# Patient Record
Sex: Female | Born: 1935 | Race: White | Hispanic: No | Marital: Married | Smoking: Former smoker
Health system: Southern US, Community
[De-identification: ages and names within clinical notes are randomized; demographics above are authoritative.]

## PROBLEM LIST (undated history)

## (undated) DIAGNOSIS — I341 Nonrheumatic mitral (valve) prolapse: Secondary | ICD-10-CM

## (undated) DIAGNOSIS — R002 Palpitations: Secondary | ICD-10-CM

## (undated) DIAGNOSIS — N200 Calculus of kidney: Secondary | ICD-10-CM

## (undated) DIAGNOSIS — I491 Atrial premature depolarization: Secondary | ICD-10-CM

## (undated) DIAGNOSIS — M25511 Pain in right shoulder: Secondary | ICD-10-CM

## (undated) DIAGNOSIS — E785 Hyperlipidemia, unspecified: Secondary | ICD-10-CM

## (undated) DIAGNOSIS — K5790 Diverticulosis of intestine, part unspecified, without perforation or abscess without bleeding: Secondary | ICD-10-CM

## (undated) DIAGNOSIS — K648 Other hemorrhoids: Secondary | ICD-10-CM

## (undated) DIAGNOSIS — R3915 Urgency of urination: Secondary | ICD-10-CM

## (undated) DIAGNOSIS — R350 Frequency of micturition: Secondary | ICD-10-CM

## (undated) DIAGNOSIS — Z87442 Personal history of urinary calculi: Secondary | ICD-10-CM

## (undated) DIAGNOSIS — D126 Benign neoplasm of colon, unspecified: Secondary | ICD-10-CM

## (undated) DIAGNOSIS — R42 Dizziness and giddiness: Secondary | ICD-10-CM

## (undated) DIAGNOSIS — I10 Essential (primary) hypertension: Secondary | ICD-10-CM

## (undated) HISTORY — DX: Palpitations: R00.2

## (undated) HISTORY — DX: Nonrheumatic mitral (valve) prolapse: I34.1

## (undated) HISTORY — DX: Diverticulosis of intestine, part unspecified, without perforation or abscess without bleeding: K57.90

## (undated) HISTORY — PX: NEPHROLITHOTOMY: SHX5134

## (undated) HISTORY — PX: REFRACTIVE SURGERY: SHX103

## (undated) HISTORY — DX: Benign neoplasm of colon, unspecified: D12.6

## (undated) HISTORY — PX: ROTATOR CUFF REPAIR: SHX139

## (undated) HISTORY — DX: Essential (primary) hypertension: I10

## (undated) HISTORY — DX: Other hemorrhoids: K64.8

## (undated) HISTORY — DX: Hyperlipidemia, unspecified: E78.5

---
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
## 1983-12-04 HISTORY — PX: VAGINAL HYSTERECTOMY: SUR661

## 1993-12-03 HISTORY — PX: BUNIONECTOMY: SHX129

## 2003-11-23 ENCOUNTER — Ambulatory Visit (HOSPITAL_COMMUNITY): Admission: RE | Admit: 2003-11-23 | Discharge: 2003-11-23 | Payer: Self-pay | Admitting: Gastroenterology

## 2004-12-21 ENCOUNTER — Ambulatory Visit: Payer: Self-pay | Admitting: Internal Medicine

## 2005-01-11 ENCOUNTER — Ambulatory Visit: Payer: Self-pay | Admitting: Internal Medicine

## 2005-07-30 ENCOUNTER — Ambulatory Visit: Payer: Self-pay | Admitting: Internal Medicine

## 2005-12-05 ENCOUNTER — Ambulatory Visit: Payer: Self-pay | Admitting: Cardiology

## 2005-12-13 ENCOUNTER — Ambulatory Visit: Payer: Self-pay

## 2006-04-10 ENCOUNTER — Ambulatory Visit: Payer: Self-pay | Admitting: Internal Medicine

## 2007-04-29 ENCOUNTER — Ambulatory Visit: Payer: Self-pay | Admitting: Internal Medicine

## 2008-04-29 ENCOUNTER — Ambulatory Visit: Payer: Self-pay | Admitting: Internal Medicine

## 2008-08-05 ENCOUNTER — Ambulatory Visit: Payer: Self-pay | Admitting: Family Medicine

## 2008-08-06 ENCOUNTER — Ambulatory Visit: Payer: Self-pay | Admitting: Internal Medicine

## 2009-05-04 ENCOUNTER — Ambulatory Visit: Payer: Self-pay | Admitting: Internal Medicine

## 2010-06-01 ENCOUNTER — Ambulatory Visit: Payer: Self-pay | Admitting: Internal Medicine

## 2010-06-26 ENCOUNTER — Telehealth: Payer: Self-pay | Admitting: Cardiology

## 2010-07-31 DIAGNOSIS — E785 Hyperlipidemia, unspecified: Secondary | ICD-10-CM | POA: Insufficient documentation

## 2010-07-31 DIAGNOSIS — R079 Chest pain, unspecified: Secondary | ICD-10-CM | POA: Insufficient documentation

## 2010-07-31 DIAGNOSIS — I341 Nonrheumatic mitral (valve) prolapse: Secondary | ICD-10-CM

## 2010-07-31 DIAGNOSIS — R002 Palpitations: Secondary | ICD-10-CM | POA: Insufficient documentation

## 2010-07-31 DIAGNOSIS — I10 Essential (primary) hypertension: Secondary | ICD-10-CM | POA: Insufficient documentation

## 2010-08-02 ENCOUNTER — Ambulatory Visit: Payer: Self-pay | Admitting: Cardiology

## 2010-08-24 ENCOUNTER — Encounter: Payer: Self-pay | Admitting: Cardiology

## 2010-08-24 ENCOUNTER — Ambulatory Visit: Payer: Self-pay

## 2011-01-02 NOTE — Progress Notes (Signed)
Summary: irregular heartbeat off and on  Phone Note Call from Patient   Caller: Patient Reason for Call: Talk to Nurse Summary of Call: pt having irregular heartbeats off and on this weekend-none today but concerned-pls call [phone] or [phone] Initial call taken by: Glynda Jaeger,  June 26, 2010 9:48 AM  Follow-up for Phone Call        PER PT SAW DR Jaceion Aday IN 2007 FOR MVP . PER PT HAS HAD PALPITATIONS WHICH IS NEW . NOTICES WHEN SITTING DOWN  NEW PROBLEM INSTRUCTED TO CALL PMD  FOR F/U AND POSSIBLE LAB WORK.APPT MADE WITH DR English Tomer FOR 08/02/10 12:15  Follow-up by: Scherrie Bateman, LPN,  June 26, 2010 5:32 PM  Additional Follow-up for Phone Call Additional follow up Details #1::        ok Additional Follow-up by: Gaylord Shih, MD, [facility],  June 28, 2010 1:46 PM

## 2011-01-02 NOTE — Assessment & Plan Note (Signed)
Summary: rov c/o palpitations  hx of mvp seen 2007/cy   Visit Type:  new pt visit Primary Provider:  Kathe Becton  CC:  palpitations that last 2-3 days as per pt.Marland KitchenMarland KitchenMarland KitchenMarland Kitchenpt states she gets anxious when these episodes happen....denies any cp or edema.  History of Present Illness: Amy Gilmore comes in today for consultation concerning palpitations and the need for a stress test.  I saw her initially in January of 2007. She has a long history of mitral valve prolapse it was diagnosed at [facility] over 30 years ago. He's had some palpitations and chest pain in the past which have responded well to Inderal. She has been on this medication at low dose 60 mg for a long time.  About 3 times a year, she'll have a bad day where she has prolonged palpitations. She notices it off and on but mostly with rest on those days.  She is very active and walks on a regular basis. She denies any increased palpitations with exertion, presyncope, syncope, or chest pain.  She has had blood work recently including her thyroid which were normal.  We performed a stress test for some chest discomfort in 2007 which showed her to have good exercise tolerance. She had an EF of 81% with no ischemia and normal contractility of all areas of her myocardium. EKG was unremarkable.    Current Medications (verified): 1)  Inderal La 60 Mg Xr24h-Cap (Propranolol Hcl) .Marland Kitchen.. 1 Cap Once Daily 2)  Diovan Hct 160-12.5 Mg Tabs (Valsartan-Hydrochlorothiazide) .Marland Kitchen.. 1 Tab Once Daily 3)  Lipitor 10 Mg Tabs (Atorvastatin Calcium) .Marland Kitchen.. 1 Tab At Bedtime 4)  Aspirin 81 Mg Tbec (Aspirin) .... Take One Tablet By Mouth Daily 5)  Caltrate 600+d 600-400 Mg-Unit Tabs (Calcium Carbonate-Vitamin D) .Marland Kitchen.. 1 Tab Two Times A Day 6)  Fish Oil 1000 Mg Caps (Omega-3 Fatty Acids) .Marland Kitchen.. 1 Caps Once Daily 7)  Glucosamine 500 Mg Caps (Glucosamine Sulfate) .Marland Kitchen.. 1 Tab Once Daily 8)  Estradiol 1 Mg Tabs (Estradiol) .Marland Kitchen.. 1 Tab Once Daily 9)  Centrum Silver  Tabs  (Multiple Vitamins-Minerals) .Marland Kitchen.. 1 Tab Once Daily  Allergies: 1)  ! Codeine 2)  ! Morphine  Past History:  Past Medical History: Last updated: 07/31/2010 HYPERTENSION (ICD-401.9) HYPERLIPIDEMIA-MIXED (ICD-272.4) CHEST PAIN (ICD-786.50) PALPITATIONS (ICD-785.1) MITRAL VALVE PROLAPSE, HX OF (ICD-V12.50)    Past Surgical History: Last updated: 07/31/2010 Appendectomy Rotator Cuff Repair Hysterectomy Bunion removal  Family History: Last updated: 07/31/2010 Siblings: Sister found deceased ? heart attack. Sister also had PVD. 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  quit 1980 Alcohol Use - yes..socially Regular Exercise - yes Drug Use - no  Risk Factors: Exercise: yes (07/31/2010)  Risk Factors: Smoking Status: quit (07/31/2010)  Review of Systems       negative other than history of present illness  Vital Signs:  Patient profile:   75 year old female Height:      64 inches Weight:      140 pounds BMI:     24.12 Pulse rate:   59 / minute Pulse rhythm:   irregular BP sitting:   142 / 80  (left arm) Cuff size:   small  Vitals Entered By: Danielle Rankin, CMA (August 02, 2010 12:11 PM)  Physical Exam  General:  Well developed, well nourished, in no  acute distress. Head:  normocephalic and atraumatic Eyes:  PERRLA/EOM intact; conjunctiva and lids normal. Neck:  Neck supple, no JVD. No masses, thyromegaly or abnormal cervical nodes. Chest Wall:  no deformities or breast masses noted Lungs:  Clear bilaterally to auscultation and percussion. Heart:  PMI nondisplaced, mid systolic murmur consistent with mitral valve prolapse, intermittent click and, no gallop, regular rate and rhythm, split S2, pressures equal bilaterally without bruits Abdomen:  Bowel  sounds positive; abdomen soft and non-tender without masses, organomegaly, or hernias noted. No hepatosplenomegaly. Msk:  Back normal, normal gait. Muscle strength and tone normal. Pulses:  pulses normal in all 4 extremities Extremities:  No clubbing or cyanosis. Neurologic:  Alert and oriented x 3. Skin:  Intact without lesions or rashes. Psych:  Normal affect.   EKG  Procedure date:  08/02/2010  Findings:      sinus bradycardia, nonspecific changes, essentially normal EKG.  Impression & Recommendations:  Problem # 1:  PALPITATIONS (ICD-785.1) I suspect these are benign and most likely ectopic beats. Since they only occur 2-3 times a year, the likelihood of capturing down on a monitor is unlikely. I think a repeat echocardiogram would be helpful to assure [place] that her her labs and LV function are stable. In addition I have added short-acting diltiazem 30 mg one to 2 every 8 hours p.r.n. I do not think a stress test is indicated. Her updated medication list for this problem includes:    Inderal La 60 Mg Xr24h-cap (Propranolol hcl) .Marland Kitchen... 1 cap once daily    Aspirin 81 Mg Tbec (Aspirin) .Marland Kitchen... Take one tablet by mouth daily    Diltiazem Hcl 30 Mg Tabs (Diltiazem hcl) .Marland Kitchen... Take 1-2 tablets by mouth every 8 hours as needed for  increasing palptiations  Problem # 2:  HYPERTENSION (ICD-401.9)  Her updated medication list for this problem includes:    Inderal La 60 Mg Xr24h-cap (Propranolol hcl) .Marland Kitchen... 1 cap once daily    Diovan Hct 160-12.5 Mg Tabs (Valsartan-hydrochlorothiazide) .Marland Kitchen... 1 tab once daily    Aspirin 81 Mg Tbec (Aspirin) .Marland Kitchen... Take one tablet by mouth daily    Diltiazem Hcl 30 Mg Tabs (Diltiazem hcl) .Marland Kitchen... Take 1-2 tablets by mouth every 8 hours as needed for  increasing palptiations  Her updated medication list for this problem includes:    Inderal La 60 Mg Xr24h-cap (Propranolol hcl) .Marland Kitchen... 1 cap once daily    Diovan Hct 160-12.5 Mg Tabs (Valsartan-hydrochlorothiazide) .Marland Kitchen...  1 tab once daily    Aspirin 81 Mg Tbec (Aspirin) .Marland Kitchen... Take one tablet by mouth daily  Problem # 3:  HYPERLIPIDEMIA-MIXED (ICD-272.4)  Her updated medication list for this problem includes:    Lipitor 10 Mg Tabs (Atorvastatin calcium) .Marland Kitchen... 1 tab at bedtime  Her updated medication list for this problem includes:    Lipitor 10 Mg Tabs (Atorvastatin calcium) .Marland Kitchen... 1 tab at bedtime  Problem # 4:  MITRAL VALVE PROLAPSE, HX OF (ICD-V12.50)  Orders: EKG w/ Interpretation (93000) Echocardiogram (Echo)  Problem # 5:  CHEST PAIN (ICD-786.50) Assessment: Improved  Her updated medication list for this problem includes:    Inderal La 60 Mg Xr24h-cap (Propranolol hcl) .Marland Kitchen... 1 cap once daily    Aspirin 81 Mg Tbec (Aspirin) .Marland Kitchen... Take one tablet by mouth daily    Diltiazem Hcl 30 Mg Tabs (Diltiazem hcl) .Marland Kitchen... Take 1-2 tablets by mouth every 8 hours as needed for  increasing palptiations  Her updated medication list for this problem includes:  Inderal La 60 Mg Xr24h-cap (Propranolol hcl) .Marland Kitchen... 1 cap once daily    Aspirin 81 Mg Tbec (Aspirin) .Marland Kitchen... Take one tablet by mouth daily  Patient Instructions: 1)  Your physician recommends that you schedule a follow-up appointment in: 1 year with Dr. Daleen Squibb 2)  Your physician has requested that you have an echocardiogram.  Echocardiography is a painless test that uses sound waves to create images of your heart. It provides your doctor with information about the size and shape of your heart and how well your heart's chambers and valves are working.  This procedure takes approximately one hour. There are no restrictions for this procedure. 3)  Your physician has recommended you make the following change in your medication: Diltiazem  for palpitations as directed Prescriptions: DILTIAZEM HCL 30 MG TABS (DILTIAZEM HCL) Take 1-2 tablets by mouth every 8 hours as needed for  increasing palptiations  #12 x 1   Entered by:   Lisabeth Devoid RN   Authorized by:   Gaylord Shih, MD, [facility]   Signed by:   Lisabeth Devoid RN on 08/02/2010   Method used:   Electronically to        [id number] (retail)       [address]       [place], [place]  [address]       Ph: [phone]       Fax: [phone]   RxID:   [id number]

## 2011-01-15 ENCOUNTER — Other Ambulatory Visit: Payer: Self-pay | Admitting: Orthopaedic Surgery

## 2011-01-15 DIAGNOSIS — M545 Low back pain: Secondary | ICD-10-CM

## 2011-01-20 ENCOUNTER — Ambulatory Visit
Admission: RE | Admit: 2011-01-20 | Discharge: 2011-01-20 | Disposition: A | Discharge status: Home / Self Care | Payer: PRIVATE HEALTH INSURANCE | Source: Ambulatory Visit | Attending: Orthopaedic Surgery | Admitting: Orthopaedic Surgery

## 2011-01-20 DIAGNOSIS — M545 Low back pain, unspecified: Secondary | ICD-10-CM

## 2011-02-22 ENCOUNTER — Telehealth: Payer: Self-pay | Admitting: Gastroenterology

## 2011-02-23 NOTE — Telephone Encounter (Signed)
2014

## 2011-02-23 NOTE — Telephone Encounter (Signed)
Patient wants to know if she is due for her colon. Chart ordered and placed on Dr. Marzetta Board desk for review. Dr. Arlyce Dice please advise.

## 2011-02-23 NOTE — Telephone Encounter (Signed)
Dr. Arlyce Dice please advise when next colon due.

## 2011-06-11 ENCOUNTER — Ambulatory Visit: Payer: Self-pay | Admitting: Internal Medicine

## 2011-07-03 ENCOUNTER — Encounter: Payer: Self-pay | Admitting: Cardiology

## 2011-08-14 ENCOUNTER — Ambulatory Visit (INDEPENDENT_AMBULATORY_CARE_PROVIDER_SITE_OTHER): Payer: Medicare Other | Admitting: Cardiology

## 2011-08-14 ENCOUNTER — Encounter: Payer: Self-pay | Admitting: Cardiology

## 2011-08-14 VITALS — BP 121/78 | HR 74 | Resp 12 | Ht 64.0 in | Wt 139.0 lb

## 2011-08-14 DIAGNOSIS — Z8679 Personal history of other diseases of the circulatory system: Secondary | ICD-10-CM

## 2011-08-14 DIAGNOSIS — R002 Palpitations: Secondary | ICD-10-CM

## 2011-08-14 DIAGNOSIS — I10 Essential (primary) hypertension: Secondary | ICD-10-CM

## 2011-08-14 DIAGNOSIS — R079 Chest pain, unspecified: Secondary | ICD-10-CM

## 2011-08-14 DIAGNOSIS — I059 Rheumatic mitral valve disease, unspecified: Secondary | ICD-10-CM

## 2011-08-14 NOTE — Assessment & Plan Note (Signed)
Infrequent despite having lots of PACs. Reassurance given. No change in treatment.

## 2011-08-14 NOTE — Progress Notes (Signed)
HPI Amy Gilmore comes in today for evaluation and management of her history of palpitations mitral valve prolapse. We've also evaluated her in the past for her hypertension.  Other than occasional palpitations, he is having no symptoms. He is very compliant with her diet and her meds. Her blood pressure is usually under good control. She's had some occasional headaches which do not sound related to her hypertension.  Her EKG today shows normal sinus rhythm with frequent PACs in a bigeminal pattern. She  Is not complaining of palpitations. Past Medical History  Diagnosis Date  . Hypertension   . Hyperlipidemia   . Chest pain   . Palpitations   . Mitral valve prolapse     Past Surgical History  Procedure Date  . Appendectomy   . Rotator cuff repair   . Abdominal hysterectomy   . Bunionectomy     Family History  Problem Relation Age of Onset  . Liver cancer Mother   . Heart attack Sister     ?  . Peripheral vascular disease Sister     History   Social History  . Marital Status: Single    Spouse Name: N/A    Number of Children: 3  . Years of Education: N/A   Occupational History  . Housewife    Social History Main Topics  . Smoking status: Former Smoker    Quit date: 12/03/1978  . Smokeless tobacco: Not on file  . Alcohol Use: Yes     Socially  . Drug Use: No  . Sexually Active:    Other Topics Concern  . Not on file   Social History Narrative   Married.Gets regular exercise.    Allergies  Allergen Reactions  . Codeine   . Morphine     Current Outpatient Prescriptions  Medication Sig Dispense Refill  . aspirin 81 MG EC tablet Take 81 mg by mouth daily.        Marland Kitchen atorvastatin (LIPITOR) 10 MG tablet Take 10 mg by mouth at bedtime.        . Calcium Carbonate-Vitamin D (CALTRATE 600+D) 600-400 MG-UNIT per tablet Take 1 tablet by mouth 2 (two) times daily.        Marland Kitchen diltiazem (CARDIZEM) 30 MG tablet Take by mouth every 8 (eight) hours as needed. Take  1-2 tablets (30-60mg ) for increasing palpitations.       . Glucosamine 500 MG CAPS Take 500 mg by mouth daily.        . meclizine (ANTIVERT) 25 MG tablet as needed.      . Multiple Vitamins-Minerals (CENTRUM SILVER) tablet Take 1 tablet by mouth daily.        . Omega-3 Fatty Acids (FISH OIL) 1000 MG CAPS Take 1,000 mg by mouth daily.        . propranolol (INDERAL LA) 60 MG 24 hr capsule Take 60 mg by mouth daily.        . valsartan-hydrochlorothiazide (DIOVAN-HCT) 160-12.5 MG per tablet Take 1 tablet by mouth daily.          ROS Negative other than HPI.   PE General Appearance: well developed, well nourished in no acute distress HEENT: symmetrical face, PERRLA, good dentition  Neck: no JVD, thyromegaly, or adenopathy, trachea midline Chest: symmetric without deformity Cardiac: PMI non-displaced, RRR, normal S1, S2, no gallop, midsystolic click Lung: clear to ausculation and percussion Vascular: all pulses full without bruits  Abdominal: nondistended, nontender, good bowel sounds, no HSM, no bruits Extremities: no cyanosis, clubbing or  edema, no sign of DVT, no varicosities  Skin: normal color, no rashes Neuro: alert and oriented x 3, non-focal Pysch: normal affect Filed Vitals:   08/14/11 1021  BP: 121/78  Pulse: 74  Resp: 12  Height: 5\' 4"  (1.626 m)  Weight: 139 lb (63.05 kg)    EKG  Labs and Studies Reviewed.   No results found for this basename: WBC, HGB, HCT, MCV, PLT      Chemistry   No results found for this basename: NA, K, CL, CO2, BUN, CREATININE, GLU   No results found for this basename: CALCIUM, ALKPHOS, AST, ALT, BILITOT       No results found for this basename: CHOL   No results found for this basename: HDL   No results found for this basename: LDLCALC   No results found for this basename: TRIG   No results found for this basename: CHOLHDL   No results found for this basename: HGBA1C   No results found for this basename: ALT, AST, GGT, ALKPHOS,  BILITOT   No results found for this basename: TSH

## 2011-08-14 NOTE — Assessment & Plan Note (Signed)
Stable

## 2011-08-14 NOTE — Assessment & Plan Note (Signed)
Improved

## 2011-08-14 NOTE — Patient Instructions (Signed)
Your physician recommends that you schedule a follow-up appointment in: 1 year with Dr. Wall  

## 2011-12-17 DIAGNOSIS — S01501A Unspecified open wound of lip, initial encounter: Secondary | ICD-10-CM | POA: Diagnosis not present

## 2011-12-25 DIAGNOSIS — N390 Urinary tract infection, site not specified: Secondary | ICD-10-CM | POA: Diagnosis not present

## 2011-12-25 DIAGNOSIS — N200 Calculus of kidney: Secondary | ICD-10-CM | POA: Diagnosis not present

## 2011-12-26 ENCOUNTER — Other Ambulatory Visit: Payer: Self-pay | Admitting: Urology

## 2011-12-26 DIAGNOSIS — M25579 Pain in unspecified ankle and joints of unspecified foot: Secondary | ICD-10-CM | POA: Diagnosis not present

## 2011-12-26 DIAGNOSIS — N200 Calculus of kidney: Secondary | ICD-10-CM

## 2011-12-26 DIAGNOSIS — M159 Polyosteoarthritis, unspecified: Secondary | ICD-10-CM | POA: Diagnosis not present

## 2012-01-11 ENCOUNTER — Other Ambulatory Visit: Payer: Self-pay | Admitting: Radiology

## 2012-01-11 DIAGNOSIS — E782 Mixed hyperlipidemia: Secondary | ICD-10-CM | POA: Diagnosis not present

## 2012-01-11 DIAGNOSIS — M159 Polyosteoarthritis, unspecified: Secondary | ICD-10-CM | POA: Diagnosis not present

## 2012-01-11 DIAGNOSIS — I10 Essential (primary) hypertension: Secondary | ICD-10-CM | POA: Diagnosis not present

## 2012-01-18 DIAGNOSIS — S025XXA Fracture of tooth (traumatic), initial encounter for closed fracture: Secondary | ICD-10-CM | POA: Diagnosis not present

## 2012-01-18 DIAGNOSIS — S01501A Unspecified open wound of lip, initial encounter: Secondary | ICD-10-CM | POA: Diagnosis not present

## 2012-01-21 ENCOUNTER — Encounter (HOSPITAL_COMMUNITY): Payer: Self-pay | Admitting: Pharmacy Technician

## 2012-01-22 ENCOUNTER — Other Ambulatory Visit: Payer: Self-pay | Admitting: Physician Assistant

## 2012-01-23 ENCOUNTER — Encounter (HOSPITAL_COMMUNITY)
Admission: RE | Admit: 2012-01-23 | Discharge: 2012-01-23 | Disposition: A | Discharge status: Home / Self Care | Payer: Medicare Other | Source: Ambulatory Visit | Attending: Urology | Admitting: Urology

## 2012-01-23 ENCOUNTER — Other Ambulatory Visit: Payer: Self-pay

## 2012-01-23 ENCOUNTER — Encounter (HOSPITAL_COMMUNITY): Payer: Self-pay

## 2012-01-23 ENCOUNTER — Ambulatory Visit (HOSPITAL_COMMUNITY)
Admission: RE | Admit: 2012-01-23 | Discharge: 2012-01-23 | Disposition: A | Discharge status: Home / Self Care | Payer: Medicare Other | Source: Ambulatory Visit | Attending: Urology | Admitting: Urology

## 2012-01-23 DIAGNOSIS — Z01811 Encounter for preprocedural respiratory examination: Secondary | ICD-10-CM | POA: Insufficient documentation

## 2012-01-23 DIAGNOSIS — I10 Essential (primary) hypertension: Secondary | ICD-10-CM | POA: Diagnosis not present

## 2012-01-23 DIAGNOSIS — Z01812 Encounter for preprocedural laboratory examination: Secondary | ICD-10-CM | POA: Insufficient documentation

## 2012-01-23 DIAGNOSIS — Z01810 Encounter for preprocedural cardiovascular examination: Secondary | ICD-10-CM | POA: Diagnosis not present

## 2012-01-23 DIAGNOSIS — R002 Palpitations: Secondary | ICD-10-CM | POA: Insufficient documentation

## 2012-01-23 DIAGNOSIS — N200 Calculus of kidney: Secondary | ICD-10-CM | POA: Insufficient documentation

## 2012-01-23 DIAGNOSIS — Z01818 Encounter for other preprocedural examination: Secondary | ICD-10-CM | POA: Diagnosis not present

## 2012-01-23 DIAGNOSIS — IMO0002 Reserved for concepts with insufficient information to code with codable children: Secondary | ICD-10-CM | POA: Insufficient documentation

## 2012-01-23 HISTORY — DX: Pain in right shoulder: M25.511

## 2012-01-23 LAB — CBC
MCH: 32.7 pg (ref 26.0–34.0)
MCV: 96.5 fL (ref 78.0–100.0)
Platelets: 262 10*3/uL (ref 150–400)
RDW: 13 % (ref 11.5–15.5)
WBC: 6.9 10*3/uL (ref 4.0–10.5)

## 2012-01-23 LAB — APTT: aPTT: 33 seconds (ref 24–37)

## 2012-01-23 LAB — BASIC METABOLIC PANEL
CO2: 30 mEq/L (ref 19–32)
Calcium: 9.8 mg/dL (ref 8.4–10.5)
Creatinine, Ser: 0.66 mg/dL (ref 0.50–1.10)
GFR calc non Af Amer: 86 mL/min — ABNORMAL LOW (ref >90)
Glucose, Bld: 97 mg/dL (ref 70–99)
Sodium: 137 mEq/L (ref 135–145)

## 2012-01-23 LAB — SURGICAL PCR SCREEN
MRSA, PCR: NEGATIVE
Staphylococcus aureus: NEGATIVE

## 2012-01-23 LAB — PROTIME-INR: Prothrombin Time: 13.1 seconds (ref 11.6–15.2)

## 2012-01-23 NOTE — Patient Instructions (Signed)
20 RASHEEDAH REIS  01/23/2012   Your procedure is scheduled on:    Report to [facility] Radiology at  715 AM.  Call this number if you have problems the morning of surgery: [phone]   Remember:   Do not eat food or drink liquids:After Midnight.  .  Take these medicines the morning of surgery with A SIP OF WATER: cardizem if needed, genteal eye drop if needed, inderal   Do not wear jewelry or make up.  Do not wear lotions, powders, or perfumes.Do not wear deodorant.    Do not bring valuables to the hospital.  Contacts, dentures or bridgework may not be worn into surgery.  Leave suitcase in the car. After surgery it may be brought to your room.  For patients admitted to the hospital, checkout time is 11:00 AM the day of discharge.     Special Instructions: CHG Shower Use Special Wash: 1/2 bottle night before surgery and 1/2 bottle morning of surgery.neck down avoid private area, do not shave 2 days before showers   Please read over the following fact sheets that you were given: MRSA Information  Cain S[facility] pre op nurse phone number [phone], call if needed

## 2012-01-23 NOTE — Pre-Procedure Instructions (Addendum)
lov note dr wall 08-14-11 in epic 08-24-10 echo in epic

## 2012-01-25 ENCOUNTER — Telehealth (HOSPITAL_COMMUNITY): Payer: Self-pay | Admitting: Urology

## 2012-01-25 ENCOUNTER — Other Ambulatory Visit: Payer: Self-pay | Admitting: Radiology

## 2012-01-28 ENCOUNTER — Ambulatory Visit (HOSPITAL_COMMUNITY)
Admission: RE | Admit: 2012-01-28 | Discharge: 2012-01-29 | Disposition: A | Discharge status: Home / Self Care | Payer: Medicare Other | Source: Ambulatory Visit | Attending: Urology | Admitting: Urology

## 2012-01-28 ENCOUNTER — Ambulatory Visit (HOSPITAL_COMMUNITY): Payer: Medicare Other

## 2012-01-28 ENCOUNTER — Encounter (HOSPITAL_COMMUNITY): Payer: Self-pay | Admitting: Anesthesiology

## 2012-01-28 ENCOUNTER — Ambulatory Visit (HOSPITAL_COMMUNITY): Payer: Medicare Other | Admitting: Anesthesiology

## 2012-01-28 ENCOUNTER — Ambulatory Visit (HOSPITAL_COMMUNITY)
Admission: RE | Admit: 2012-01-28 | Discharge: 2012-01-28 | Disposition: A | Discharge status: Home / Self Care | Payer: Medicare Other | Source: Ambulatory Visit | Attending: Urology | Admitting: Urology

## 2012-01-28 ENCOUNTER — Encounter (HOSPITAL_COMMUNITY)
Admission: RE | Disposition: A | Discharge status: Home / Self Care | Payer: Self-pay | Source: Ambulatory Visit | Attending: Urology

## 2012-01-28 ENCOUNTER — Other Ambulatory Visit: Payer: Self-pay | Admitting: Urology

## 2012-01-28 VITALS — BP 137/87 | HR 65 | Temp 97.6°F | Resp 12

## 2012-01-28 DIAGNOSIS — E785 Hyperlipidemia, unspecified: Secondary | ICD-10-CM | POA: Diagnosis not present

## 2012-01-28 DIAGNOSIS — I10 Essential (primary) hypertension: Secondary | ICD-10-CM | POA: Diagnosis not present

## 2012-01-28 DIAGNOSIS — Z7982 Long term (current) use of aspirin: Secondary | ICD-10-CM | POA: Diagnosis not present

## 2012-01-28 DIAGNOSIS — N200 Calculus of kidney: Secondary | ICD-10-CM

## 2012-01-28 DIAGNOSIS — Z79899 Other long term (current) drug therapy: Secondary | ICD-10-CM | POA: Diagnosis not present

## 2012-01-28 DIAGNOSIS — B964 Proteus (mirabilis) (morganii) as the cause of diseases classified elsewhere: Secondary | ICD-10-CM | POA: Diagnosis not present

## 2012-01-28 DIAGNOSIS — N390 Urinary tract infection, site not specified: Secondary | ICD-10-CM | POA: Diagnosis not present

## 2012-01-28 HISTORY — PX: OTHER SURGICAL HISTORY: SHX169

## 2012-01-28 LAB — GLUCOSE, CAPILLARY: Glucose-Capillary: 121 mg/dL — ABNORMAL HIGH (ref 70–99)

## 2012-01-28 SURGERY — NEPHROLITHOTOMY PERCUTANEOUS
Anesthesia: General | Site: Flank | Laterality: Right | Wound class: Clean Contaminated

## 2012-01-28 MED ORDER — CIPROFLOXACIN IN D5W 400 MG/200ML IV SOLN
400.0000 mg | Freq: Once | INTRAVENOUS | Status: AC
  Administered 2012-01-28: 400 mg via INTRAVENOUS

## 2012-01-28 MED ORDER — HYDROMORPHONE HCL PF 1 MG/ML IJ SOLN
INTRAMUSCULAR | Status: AC
  Filled 2012-01-28: qty 1

## 2012-01-28 MED ORDER — SODIUM CHLORIDE 0.9 % IR SOLN
Status: DC | PRN
  Administered 2012-01-28: 6000 mL

## 2012-01-28 MED ORDER — OXYCODONE-ACETAMINOPHEN 5-325 MG PO TABS
1.0000 | ORAL_TABLET | ORAL | Status: DC | PRN
  Administered 2012-01-28 – 2012-01-29 (×4): 1 via ORAL
  Filled 2012-01-28: qty 1
  Filled 2012-01-28: qty 2
  Filled 2012-01-28: qty 1

## 2012-01-28 MED ORDER — GENTAMICIN IN SALINE 1.6-0.9 MG/ML-% IV SOLN: 80.0000 mg | INTRAVENOUS | Status: DC

## 2012-01-28 MED ORDER — EPHEDRINE SULFATE 50 MG/ML IJ SOLN
INTRAMUSCULAR | Status: DC | PRN
  Administered 2012-01-28: 5 mg via INTRAVENOUS

## 2012-01-28 MED ORDER — IOHEXOL 300 MG/ML  SOLN
INTRAMUSCULAR | Status: DC | PRN
  Administered 2012-01-28: 100 mL via INTRAVENOUS

## 2012-01-28 MED ORDER — HYDROMORPHONE HCL PF 1 MG/ML IJ SOLN: 0.5000 mg | INTRAMUSCULAR | Status: DC | PRN

## 2012-01-28 MED ORDER — SODIUM CHLORIDE 0.9 % IV SOLN: Freq: Once | INTRAVENOUS | Status: DC

## 2012-01-28 MED ORDER — PROMETHAZINE HCL 25 MG/ML IJ SOLN
6.2500 mg | INTRAMUSCULAR | Status: DC | PRN
  Administered 2012-01-28: 6.25 mg via INTRAVENOUS

## 2012-01-28 MED ORDER — ROCURONIUM BROMIDE 100 MG/10ML IV SOLN
INTRAVENOUS | Status: DC | PRN
  Administered 2012-01-28: 20 mg via INTRAVENOUS

## 2012-01-28 MED ORDER — SUCCINYLCHOLINE CHLORIDE 20 MG/ML IJ SOLN
INTRAMUSCULAR | Status: DC | PRN
  Administered 2012-01-28: 100 mg via INTRAVENOUS

## 2012-01-28 MED ORDER — ONDANSETRON HCL 4 MG/2ML IJ SOLN: 4.0000 mg | INTRAMUSCULAR | Status: DC | PRN

## 2012-01-28 MED ORDER — IOHEXOL 300 MG/ML  SOLN: 50.0000 mL | Freq: Once | INTRAMUSCULAR | Status: DC | PRN

## 2012-01-28 MED ORDER — HYDROMORPHONE HCL PF 1 MG/ML IJ SOLN
0.2500 mg | INTRAMUSCULAR | Status: DC | PRN
  Administered 2012-01-28 (×4): 0.5 mg via INTRAVENOUS

## 2012-01-28 MED ORDER — CIPROFLOXACIN IN D5W 400 MG/200ML IV SOLN
400.0000 mg | Freq: Two times a day (BID) | INTRAVENOUS | Status: DC
  Administered 2012-01-28 – 2012-01-29 (×2): 400 mg via INTRAVENOUS
  Filled 2012-01-28 (×4): qty 200

## 2012-01-28 MED ORDER — ZOLPIDEM TARTRATE 5 MG PO TABS: 5.0000 mg | ORAL_TABLET | Freq: Every evening | ORAL | Status: DC | PRN

## 2012-01-28 MED ORDER — PROPOFOL 10 MG/ML IV BOLUS
INTRAVENOUS | Status: DC | PRN
  Administered 2012-01-28: 140 mg via INTRAVENOUS

## 2012-01-28 MED ORDER — LACTATED RINGERS IV SOLN
INTRAVENOUS | Status: DC | PRN
  Administered 2012-01-28 (×2): via INTRAVENOUS

## 2012-01-28 MED ORDER — HEMOSTATIC AGENTS (NO CHARGE) OPTIME
TOPICAL | Status: DC | PRN
  Administered 2012-01-28: 1

## 2012-01-28 MED ORDER — FENTANYL CITRATE 0.05 MG/ML IJ SOLN
INTRAMUSCULAR | Status: AC | PRN
  Administered 2012-01-28: 100 ug via INTRAVENOUS

## 2012-01-28 MED ORDER — ACETAMINOPHEN 10 MG/ML IV SOLN
1000.0000 mg | Freq: Four times a day (QID) | INTRAVENOUS | Status: AC
  Administered 2012-01-28 – 2012-01-29 (×4): 1000 mg via INTRAVENOUS
  Filled 2012-01-28 (×3): qty 100

## 2012-01-28 MED ORDER — HYOSCYAMINE SULFATE 0.125 MG SL SUBL
0.1250 mg | SUBLINGUAL_TABLET | SUBLINGUAL | Status: DC | PRN
  Filled 2012-01-28: qty 1

## 2012-01-28 MED ORDER — ONDANSETRON HCL 4 MG/2ML IJ SOLN
INTRAMUSCULAR | Status: DC | PRN
  Administered 2012-01-28: 4 mg via INTRAVENOUS

## 2012-01-28 MED ORDER — DEXAMETHASONE SODIUM PHOSPHATE 10 MG/ML IJ SOLN
INTRAMUSCULAR | Status: DC | PRN
  Administered 2012-01-28: 10 mg via INTRAVENOUS

## 2012-01-28 MED ORDER — PROMETHAZINE HCL 25 MG/ML IJ SOLN
INTRAMUSCULAR | Status: AC
  Filled 2012-01-28: qty 1

## 2012-01-28 MED ORDER — MIDAZOLAM HCL 5 MG/5ML IJ SOLN
INTRAMUSCULAR | Status: AC | PRN
  Administered 2012-01-28 (×2): 2 mg via INTRAVENOUS

## 2012-01-28 MED ORDER — LIDOCAINE HCL (CARDIAC) 20 MG/ML IV SOLN
INTRAVENOUS | Status: DC | PRN
  Administered 2012-01-28: 50 mg via INTRAVENOUS

## 2012-01-28 MED ORDER — GLYCOPYRROLATE 0.2 MG/ML IJ SOLN
INTRAMUSCULAR | Status: DC | PRN
  Administered 2012-01-28: 0.2 mg via INTRAVENOUS
  Administered 2012-01-28: .3 mg via INTRAVENOUS

## 2012-01-28 MED ORDER — FENTANYL CITRATE 0.05 MG/ML IJ SOLN
INTRAMUSCULAR | Status: DC | PRN
  Administered 2012-01-28: 100 ug via INTRAVENOUS

## 2012-01-28 MED ORDER — CEFAZOLIN SODIUM 1-5 GM-% IV SOLN: 1.0000 g | INTRAVENOUS | Status: DC

## 2012-01-28 MED ORDER — IOHEXOL 300 MG/ML  SOLN
INTRAMUSCULAR | Status: AC
  Filled 2012-01-28: qty 2

## 2012-01-28 MED ORDER — SODIUM CHLORIDE 0.9 % IV SOLN
INTRAVENOUS | Status: DC
  Administered 2012-01-28: 13:00:00 via INTRAVENOUS

## 2012-01-28 MED ORDER — NEOSTIGMINE METHYLSULFATE 1 MG/ML IJ SOLN
INTRAMUSCULAR | Status: DC | PRN
  Administered 2012-01-28: 2 mg via INTRAVENOUS

## 2012-01-28 MED ORDER — ACETAMINOPHEN 10 MG/ML IV SOLN
INTRAVENOUS | Status: AC
  Filled 2012-01-28: qty 100

## 2012-01-28 SURGICAL SUPPLY — 55 items
APL ESCP 34 STRL LF DISP (HEMOSTASIS) ×2
APL SKNCLS STERI-STRIP NONHPOA (GAUZE/BANDAGES/DRESSINGS) ×4
APPLICATOR SURGIFLO ENDO (HEMOSTASIS) ×4 IMPLANT
BAG URINE DRAINAGE (UROLOGICAL SUPPLIES) ×4 IMPLANT
BASKET ZERO TIP NITINOL 2.4FR (BASKET) IMPLANT
BENZOIN TINCTURE PRP APPL 2/3 (GAUZE/BANDAGES/DRESSINGS) ×8 IMPLANT
BLADE SURG 15 STRL LF DISP TIS (BLADE) ×1 IMPLANT
BLADE SURG 15 STRL SS (BLADE) ×2
BSKT STON RTRVL ZERO TP 2.4FR (BASKET)
CATCHER STONE W/TUBE ADAPTER (UROLOGICAL SUPPLIES) ×1 IMPLANT
CATH FOLEY 2W COUNCIL 20FR 5CC (CATHETERS) IMPLANT
CATH FOLEY 2WAY SLVR  5CC 18FR (CATHETERS) ×1
CATH FOLEY 2WAY SLVR 5CC 18FR (CATHETERS) ×1 IMPLANT
CATH ROBINSON RED A/P 20FR (CATHETERS) IMPLANT
CATH URET 5FR 28IN OPEN ENDED (CATHETERS) ×2 IMPLANT
CATH X-FORCE N30 NEPHROSTOMY (TUBING) ×2 IMPLANT
CLOTH BEACON ORANGE TIMEOUT ST (SAFETY) ×2 IMPLANT
COVER SURGICAL LIGHT HANDLE (MISCELLANEOUS) ×2 IMPLANT
DRAPE C-ARM 42X72 X-RAY (DRAPES) ×2 IMPLANT
DRAPE CAMERA CLOSED 9X96 (DRAPES) ×2 IMPLANT
DRAPE LINGEMAN PERC (DRAPES) ×2 IMPLANT
DRAPE SURG IRRIG POUCH 19X23 (DRAPES) ×2 IMPLANT
DRSG PAD ABDOMINAL 8X10 ST (GAUZE/BANDAGES/DRESSINGS) ×4 IMPLANT
DRSG TEGADERM 8X12 (GAUZE/BANDAGES/DRESSINGS) ×4 IMPLANT
FLOSEAL 10ML (HEMOSTASIS) ×2 IMPLANT
GAUZE SPONGE 4X4 16PLY XRAY LF (GAUZE/BANDAGES/DRESSINGS) ×2 IMPLANT
GLOVE BIOGEL M 8.0 STRL (GLOVE) ×2 IMPLANT
GOWN STRL REIN XL XLG (GOWN DISPOSABLE) ×2 IMPLANT
GUIDEWIRE STR DUAL SENSOR (WIRE) ×4 IMPLANT
HOLDER FOLEY CATH W/STRAP (MISCELLANEOUS) ×2 IMPLANT
JUMPSUIT BLUE BOOT COVER DISP (PROTECTIVE WEAR) ×2 IMPLANT
KIT BASIN OR (CUSTOM PROCEDURE TRAY) ×2 IMPLANT
LASER FIBER DISP (UROLOGICAL SUPPLIES) IMPLANT
LASER FIBER DISP 1000U (UROLOGICAL SUPPLIES) IMPLANT
MANIFOLD NEPTUNE II (INSTRUMENTS) ×2 IMPLANT
NS IRRIG 1000ML POUR BTL (IV SOLUTION) ×2 IMPLANT
PACK BASIC VI WITH GOWN DISP (CUSTOM PROCEDURE TRAY) ×2 IMPLANT
PROBE LITHOCLAST ULTRA 3.8X403 (UROLOGICAL SUPPLIES) ×2 IMPLANT
PROBE PNEUMATIC 1.0MMX570MM (UROLOGICAL SUPPLIES) ×2 IMPLANT
SET IRRIG Y TYPE TUR BLADDER L (SET/KITS/TRAYS/PACK) ×2 IMPLANT
SET WARMING FLUID IRRIGATION (MISCELLANEOUS) ×2 IMPLANT
SPONGE GAUZE 4X4 12PLY (GAUZE/BANDAGES/DRESSINGS) ×2 IMPLANT
SPONGE LAP 4X18 X RAY DECT (DISPOSABLE) ×2 IMPLANT
STENT CONTOUR 6FRX24X.038 (STENTS) ×2 IMPLANT
STONE CATCHER W/TUBE ADAPTER (UROLOGICAL SUPPLIES) ×2 IMPLANT
SURGIFLO W/THROMBIN 8M KIT (HEMOSTASIS) ×2 IMPLANT
SUT MNCRL AB 4-0 PS2 18 (SUTURE) ×1 IMPLANT
SUT SILK 2 0 30  PSL (SUTURE) ×1
SUT SILK 2 0 30 PSL (SUTURE) ×1 IMPLANT
SYR 20CC LL (SYRINGE) ×4 IMPLANT
SYRINGE 10CC LL (SYRINGE) ×2 IMPLANT
TOWEL OR NON WOVEN STRL DISP B (DISPOSABLE) ×2 IMPLANT
TRAY FOLEY CATH 14FRSI W/METER (CATHETERS) ×2 IMPLANT
TRAY FOLEY CATH 16FRSI W/METER (SET/KITS/TRAYS/PACK) ×2 IMPLANT
TUBING CONNECTING 10 (TUBING) ×6 IMPLANT

## 2012-01-28 NOTE — Anesthesia Postprocedure Evaluation (Signed)
  Anesthesia Post-op Note  Patient: Amy Gilmore  Procedure(s) Performed: Procedure(s) (LRB): NEPHROLITHOTOMY PERCUTANEOUS (Right)  Patient Location: PACU  Anesthesia Type: General  Level of Consciousness: oriented and sedated  Airway and Oxygen Therapy: Patient Spontanous Breathing and Patient connected to nasal cannula oxygen  Post-op Pain: mild  Post-op Assessment: Post-op Vital signs reviewed, Patient's Cardiovascular Status Stable, Respiratory Function Stable and Patent Airway  Post-op Vital Signs: stable  Complications: No apparent anesthesia complications

## 2012-01-28 NOTE — Anesthesia Procedure Notes (Signed)
Procedure Name: Intubation Date/Time: 01/28/2012 10:15 AM Performed by: Joycie Peek Pre-anesthesia Checklist: Patient identified, Timeout performed, Emergency Drugs available, Suction available and Patient being monitored Patient Re-evaluated:Patient Re-evaluated prior to inductionOxygen Delivery Method: Circle system utilized Preoxygenation: Pre-oxygenation with 100% oxygen Intubation Type: IV induction Ventilation: Mask ventilation without difficulty Laryngoscope Size: Mac and 3 Grade View: Grade I Tube type: Oral Tube size: 7.0 mm Number of attempts: 1 Airway Equipment and Method: Stylet Placement Confirmation: ETT inserted through vocal cords under direct vision,  breath sounds checked- equal and bilateral and positive ETCO2 Secured at: 21 cm Tube secured with: Tape Dental Injury: Teeth and Oropharynx as per pre-operative assessment

## 2012-01-28 NOTE — Anesthesia Preprocedure Evaluation (Addendum)
Anesthesia Evaluation  Patient identified by MRN, date of birth, ID band Patient awake    Reviewed: Allergy & Precautions, H&P , NPO status , Patient's Chart, lab work & pertinent test results, reviewed documented beta blocker date and time   Airway Mallampati: II TM Distance: >3 FB Neck ROM: Full    Dental  (+) Teeth Intact and Dental Advisory Given   Pulmonary neg pulmonary ROS,  clear to auscultation        Cardiovascular hypertension, Pt. on medications Regular Normal Denies cardiac symptoms   Neuro/Psych Negative Neurological ROS  Negative Psych ROS   GI/Hepatic negative GI ROS, Neg liver ROS,   Endo/Other  Negative Endocrine ROSDiet control DM  Renal/GU Staghorn stone  Genitourinary negative   Musculoskeletal negative musculoskeletal ROS (+)   Abdominal   Peds negative pediatric ROS (+)  Hematology negative hematology ROS (+)   Anesthesia Other Findings Upper left incisor "hairline " crack  Reproductive/Obstetrics negative OB ROS                          Anesthesia Physical Anesthesia Plan  ASA: II  Anesthesia Plan: General   Post-op Pain Management:    Induction: Intravenous  Airway Management Planned: Oral ETT  Additional Equipment:   Intra-op Plan:   Post-operative Plan: Extubation in OR  Informed Consent: I have reviewed the patients History and Physical, chart, labs and discussed the procedure including the risks, benefits and alternatives for the proposed anesthesia with the patient or authorized representative who has indicated his/her understanding and acceptance.   Dental advisory given  Plan Discussed with: CRNA and Surgeon  Anesthesia Plan Comments:         Anesthesia Quick Evaluation

## 2012-01-28 NOTE — Discharge Instructions (Signed)

## 2012-01-28 NOTE — Procedures (Signed)
Successful placement of right sided 5 Fr catheter for PNL access.  Tip of catheter is within the urinary bladder.  No immediate complications.    

## 2012-01-28 NOTE — Progress Notes (Signed)
Night of surgery note  Subjective: The patient is doing well.  No complaints.  Objective: Vital signs in last 24 hours: Temp:  [96.9 F (36.1 C)-98 F (36.7 C)] 97.5 F (36.4 C) (02/25 1632) Pulse Rate:  [55-73] 62  (02/25 1632) Resp:  [5-21] 16  (02/25 1632) BP: (106-155)/(65-98) 144/80 mmHg (02/25 1632) SpO2:  [95 %-100 %] 97 % (02/25 1632) FiO2 (%):  [4 %] 4 % (02/25 1230) Weight:  [59.421 kg (131 lb)] 59.421 kg (131 lb) (02/25 1630)  Intake/Output this shift: Total I/O In: 2450 [I.V.:2450] Out: 495 [Urine:475; Blood:20]  Physical Exam:  General: Alert and oriented. Abdomen: Soft, Nondistended. Incisions: Clean with dry, intact dressing. Foley catheter is draining slightly blood-tinged urine without clots.  Lab Results: No results found for this basename: HGB:3,HCT:3 in the last 72 hours  Assessment/Plan: 1) Continue to monitor 2) Per orders   Marlea Gambill C. Vernie Ammons, MD  [place] 01/28/2012, 5:26 PM

## 2012-01-28 NOTE — H&P (Signed)
Amy Gilmore is an 76 y.o. female.   Chief Complaint: Right sided renal stones HPI: 76 y/o F with history of lower pole staghorn calculus, presents today for PCN placement prior to OR Nephrolithotomy.  The patient is currently without complaint.  No urinary symptoms, specifically no dysuria or hematuria.  No flank pain.  No fever or chills.  The patient is accompanied by her husband.  Past Medical History  Diagnosis Date  . Hypertension   . Hyperlipidemia   . Chest pain   . Palpitations   . Mitral valve prolapse   . Dizziness     occasional vertigo  . Right shoulder pain Dec 07, 2011 fall    pain at times with positioning  . Pain 01-22-12    pt c/o pain in back and under breast, ekg done 01-23-12    Past Surgical History  Procedure Date  . Rotator cuff repair 1997 and 2006    right  . Abdominal hysterectomy   . Bunionectomy 1995    and osteotomy  . Appendectomy 1955  . Refractive surgery yrs ago    Family History  Problem Relation Age of Onset  . Liver cancer Mother   . Heart attack Sister     ?  . Peripheral vascular disease Sister    Social History:  reports that she quit smoking about 36 years ago. She does not have any smokeless tobacco history on file. She reports that she drinks alcohol. She reports that she does not use illicit drugs.  Allergies:  Allergies  Allergen Reactions  . Codeine Nausea Only  . Sulfa Antibiotics Rash    Medications Prior to Admission  Medication Sig Dispense Refill  . acetaminophen (TYLENOL) 500 MG tablet Take 1,000 mg by mouth every 6 (six) hours as needed. Pain       . ampicillin (PRINCIPEN) 500 MG capsule Take 500 mg by mouth daily.      Marland Kitchen aspirin 81 MG EC tablet Take 81 mg by mouth daily before breakfast.       . atorvastatin (LIPITOR) 10 MG tablet Take 10 mg by mouth at bedtime.       Marland Kitchen diltiazem (CARDIZEM) 30 MG tablet Take 30-60 mg by mouth every 8 (eight) hours as needed. Take 1-2 tablets (30-60mg ) for increasing  palpitations.      . Glucosamine 500 MG CAPS Take 500 mg by mouth daily.       . Hypromellose (GENTEAL) 0.3 % SOLN Place 1-2 drops into both eyes 2 (two) times daily as needed. Dry eyes       . meclizine (ANTIVERT) 25 MG tablet Take 25 mg by mouth as needed. Inner ear       . Multiple Vitamins-Minerals (CENTRUM SILVER) tablet Take 1 tablet by mouth daily.       . Omega-3 Fatty Acids (FISH OIL) 1000 MG CAPS Take 1,000 mg by mouth daily.       . propranolol (INDERAL LA) 60 MG 24 hr capsule Take 60 mg by mouth daily before breakfast.       . valsartan-hydrochlorothiazide (DIOVAN-HCT) 160-25 MG per tablet Take 1 tablet by mouth daily before breakfast.       Medications Prior to Admission  Medication Dose Route Frequency Provider Last Rate Last Dose  . 0.9 %  sodium chloride infusion   Intravenous Once Robet Leu, PA      . ciprofloxacin (CIPRO) IVPB 400 mg  400 mg Intravenous Once Robet Leu, PA  Results for orders placed during the hospital encounter of 01/28/12 (from the past 48 hour(s))  GLUCOSE, CAPILLARY     Status: Abnormal   Collection Time   01/28/12  7:42 AM      Component Value Range Comment   Glucose-Capillary 121 (*) 70 - 99 (mg/dL)    No results found.  Review of Systems  Constitutional: Negative.   Respiratory: Negative.   Cardiovascular: Negative.   Genitourinary: Negative.     Blood pressure 123/85, pulse 64, temperature 97.6 F (36.4 C), temperature source Oral, resp. rate 18, SpO2 95.00%. Physical Exam  Constitutional: She appears well-developed and well-nourished.  Cardiovascular: Normal rate and regular rhythm.   Respiratory: Effort normal and breath sounds normal.  Psychiatric: She has a normal mood and affect.     Assessment/Plan 76 y/o F with right staghorn calculus who presents for PCN placement prior to OR nephrolithotomy. The patient is currently without complaint.  Informed written consent was obtained after a discussion of the benefits  and risks (including but not limited to bleeding, infection, injury to adjacent organs).   Simonne Come 01/28/2012, 8:01 AM

## 2012-01-28 NOTE — H&P (Signed)
History of Present Illness      Nephrolithiasis: She has been followed for right renal stone disease by Dr. Vonita Moss.  She was found by CT scan in 12/12 to have a partial staghorn calculus with Hounsfield units of 550 involving the lower pole of her right kidney. A KUB revealed interval increase in size of the stone and a urine culture was positive for Proteus suggest a struvite stone.   Interval history: She denies any flank pain currently. She's also not having any hematuria.   Past Medical History Problems  1. History of  Arthritis V13.4 2. History of  Hyperlipidemia 272.4 3. History of  Hypertension 401.9 4. History of  Murmurs 785.2 5. History of  Nephrolithiasis V13.01  Surgical History Problems  1. History of  Appendectomy 2. History of  Hysterectomy V45.77 3. History of  Rotator Cuff Repair 4. History of  Shoulder Surgery  Current Meds 1. Aspirin 81 MG Oral Tablet; Therapy: (Recorded:08Apr2010) to 2. Caltrate 600 TABS; Therapy: (Recorded:08Apr2010) to 3. Ciprofloxacin HCl 250 MG Oral Tablet; TAKE 1 TABLET BID; Therapy: 17Dec2012 to  (Evaluate:28Dec2012)  Requested for: 24Dec2012; Last Rx:21Dec2012 4. Diovan HCT 160-25 MG Oral Tablet; Therapy: 31Dec2010 to 5. Fish Oil CAPS; Therapy: (Recorded:08Apr2010) to 6. Glucosamine TABS; Therapy: (Recorded:08Apr2010) to 7. Inderal LA 60 MG Oral Capsule Extended Release 24 Hour; Therapy: (Recorded:08Apr2010) to 8. Lipitor 10 MG Oral Tablet; Therapy: (Recorded:08Apr2010) to  Allergies Medication  1. No Known Drug Allergies  Family History Problems  1. Family history of  Family Health Status Number Of Children 1 son; 2 daughters  Social History Problems  1. Being A Social Drinker 2. Caffeine Use 3-5 per day 3. Marital History - Currently Married 4. Never A Smoker 5. Occupation: Retired Denied  6. History of  Tobacco Use  Review of Systems Constitutional, skin, eye, otolaryngeal, hematologic/lymphatic, cardiovascular,  pulmonary, endocrine, musculoskeletal, gastrointestinal, neurological and psychiatric system(s) were reviewed and pertinent findings if present are noted.  Gastrointestinal: abdominal pain.  ENT: sinus problems.  Hematologic/Lymphatic: a tendency to easily bruise.  Musculoskeletal: joint pain.    Vitals Vital Signs Blood Pressure  Blood Pressure: 155 / 98 Temperature  Temperature: 97.8 F Pulse  Heart Rate: 62  Physical Exam Constitutional: Well nourished and well developed. No acute distress.  ENT:. The ears and nose are normal in appearance.  Neck: The appearance of the neck is normal and no neck mass is present.  Pulmonary: No respiratory distress and normal respiratory rhythm and effort.  Cardiovascular: Heart rate and rhythm are normal. No peripheral edema.  Abdomen: The abdomen is soft and nontender. No masses are palpated. No CVA tenderness. No hernias are palpable. No hepatosplenomegaly noted.  Genitourinary: Examination of the external genitalia shows normal female external genitalia and no lesions. The urethra is normal in appearance and not tender. There is no urethral mass. Vaginal exam demonstrates no abnormalities. The cervix is is absent. The uterus is absent. The bladder is non tender and not distended. The anus is normal on inspection. The perineum is normal on inspection.  Lymphatics: The femoral and inguinal nodes are not enlarged or tender.  Skin: Normal skin turgor, no visible rash and no visible skin lesions.  Neuro/Psych:. Mood and affect are appropriate.     Assessment Assessed  1. Staghorn Calculus Right 592.0 2. Urinary Tract Infection 599.0   She has a stone that involves the lower pole of her left kidney which is almost certainly a struvite stone based on its Hounsfield units and the  fact that she had a culture proven Proteus UTI. I therefore have discussed the treatment options with her however due to the stones size and location the most appropriate form  of treatment would be a percutaneous nephrolithotomy. I went over the procedure with her in detail and drew her pictures. We have discussed the risks and complications as well as the fact that her branched stone very likely may not be completely eradicated with a single procedure since there appear to be parallel calyces in the lower pole filled with stone. Because of that I have recommended proceeding with a percutaneous nephrolithotomy and possible need for a stent and probable need for a nephrostomy tube postoperatively with either a second look versus lithotripsy. She understands and has elected to proceed.   Plan     1. I will maintain her on antibiotics based on culture sensitivities prior to the procedure. (culture positive for Proteus sensitive to all antibiotics except nitrofurantoin.) 2. She will be scheduled for a  right percutaneous nephrostolithotomy.

## 2012-01-28 NOTE — Transfer of Care (Signed)
Immediate Anesthesia Transfer of Care Note  Patient: Amy Gilmore  Procedure(s) Performed: Procedure(s) (LRB): NEPHROLITHOTOMY PERCUTANEOUS (Right)  Patient Location: PACU  Anesthesia Type: General  Level of Consciousness: sedated, patient cooperative and responds to stimulation  Airway & Oxygen Therapy: Patient Spontanous Breathing and Patient connected to face mask oxygen  Post-op Assessment: Report given to PACU RN, Post -op Vital signs reviewed and stable and Patient moving all extremities  Post vital signs: Reviewed and stable  Complications: No apparent anesthesia complications

## 2012-01-28 NOTE — Op Note (Signed)
PATIENT:  Amy Gilmore  PRE-OPERATIVE DIAGNOSIS: Right partial staghorn calculus  POST-OPERATIVE DIAGNOSIS:  Same  PROCEDURE:  Procedure(s): 1. Right percutaneous nephrolithotomy 2. Right antegrade stent placement. 3. Right antegrade nephrostogram  SURGEON:  Garnett Farm  INDICATION: Amy Gilmore is a 76 year old female patient with a known history of calculus disease. She been followed for a right renal calculus by Dr. Vonita Moss and a CT scan done in 12/12 revealed a partial staghorn calculus with Hounsfield units of 550 involving the lower pole of the kidney. Urine culture was positive for Proteus and she was placed on antibiotics which he has remained on until the time of her surgery. She is brought to the operating room for a percutaneous nephrolithotomy.  ANESTHESIA:  General  EBL:  Minimal  DRAINS: 1. 6 French, 24 cm double-J stent in the right ureter. 2. 16 French Foley catheter in the bladder.  LOCAL MEDICATIONS USED:  None  SPECIMEN:  Stone sent for culture  Description of procedure: After informed consent the patient was taken to the major OR and on her stretcher was administered general anesthesia. She was then moved to the prone position and her right flank was sterilely prepped and draped. An official timeout was then performed.  The radiologist had previously placed a nephrostomy catheter down the right ureter and this was used to introduce a 0.038 inch floppy-tipped guidewire which was passed down the ureter under direct fluoroscopy and into the bladder. The catheter was removed, a transverse skin incision made over the guidewire and a second guidewire was then passed by first placing a coaxial catheter over the initial guidewire, passing this down the ureter under fluoroscopic control and removing the inner portion of the coaxial catheter. The second guidewire was then passed down the ureter under direct fluoroscopy and into the bladder. One guidewire was  used as a working guidewire and the second was secured to the drape as a safety guidewire.  Over the working guidewire the nephrostomy dilating balloon was then passed and positioned in the area the renal pelvis by fluoroscopy. It was then inflated to 12 atmospheres and the 30 French nephrostomy sheath was then passed over the balloon and into the area the renal pelvis. The balloon was then deflated and removed. The 8 French rigid nephroscope was then passed down the nephrostomy sheath and into the area the renal pelvis. I identified a stone grasp this with 2 prong graspers and extracted it. I then noted a second stone in an area that appeared to be a parallel calyx on the CT scan. I was able to grasp it but was unable to remove it so I used the Swiss lithoclast to fragment the stone using ultrasound and this allowed fragments of the stone to be extracted simultaneously. A remaining fragment in the calyx was grasped with 2 prong graspers and removed. I then inspected the renal pelvis and could not identify any further stone fragments. I therefore removed the rigid nephroscope and replaced that with the 17 French flexible cystoscope. I then inspected each calyx starting at the upper pole calyceal system which was confirmed by fluoroscopy and noted no further stone fragments present. I therefore injected contrast to perform a nephrostogram to determine if any further filling defects could be identified.  Antegrade nephrostogram was performed by the injecting full-strength contrast through the flexible cystoscope into the or the renal pelvis. There was some extravasation as to be expected from the site of the nephrostomy sheath but I could not identify  any stones within the renal pelvis or calyceal system. Prior to performing the nephrostogram fluoroscopy was performed and no stones could be identified on fluoroscopic images. I needed to be sure no stones had passed down the ureter and therefore passed the  nephrostomy catheter over the working guidewire down to the level of the ureteropelvic junction, remove the guidewire and then injected full-strength contrast as I removed the catheter. This revealed no filling defects within the ureter or other abnormalities. I advanced this all the way up to the UPJ and stop just short so I could pass the guidewire back down the ureter into the bladder. A second look with the rigid nephroscope revealed no stone fragments could be identified in any portion of the collecting system that was visible with the scope.  The rigid nephroscope was then passed over the guidewire and the stent passed over the guidewire and down the ureter into the bladder. As I removed the guidewire good curl was noted in the bladder and good curl was noted in the area the renal pelvis. I then elected to perform a "tubeless" procedure since there was minimal blood loss during the case and little bleeding at the end. I therefore measured the distance to the edge of the renal parenchyma and marked this on the laparoscopic FloSeal introducer. I then passed this down the nephrostomy sheath to the measured level placing it at the level of the renal parenchyma and began injecting the FloSeal as I removed the nephrostomy sheath and then slowly backed the introducer out as I injected the material along the nephrostomy tract. The safety guidewire was then removed and fluoroscopy confirmed the stent remained in good position. I therefore closed the skin with a running 4-0 subcuticular Monocryl suture and Dermabond was applied to the incision. The patient was awakened and taken to recovery room in stable and satisfactory condition. There were no intraoperative complications.  Her stone was sent for culture although she had been on antibiotics for several weeks. She will be maintained on antibiotics postoperatively.  PLAN OF CARE: She will be observed overnight with anticipation of discharge in the  morning.  PATIENT DISPOSITION:  PACU - hemodynamically stable.

## 2012-01-28 NOTE — ED Notes (Signed)
Patient denies pain and is resting comfortably.  

## 2012-01-29 DIAGNOSIS — B964 Proteus (mirabilis) (morganii) as the cause of diseases classified elsewhere: Secondary | ICD-10-CM | POA: Diagnosis not present

## 2012-01-29 DIAGNOSIS — E785 Hyperlipidemia, unspecified: Secondary | ICD-10-CM | POA: Diagnosis not present

## 2012-01-29 DIAGNOSIS — I10 Essential (primary) hypertension: Secondary | ICD-10-CM | POA: Diagnosis not present

## 2012-01-29 DIAGNOSIS — Z79899 Other long term (current) drug therapy: Secondary | ICD-10-CM | POA: Diagnosis not present

## 2012-01-29 DIAGNOSIS — N390 Urinary tract infection, site not specified: Secondary | ICD-10-CM | POA: Diagnosis not present

## 2012-01-29 DIAGNOSIS — N200 Calculus of kidney: Secondary | ICD-10-CM | POA: Diagnosis not present

## 2012-01-29 MED ORDER — HYDROCODONE-ACETAMINOPHEN 10-300 MG PO TABS: 1.0000 | ORAL_TABLET | Freq: Four times a day (QID) | ORAL | Status: DC | PRN

## 2012-01-29 NOTE — Discharge Summary (Signed)
Physician Discharge Summary  Patient ID: Amy Gilmore MRN: [id number] DOB/AGE: 1936-10-21 76 y.o.  Admit date: 01/28/2012 Discharge date: 01/29/2012  Admission Diagnoses:  Discharge Diagnoses:  Right partial staghorn calculus  Discharged Condition: good  Hospital Course: She underwent a successful right percutaneous nephrostolithotomy. It was performed without the need for a nephrostomy tube however a stent was left indwelling. She was maintained on Foley catheter drainage overnight. The following morning she was doing well with no significant pain and a dry, intact dressing. Her Foley catheter was removed and she was felt to be ready for discharge.  Discharge Exam: Blood pressure 105/65, pulse 70, temperature 97.4 F (36.3 C), temperature source Oral, resp. rate 18, height 5\' 4"  (1.626 m), weight 59.421 kg (131 lb), SpO2 97.00%.  Disposition: She is discharged home in stable and satisfactory condition. She will follow-up with me and remain on ampicillin. A CT scan will be obtained when she returns to be sure she is completely stone free and if any stone fragments remain we have discussed treatment with ureteroscopy in order to completely rid her of all stone since it is undoubtedly a struvite stone.  Discharge Orders    Future Orders Please Complete By Expires   Discontinue IV      Discharge patient        Medication List  As of 01/29/2012  6:57 AM   STOP taking these medications         aspirin 81 MG EC tablet         TAKE these medications         acetaminophen 500 MG tablet   Commonly known as: TYLENOL   Take 1,000 mg by mouth every 6 (six) hours as needed. Pain        ampicillin 500 MG capsule   Commonly known as: PRINCIPEN   Take 500 mg by mouth daily.      atorvastatin 10 MG tablet   Commonly known as: LIPITOR   Take 10 mg by mouth at bedtime.      CENTRUM SILVER tablet   Take 1 tablet by mouth daily.      diltiazem 30 MG tablet   Commonly known  as: CARDIZEM   Take 30-60 mg by mouth every 8 (eight) hours as needed. Take 1-2 tablets (30-60mg ) for increasing palpitations.      Fish Oil 1000 MG Caps   Take 1,000 mg by mouth daily.      GENTEAL 0.3 % Soln   Generic drug: Hypromellose   Place 1-2 drops into both eyes 2 (two) times daily as needed. Dry eyes        Glucosamine 500 MG Caps   Take 500 mg by mouth daily.      Hydrocodone-Acetaminophen 10-300 MG Tabs   Take 1-2 tablets by mouth every 6 (six) hours as needed.      meclizine 25 MG tablet   Commonly known as: ANTIVERT   Take 25 mg by mouth as needed. Inner ear        propranolol 60 MG 24 hr capsule   Commonly known as: INDERAL LA   Take 60 mg by mouth daily before breakfast.      valsartan-hydrochlorothiazide 160-25 MG per tablet   Commonly known as: DIOVAN-HCT   Take 1 tablet by mouth daily before breakfast.           Follow-up Information    Follow up with Garnett Farm, MD on 02/04/2012. (at 1:15)    Contact information:  [address] [place] [address] [phone]          Signed: Garnett Farm 01/29/2012, 6:57 AM

## 2012-01-29 NOTE — Progress Notes (Signed)
Patient d/c home, alert and oriented, husband at bedside. Incision site dry and intact pinkish on surrounding site.PIV removed no s/s of infection, no swelling noted. Discharge instructions and follow up appointment given to patient, verbalized understanding.

## 2012-01-30 LAB — WOUND CULTURE
Culture: NO GROWTH
Gram Stain: NONE SEEN

## 2012-01-31 ENCOUNTER — Encounter (HOSPITAL_COMMUNITY): Payer: Self-pay | Admitting: Urology

## 2012-02-04 ENCOUNTER — Other Ambulatory Visit: Payer: Self-pay | Admitting: Urology

## 2012-02-04 DIAGNOSIS — N390 Urinary tract infection, site not specified: Secondary | ICD-10-CM | POA: Diagnosis not present

## 2012-02-04 DIAGNOSIS — N200 Calculus of kidney: Secondary | ICD-10-CM | POA: Diagnosis not present

## 2012-02-18 ENCOUNTER — Encounter (HOSPITAL_BASED_OUTPATIENT_CLINIC_OR_DEPARTMENT_OTHER): Payer: Self-pay | Admitting: *Deleted

## 2012-02-18 NOTE — Progress Notes (Signed)
NPO AFTER MN. ARRIVES AT 0615. NEEDS ISTAT. CURRENT EKG IN CHART AN EPIC. WILL TAKE INDERAL AM OF SURG. W/ SIP OF WATER.

## 2012-02-21 NOTE — H&P (Signed)
History of Present Illness             Nephrolithiasis: She has been followed for right renal stone disease by Dr. Vonita Moss.  She was found by CT scan in 12/12 to have a partial staghorn calculus with Hounsfield units of 550 involving the lower pole of her right kidney. A KUB revealed interval increase in size of the stone and a urine culture was positive for Proteus suggest a struvite stone. She therefore underwent a PCNL on 01/28/12. I was able to clear all visible stone from the kidney.   Interval history: She returns today for followup imaging to be sure I have cleared all stone from her kidney. She has a stent indwelling and it is causing some irritative symptoms and some slight discomfort in her flank region. She's also noting some bladder spasms from the stent is well. She remains on amoxicillin and a low dose of one pill   Past Medical History Problems  1. History of  Arthritis V13.4 2. History of  Hyperlipidemia 272.4 3. History of  Hypertension 401.9 4. History of  Murmurs 785.2 5. History of  Nephrolithiasis V13.01  Surgical History Problems  1. History of  Appendectomy 2. History of  Hysterectomy V45.77 3. History of  Rotator Cuff Repair 4. History of  Shoulder Surgery  Current Meds 1. Ampicillin 500 MG Oral Capsule; TAKE 1 CAPSULE Bedtime; Therapy: 18Feb2013 to (Last  Rx:18Feb2013)  Requested for: 18Feb2013 2. Aspirin 81 MG Oral Tablet; Therapy: (Recorded:08Apr2010) to 3. Caltrate 600 TABS; Therapy: (Recorded:08Apr2010) to 4. Diovan HCT 160-25 MG Oral Tablet; Therapy: 31Dec2010 to 5. Fish Oil CAPS; Therapy: (Recorded:08Apr2010) to 6. Glucosamine TABS; Therapy: (Recorded:08Apr2010) to 7. Inderal LA 60 MG Oral Capsule Extended Release 24 Hour; Therapy: (Recorded:08Apr2010) to 8. Lipitor 10 MG Oral Tablet; Therapy: (Recorded:08Apr2010) to  Allergies Medication  1. Sulfamethoxazole-TMP DS TABS  Family History Problems  1. Family history of  Family Health Status Number  Of Children 1 son; 2 daughters  Social History Problems  1. Being A Social Drinker 2. Caffeine Use 3-5 per day 3. Marital History - Currently Married 4. Never A Smoker 5. Occupation: Retired Denied  6. History of  Tobacco Use  Review of Systems Constitutional, skin, eye, otolaryngeal, hematologic/lymphatic, cardiovascular, pulmonary, endocrine, musculoskeletal, gastrointestinal, neurological and psychiatric system(s) were reviewed and pertinent findings if present are noted.  Gastrointestinal: abdominal pain.  ENT: sinus problems.  Hematologic/Lymphatic: a tendency to easily bruise.  Musculoskeletal: joint pain.    Vitals Vital Signs  Blood Pressure  Blood Pressure: 155 / 98 Temperature  Temperature: 97.8 F Pulse  Heart Rate: 62  Physical Exam Constitutional: Well nourished and well developed. No acute distress.  ENT:. The ears and nose are normal in appearance.  Neck: The appearance of the neck is normal and no neck mass is present.  Pulmonary: No respiratory distress and normal respiratory rhythm and effort.  Cardiovascular: Heart rate and rhythm are normal. No peripheral edema.  Abdomen: The abdomen is soft and nontender. No masses are palpated. No CVA tenderness. No hernias are palpable. No hepatosplenomegaly noted.  Genitourinary: Examination of the external genitalia shows normal female external genitalia and no lesions. The urethra is normal in appearance and not tender. There is no urethral mass. Vaginal exam demonstrates no abnormalities. The cervix is is absent. The uterus is absent. The bladder is non tender and not distended. The anus is normal on inspection. The perineum is normal on inspection.  Lymphatics: The femoral and inguinal nodes are  not enlarged or tender.  Skin: Normal skin turgor, no visible rash and no visible skin lesions.  Neuro/Psych:. Mood and affect are appropriate.  The following images/tracing/specimen were independently visualized:  CT  scan:she has a 5 mm stone in the lower pole with 2 stones seen in the periphery of the lower pole that are 1-2 mm in size.    Assessment Assessed  1. Staghorn Calculus Right 592.0 2. Urinary Tract Infection 599.0      She does have some stone fragments that have remained and therefore, as we had discussed, my recommendation is to proceed with ureteroscopic extraction of these remaining stone since they are undoubtedly struvite and would likely result in re-formation of a staghorn calculus and recurrent UTIs. I therefore have discussed with her the need for ureteroscopy and removal of all remaining stone from her kidney. I gone over this procedure with her in detail including its risks and competitions. She understands and has elected to proceed. She will remain on amoxicillin.   Plan      1. Continue amoxicillin one pill each day. 2. I refilled her hydrocodone. 3. I gave her samples of VESIcare. 4. She will be scheduled for right ureteroscopy and removal of her remaining right renal calculi.

## 2012-02-22 ENCOUNTER — Encounter (HOSPITAL_BASED_OUTPATIENT_CLINIC_OR_DEPARTMENT_OTHER): Payer: Self-pay | Admitting: Anesthesiology

## 2012-02-22 ENCOUNTER — Ambulatory Visit (HOSPITAL_BASED_OUTPATIENT_CLINIC_OR_DEPARTMENT_OTHER): Payer: Medicare Other | Admitting: Anesthesiology

## 2012-02-22 ENCOUNTER — Ambulatory Visit (HOSPITAL_BASED_OUTPATIENT_CLINIC_OR_DEPARTMENT_OTHER)
Admission: RE | Admit: 2012-02-22 | Discharge: 2012-02-22 | Disposition: A | Discharge status: Home / Self Care | Payer: Medicare Other | Source: Ambulatory Visit | Attending: Urology | Admitting: Urology

## 2012-02-22 ENCOUNTER — Encounter (HOSPITAL_BASED_OUTPATIENT_CLINIC_OR_DEPARTMENT_OTHER): Payer: Self-pay | Admitting: *Deleted

## 2012-02-22 ENCOUNTER — Encounter (HOSPITAL_BASED_OUTPATIENT_CLINIC_OR_DEPARTMENT_OTHER)
Admission: RE | Disposition: A | Discharge status: Home / Self Care | Payer: Self-pay | Source: Ambulatory Visit | Attending: Urology

## 2012-02-22 DIAGNOSIS — I10 Essential (primary) hypertension: Secondary | ICD-10-CM | POA: Insufficient documentation

## 2012-02-22 DIAGNOSIS — E785 Hyperlipidemia, unspecified: Secondary | ICD-10-CM | POA: Insufficient documentation

## 2012-02-22 DIAGNOSIS — Z79899 Other long term (current) drug therapy: Secondary | ICD-10-CM | POA: Insufficient documentation

## 2012-02-22 DIAGNOSIS — Z7982 Long term (current) use of aspirin: Secondary | ICD-10-CM | POA: Diagnosis not present

## 2012-02-22 DIAGNOSIS — N200 Calculus of kidney: Secondary | ICD-10-CM | POA: Diagnosis not present

## 2012-02-22 HISTORY — DX: Personal history of urinary calculi: Z87.442

## 2012-02-22 HISTORY — DX: Calculus of kidney: N20.0

## 2012-02-22 HISTORY — PX: CYSTOSCOPY: SHX5120

## 2012-02-22 HISTORY — PX: URETEROSCOPY: SHX842

## 2012-02-22 HISTORY — DX: Urgency of urination: R39.15

## 2012-02-22 HISTORY — DX: Dizziness and giddiness: R42

## 2012-02-22 HISTORY — DX: Frequency of micturition: R35.0

## 2012-02-22 HISTORY — DX: Atrial premature depolarization: I49.1

## 2012-02-22 LAB — POCT I-STAT, CHEM 8
BUN: 15 mg/dL (ref 6–23)
Calcium, Ion: 1.27 mmol/L (ref 1.12–1.32)
Chloride: 106 mEq/L (ref 96–112)
Creatinine, Ser: 0.7 mg/dL (ref 0.50–1.10)
Glucose, Bld: 113 mg/dL — ABNORMAL HIGH (ref 70–99)
HCT: 33 % — ABNORMAL LOW (ref 36.0–46.0)
Hemoglobin: 11.2 g/dL — ABNORMAL LOW (ref 12.0–15.0)
Potassium: 3.2 mEq/L — ABNORMAL LOW (ref 3.5–5.1)
Sodium: 143 mEq/L (ref 135–145)
TCO2: 28 mmol/L (ref 0–100)

## 2012-02-22 SURGERY — URETEROSCOPY
Anesthesia: General | Site: Ureter | Laterality: Right | Wound class: Clean Contaminated

## 2012-02-22 MED ORDER — OXYBUTYNIN CHLORIDE 5 MG PO TABS: 5.0000 mg | ORAL_TABLET | Freq: Once | ORAL | Status: DC

## 2012-02-22 MED ORDER — PHENAZOPYRIDINE HCL 200 MG PO TABS: 200.0000 mg | ORAL_TABLET | Freq: Three times a day (TID) | ORAL | Status: AC | PRN

## 2012-02-22 MED ORDER — ONDANSETRON HCL 4 MG/2ML IJ SOLN
INTRAMUSCULAR | Status: DC | PRN
  Administered 2012-02-22: 4 mg via INTRAVENOUS

## 2012-02-22 MED ORDER — FENTANYL CITRATE 0.05 MG/ML IJ SOLN: 25.0000 ug | INTRAMUSCULAR | Status: DC | PRN

## 2012-02-22 MED ORDER — LACTATED RINGERS IV SOLN
INTRAVENOUS | Status: DC
  Administered 2012-02-22 (×2): via INTRAVENOUS
  Administered 2012-02-22: 100 mL/h via INTRAVENOUS

## 2012-02-22 MED ORDER — DEXAMETHASONE SODIUM PHOSPHATE 4 MG/ML IJ SOLN
INTRAMUSCULAR | Status: DC | PRN
  Administered 2012-02-22: 8 mg via INTRAVENOUS

## 2012-02-22 MED ORDER — LIDOCAINE HCL (CARDIAC) 20 MG/ML IV SOLN
INTRAVENOUS | Status: DC | PRN
  Administered 2012-02-22: 40 mg via INTRAVENOUS

## 2012-02-22 MED ORDER — GENTAMICIN IN SALINE 1.6-0.9 MG/ML-% IV SOLN
80.0000 mg | INTRAVENOUS | Status: AC
  Administered 2012-02-22: 80 mg via INTRAVENOUS

## 2012-02-22 MED ORDER — PROPOFOL 10 MG/ML IV EMUL
INTRAVENOUS | Status: DC | PRN
  Administered 2012-02-22: 150 mg via INTRAVENOUS

## 2012-02-22 MED ORDER — TRAMADOL HCL 50 MG PO TABS: 50.0000 mg | ORAL_TABLET | Freq: Four times a day (QID) | ORAL | Status: AC | PRN

## 2012-02-22 MED ORDER — FENTANYL CITRATE 0.05 MG/ML IJ SOLN
INTRAMUSCULAR | Status: DC | PRN
  Administered 2012-02-22: 25 ug via INTRAVENOUS
  Administered 2012-02-22: 50 ug via INTRAVENOUS
  Administered 2012-02-22 (×2): 25 ug via INTRAVENOUS

## 2012-02-22 MED ORDER — PHENAZOPYRIDINE HCL 200 MG PO TABS: 200.0000 mg | ORAL_TABLET | Freq: Once | ORAL | Status: DC

## 2012-02-22 MED ORDER — SODIUM CHLORIDE 0.9 % IR SOLN
Status: DC | PRN
  Administered 2012-02-22: 3000 mL

## 2012-02-22 MED ORDER — BELLADONNA ALKALOIDS-OPIUM 16.2-60 MG RE SUPP
RECTAL | Status: DC | PRN
  Administered 2012-02-22: 1 via RECTAL

## 2012-02-22 MED ORDER — CEFAZOLIN SODIUM 1-5 GM-% IV SOLN
1.0000 g | INTRAVENOUS | Status: AC
  Administered 2012-02-22: 1 g via INTRAVENOUS

## 2012-02-22 SURGICAL SUPPLY — 43 items
ADAPTER CATH URET PLST 4-6FR (CATHETERS) IMPLANT
ADPR CATH URET STRL DISP 4-6FR (CATHETERS)
BAG DRAIN URO-CYSTO SKYTR STRL (DRAIN) ×2 IMPLANT
BAG DRN UROCATH (DRAIN) ×1
BASKET LASER NITINOL 1.9FR (BASKET) ×1 IMPLANT
BASKET STNLS GEMINI 4WIRE 3FR (BASKET) IMPLANT
BASKET ZERO TIP NITINOL 2.4FR (BASKET) IMPLANT
BRUSH URET BIOPSY 3F (UROLOGICAL SUPPLIES) IMPLANT
BSKT STON RTRVL 120 1.9FR (BASKET) ×1
BSKT STON RTRVL GEM 120X11 3FR (BASKET)
BSKT STON RTRVL ZERO TP 2.4FR (BASKET)
CANISTER SUCT LVC 12 LTR MEDI- (MISCELLANEOUS) ×1 IMPLANT
CATH INTERMIT  6FR 70CM (CATHETERS) IMPLANT
CATH URET 5FR 28IN CONE TIP (BALLOONS)
CATH URET 5FR 70CM CONE TIP (BALLOONS) IMPLANT
CLOTH BEACON ORANGE TIMEOUT ST (SAFETY) ×2 IMPLANT
DRAPE CAMERA CLOSED 9X96 (DRAPES) ×2 IMPLANT
ELECT REM PT RETURN 9FT ADLT (ELECTROSURGICAL)
ELECTRODE REM PT RTRN 9FT ADLT (ELECTROSURGICAL) IMPLANT
GLOVE BIO SURGEON STRL SZ 6.5 (GLOVE) ×1 IMPLANT
GLOVE BIO SURGEON STRL SZ8 (GLOVE) ×2 IMPLANT
GLOVE ECLIPSE 6.0 STRL STRAW (GLOVE) ×1 IMPLANT
GLOVE INDICATOR 7.5 STRL GRN (GLOVE) ×2 IMPLANT
GLOVE SKINSENSE NS SZ7.0 (GLOVE) ×1
GLOVE SKINSENSE STRL SZ7.0 (GLOVE) IMPLANT
GOWN PREVENTION PLUS LG XLONG (DISPOSABLE) ×2 IMPLANT
GOWN STRL REIN XL XLG (GOWN DISPOSABLE) ×4 IMPLANT
GUIDEWIRE 0.038 PTFE COATED (WIRE) IMPLANT
GUIDEWIRE ANG ZIPWIRE 038X150 (WIRE) IMPLANT
GUIDEWIRE STR DUAL SENSOR (WIRE) ×2 IMPLANT
IV NS IRRIG 3000ML ARTHROMATIC (IV SOLUTION) ×3 IMPLANT
KIT BALLIN UROMAX 15FX10 (LABEL) IMPLANT
KIT BALLN UROMAX 15FX4 (MISCELLANEOUS) IMPLANT
KIT BALLN UROMAX 26 75X4 (MISCELLANEOUS)
LASER FIBER DISP (UROLOGICAL SUPPLIES) ×2 IMPLANT
NS IRRIG 500ML POUR BTL (IV SOLUTION) ×1 IMPLANT
PACK CYSTOSCOPY (CUSTOM PROCEDURE TRAY) ×2 IMPLANT
SET HIGH PRES BAL DIL (LABEL)
SHEATH ACCESS URETERAL 38CM (SHEATH) ×1 IMPLANT
SHEATH URET ACCESS 12FR/35CM (UROLOGICAL SUPPLIES) IMPLANT
SHEATH URET ACCESS 12FR/55CM (UROLOGICAL SUPPLIES) IMPLANT
STENT URET 6FRX24 CONTOUR (STENTS) ×1 IMPLANT
WATER STERILE IRR 3000ML UROMA (IV SOLUTION) IMPLANT

## 2012-02-22 NOTE — Anesthesia Preprocedure Evaluation (Signed)
Anesthesia Evaluation  Patient identified by MRN, date of birth, ID band Patient awake    Reviewed: Allergy & Precautions, H&P , NPO status , Patient's Chart, lab work & pertinent test results, reviewed documented beta blocker date and time   Airway Mallampati: II TM Distance: >3 FB Neck ROM: Full    Dental  (+) Teeth Intact   Pulmonary neg pulmonary ROS,  breath sounds clear to auscultation        Cardiovascular hypertension, Pt. on medications Rhythm:Regular  Hx palpitations, cardiologist hasn't felt further w/u indicated   Neuro/Psych negative neurological ROS  negative psych ROS   GI/Hepatic negative GI ROS, Neg liver ROS,   Endo/Other  negative endocrine ROS  Renal/GU Kidney stone  negative genitourinary   Musculoskeletal negative musculoskeletal ROS (+)   Abdominal   Peds negative pediatric ROS (+)  Hematology negative hematology ROS (+)   Anesthesia Other Findings   Reproductive/Obstetrics negative OB ROS                           Anesthesia Physical Anesthesia Plan  ASA: II  Anesthesia Plan: General   Post-op Pain Management:    Induction: Intravenous  Airway Management Planned: LMA  Additional Equipment:   Intra-op Plan:   Post-operative Plan: Extubation in OR  Informed Consent: I have reviewed the patients History and Physical, chart, labs and discussed the procedure including the risks, benefits and alternatives for the proposed anesthesia with the patient or authorized representative who has indicated his/her understanding and acceptance.   Dental advisory given  Plan Discussed with: CRNA and Surgeon  Anesthesia Plan Comments:         Anesthesia Quick Evaluation

## 2012-02-22 NOTE — Op Note (Signed)
PATIENT:  Amy Gilmore  PRE-OPERATIVE DIAGNOSIS:  1. Right renal calculi 2. Right ureteral stent  POST-OPERATIVE DIAGNOSIS: Same  PROCEDURE:  1. Removal of right ureteral stent. 2. Right ureteroscopy with stone extraction. 3. Right double-J stent placement  SURGEON: Garnett Farm, MD  INDICATION: Mrs. Murdock is a 76 year old female who had a large right staghorn calculus. She underwent a right percutaneous nephrostolithotomy and on followup was found to have remaining fragments in the kidney. We discussed the treatment options and she has elected to proceed with ureteroscopic extraction of the remaining fragments.  ANESTHESIA:  General  EBL:  Minimal  DRAINS: 6 French, 24 cm double-J stent in the right ureter (with string)  SPECIMEN:  Stone given the patient  DESCRIPTION OF PROCEDURE: The patient was taken to the major OR and placed on the table. General anesthesia was administered and then the patient was moved to the dorsal lithotomy position. The genitalia was sterilely prepped and draped. An official timeout was performed.  Initially the 22 French cystoscope with 12 lens was passed under direct vision. The bladder was then entered and fully inspected. It was noted be free of any tumors stones or inflammatory lesions. Ureteral orifices were of normal configuration and position with the right ureteral orifice containing a double-J stent. The alligator graspers were then used to grasp the stent and it was withdrawn through the urethral meatus. I then passed a 0.038 inch floppy-tipped guidewire through the ureteral stent and into the area the renal pelvis under fluoroscopic control. The stent was removed and the guidewire was left in place.  I then passed the 6 [place] digital ureteroscope over the guidewire into the or the renal pelvis and remove the guidewire. I began inspecting each calyx starting at the upper pole and noted no lesions or stones in the upper pole or  middle pole calyces. I then identified 3 stones in a lower pole calyx. I passed the Nitinol basket through the ureteroscope and was able to grasp the larger stone but felt that its shape did not lend itself well to an attempt at extraction through the ureter and therefore I placed it in an upper pole calyx and disengaged the basket. I passed the guidewire back through the ureteroscope and then removed the ureteroscope. The ureteral access sheath was then passed over the guidewire easily up the ureter into the area of the UPJ and the inner cannula as well as guidewire were removed. I passed the flexible ureteroscope through the access sheath and back into the lower pole calyx where I was able to grasp the 2 smaller stones with the Nitinol basket and extracted these without difficulty. I felt the larger stone would likely require laser lithotripsy however with the access sheath in place I passed the basket through the ureteroscope and reengaged the stone this time orienting the long axis of the stone along the axis of the ureter and withdrew the stone and ureteroscope into the access sheath without difficulty. I then removed the access sheath, ureteroscope and stone all in one piece.  I reinserted the ureteroscope under direct vision into the bladder, into the right ureteral orifice and passed it under direct vision up the right ureter and noted no injury throughout the length of the ureter. A guidewire was then passed through the ureteroscope into the renal pelvis under fluoroscopy and left in that location and the ureteroscope was removed. I backloaded the cystoscope over the guidewire, past the new stent over the guidewire into the  area the renal pelvis and as I removed the guidewire there was good curl noted in the renal pelvis and in the bladder. The bladder was then drained, the cystoscope removed and the string, that was affixed to the distal aspect of the stent, was secured to the patient with tape.   The  patient tolerated the procedure well with  no intraoperative complications.  PLAN OF CARE: Discharge to home after PACU  PATIENT DISPOSITION:  PACU - hemodynamically stable.

## 2012-02-22 NOTE — Interval H&P Note (Signed)
History and Physical Interval Note:  02/22/2012 7:36 AM  Amy Gilmore  has presented today for surgery, with the diagnosis of right renal calculus  The various methods of treatment have been discussed with the patient and family. After consideration of risks, benefits and other options for treatment, the patient has consented to  Procedure(s) (LRB): URETEROSCOPY (Right) as a surgical intervention .  The patients' history has been reviewed, patient examined, no change in status, stable for surgery.  I have reviewed the patients' chart and labs.  Questions were answered to the patient's satisfaction.     Garnett Farm

## 2012-02-22 NOTE — Anesthesia Postprocedure Evaluation (Signed)
  Anesthesia Post-op Note  Patient: Venida Tsukamoto [facility]  Procedure(s) Performed: Procedure(s) (LRB): URETEROSCOPY (Right) CYSTOSCOPY (Right) URETERAL CATHETER OR STENT PLACEMENT (Right)  Patient Location: PACU  Anesthesia Type: General  Level of Consciousness: oriented and sedated  Airway and Oxygen Therapy: Patient Spontanous Breathing  Post-op Pain: mild  Post-op Assessment: Post-op Vital signs reviewed, Patient's Cardiovascular Status Stable, Respiratory Function Stable and Patent Airway  Post-op Vital Signs: stable  Complications: No apparent anesthesia complications

## 2012-02-22 NOTE — Anesthesia Procedure Notes (Signed)
Procedure Name: LMA Insertion Date/Time: 02/22/2012 7:47 AM Performed by: Norva Pavlov Pre-anesthesia Checklist: Patient identified, Emergency Drugs available, Suction available and Patient being monitored Patient Re-evaluated:Patient Re-evaluated prior to inductionOxygen Delivery Method: Circle System Utilized Preoxygenation: Pre-oxygenation with 100% oxygen Intubation Type: IV induction Ventilation: Mask ventilation without difficulty LMA: LMA inserted LMA Size: 4.0 Number of attempts: 1 Airway Equipment and Method: bite block Placement Confirmation: positive ETCO2 Tube secured with: Tape Dental Injury: Teeth and Oropharynx as per pre-operative assessment

## 2012-02-22 NOTE — Transfer of Care (Signed)
Immediate Anesthesia Transfer of Care Note  Patient: Amy Gilmore [facility]  Procedure(s) Performed: Procedure(s) (LRB): URETEROSCOPY (Right) CYSTOSCOPY (Right) URETERAL CATHETER OR STENT PLACEMENT (Right)  Patient Location: PACU  Anesthesia Type: General  Level of Consciousness: awake, alert  and oriented  Airway & Oxygen Therapy: Patient Spontanous Breathing and Patient connected to face mask oxygen  Post-op Assessment: Report given to PACU RN and Post -op Vital signs reviewed and stable  Post vital signs: Reviewed and stable  Complications: No apparent anesthesia complications

## 2012-02-22 NOTE — Discharge Instructions (Addendum)
Post stone removal/stent placement surgery instructions   Definitions:  Ureter: The duct that transports urine from the kidney to the bladder. Stent: A plastic hollow tube that is placed into the ureter, from the kidney to the bladder to prevent the ureter from swelling shut.  General instructions:  Despite the fact that no skin incisions were used, the area around the ureter and bladder is raw and irritated. The stent is a foreign body which will further irritate the bladder wall. This irritation is manifested by increased frequency of urination, both day and night, and by an increase in the urge to urinate. In some, the urge to urinate is present almost always. Sometimes the urge is strong enough that you may not be able to stop your self from urinating. The only real cure is to remove the stent and then give time for the bladder wall to heal which can't be done until the danger of the ureter swelling shut has passed. (This varies from 2-21 days).  You may see some blood in your urine while the stent is in place and a few days afterward. Do not be alarmed, even if the urine is clear for a while. Get off your feet and drink lots of fluids until clearing occurs. If you start to pass clots or don't improve, call us.  If you have a string coming from your urethra:  The stent string is attached to your ureteral stent.  Do not pull on thisIf you have a string coming from your urethra:  The stent string is attached to your ureteral stent.  Do not pull on this.  Diet:  You may return to your normal diet immediately. Because of the raw surface of your bladder, alcohol, spicy foods, foods high in acid and drinks with caffeine may cause irritation or frequency and should be used in moderation. To keep your urine flowing freely and avoid constipation, drink plenty of fluids during the day (8-10 glasses). Tip: Avoid cranberry juice because it is very acidic.  Activity:  Your physical activity doesn't need  to be restricted. However, if you are very active, you may see some blood in the urine. We suggest that you reduce your activity under the circumstances until the bleeding has stopped.  Bowels:  It is important to keep your bowels regular during the postoperative period. Straining with bowel movements can cause bleeding. A bowel movement every other day is reasonable. Use a mild laxative if needed, such as milk of magnesia 2-3 tablespoons, or 2 Dulcolax tablets. Call if you continue to have problems. If you had been taking narcotics for pain, before, during or after your surgery, you may be constipated. Take a laxative if necessary.     Medication:  You should resume your pre-surgery medications unless told not to. DO NOT RESUME YOUR ASPIRIN, or any other medicines like ibuprofen, motrin, excedrin, advil, aleve, vitamin E, fish oil as these can all cause bleeding x 7 days. In addition you may be given an antibiotic to prevent or treat infection. Antibiotics are not always necessary. All medication should be taken as prescribed until the bottles are finished unless you are having an unusual reaction to one of the drugs.  Problems you should report to [place]:  a. Fever greater than 101F. b. Heavy bleeding, or clots (see notes above about blood in urine). c. Inability to urinate. d. Drug reactions (hives, rash, nausea, vomiting, diarrhea). e. Severe burning or pain with urination that is not improving.  Followup:  You will need a followup appointment to monitor your progress in most cases. Please call the office for this appointment when you get home if your appointment has not already been scheduled. Usually the first appointment will be about 5-14 days after your surgery and if you have a stent in place it will likely be removed at that time. Kidney Stones Kidney stones (ureteral lithiasis) are solid masses that form inside your kidneys. The intense pain is caused by the stone moving through the  kidney, ureter, bladder, and urethra (urinary tract). When the stone moves, the ureter starts to spasm around the stone. The stone is usually passed in the urine.  HOME CARE  Drink enough fluids to keep your pee (urine) clear or pale yellow. This helps to get the stone out.   Strain all pee through the provided strainer. Do not pee without peeing through the strainer, not even once. If you pee the stone out, catch it. The stone may be as small as a grain of salt. Take this to your doctor.   Only take medicine as told by your doctor.   Follow up with your doctor as told.   Get follow-up X-rays as told by your doctor.  GET HELP RIGHT AWAY IF:   Your pain does not get better with medicine.   You have a fever.   Your pain increases and gets worse over 18 hours.   You have new belly (abdominal) pain.   You feel faint or pass out.  MAKE SURE YOU:   Understand these instructions.   Will watch your condition.   Will get help right away if you are not doing well or get worse.  Document Released: 05/07/2008 Document Revised: 11/08/2011 Document Reviewed: 09/16/2009 [facility] Patient Information 2012 [place], [place].

## 2012-02-25 ENCOUNTER — Encounter (HOSPITAL_BASED_OUTPATIENT_CLINIC_OR_DEPARTMENT_OTHER): Payer: Self-pay | Admitting: Urology

## 2012-02-28 DIAGNOSIS — N200 Calculus of kidney: Secondary | ICD-10-CM | POA: Diagnosis not present

## 2012-03-13 DIAGNOSIS — R5381 Other malaise: Secondary | ICD-10-CM | POA: Diagnosis not present

## 2012-03-13 DIAGNOSIS — M542 Cervicalgia: Secondary | ICD-10-CM | POA: Diagnosis not present

## 2012-03-13 DIAGNOSIS — R5383 Other fatigue: Secondary | ICD-10-CM | POA: Diagnosis not present

## 2012-03-13 DIAGNOSIS — R42 Dizziness and giddiness: Secondary | ICD-10-CM | POA: Diagnosis not present

## 2012-04-08 DIAGNOSIS — Z0000 Encounter for general adult medical examination without abnormal findings: Secondary | ICD-10-CM | POA: Diagnosis not present

## 2012-04-08 DIAGNOSIS — Z124 Encounter for screening for malignant neoplasm of cervix: Secondary | ICD-10-CM | POA: Diagnosis not present

## 2012-04-08 DIAGNOSIS — N760 Acute vaginitis: Secondary | ICD-10-CM | POA: Diagnosis not present

## 2012-04-08 DIAGNOSIS — R300 Dysuria: Secondary | ICD-10-CM | POA: Diagnosis not present

## 2012-05-20 DIAGNOSIS — H25019 Cortical age-related cataract, unspecified eye: Secondary | ICD-10-CM | POA: Diagnosis not present

## 2012-05-29 DIAGNOSIS — H04209 Unspecified epiphora, unspecified lacrimal gland: Secondary | ICD-10-CM | POA: Diagnosis not present

## 2012-06-11 ENCOUNTER — Ambulatory Visit: Payer: Self-pay | Admitting: Internal Medicine

## 2012-06-11 DIAGNOSIS — Z1231 Encounter for screening mammogram for malignant neoplasm of breast: Secondary | ICD-10-CM | POA: Diagnosis not present

## 2012-06-18 DIAGNOSIS — H169 Unspecified keratitis: Secondary | ICD-10-CM | POA: Diagnosis not present

## 2012-07-02 ENCOUNTER — Ambulatory Visit (INDEPENDENT_AMBULATORY_CARE_PROVIDER_SITE_OTHER): Payer: Medicare Other | Admitting: Gastroenterology

## 2012-07-02 ENCOUNTER — Encounter: Payer: Self-pay | Admitting: Gastroenterology

## 2012-07-02 VITALS — BP 134/80 | HR 60 | Ht 64.0 in | Wt 130.0 lb

## 2012-07-02 DIAGNOSIS — K648 Other hemorrhoids: Secondary | ICD-10-CM | POA: Diagnosis not present

## 2012-07-02 DIAGNOSIS — K625 Hemorrhage of anus and rectum: Secondary | ICD-10-CM | POA: Diagnosis not present

## 2012-07-02 MED ORDER — HYDROCORTISONE ACETATE 25 MG RE SUPP: 25.0000 mg | Freq: Two times a day (BID) | RECTAL | Status: AC

## 2012-07-02 NOTE — Assessment & Plan Note (Signed)
Limited rectal bleeding is most likely hemorrhoidal.  A more proximal colonic bleeding source should be ruled out.  Recommendations #1 colonoscopy 

## 2012-07-02 NOTE — Patient Instructions (Addendum)
Your Colonoscopy is scheduled Separate instructions have been given We have given you a MoviPrep today

## 2012-07-02 NOTE — Progress Notes (Signed)
History of Present Illness: Pleasant 76 year old white female referred at the request of Dr. Welton Flakes for evaluation of rectal bleeding. She's had hemorrhoids for years characterized by discomfort. Over the past several months she's been experiencing intermittent rectal bleeding consisting of blood on the toilet tissue and occasionally staining her undergarments. She denies pain. This is incidental to several procedures for kidney stones. She denies change of bowel habits. Last colonoscopy in 2004 showed diverticulosis.     Past Medical History  Diagnosis Date  . Hypertension   . Hyperlipidemia   . Palpitations hx pac's-   occasional  . Mitral valve prolapse per dr wall note 09/12--  stable  . Right shoulder pain Dec 07, 2011 fall    pain at times with positioning  . History of kidney stones   . PAC (premature atrial contraction) followed by cardiologist --  dr wall--  last visit  sept 12  note in epic--  states stable  . Vertigo, intermittent   . Renal calculus, right followed by dr Vernie Ammons    February 2013  . Frequency of urination   . Urgency of micturation   . Diverticulosis    Past Surgical History  Procedure Date  . Rotator cuff repair 1997 and 2006    right  . Bunionectomy 1995    and osteotomy  . Appendectomy 1955  . Refractive surgery yrs ago  . Nephrolithotomy 01/28/2012-  right ureteral stent placement    Procedure: NEPHROLITHOTOMY PERCUTANEOUS;  Surgeon: Garnett Farm, MD;  Location: [facility];  Service: Urology;  Laterality: Right;  . Vaginal hysterectomy 1985  . Ureteroscopy 02/22/2012    Procedure: URETEROSCOPY;  Surgeon: Garnett Farm, MD;  Location: [facility];  Service: Urology;  Laterality: Right;  URETEROSCOPY WITH STONE EXTRACTION   . Cystoscopy 02/22/2012    Procedure: CYSTOSCOPY;  Surgeon: Garnett Farm, MD;  Location: [facility];  Service: Urology;  Laterality: Right;  removal of ureteral stent  . Other surgical history 01/28/12   right percutaneous nephroliathothoma   family history includes Heart attack in her sister; Liver cancer in her mother; and Peripheral vascular disease in her sister.  There is no history of Colon cancer. Current Outpatient Prescriptions  Medication Sig Dispense Refill  . acetaminophen (TYLENOL) 500 MG tablet Take 1,000 mg by mouth every 6 (six) hours as needed. Pain       . aspirin 81 MG tablet Take 81 mg by mouth daily.      Marland Kitchen atorvastatin (LIPITOR) 10 MG tablet Take 10 mg by mouth at bedtime.       Marland Kitchen diltiazem (CARDIZEM) 30 MG tablet Take 30-60 mg by mouth as needed. Take 1-2 tablets (30-60mg ) for increasing palpitations.      . Glucosamine 500 MG CAPS Take 500 mg by mouth daily.       . Hypromellose (GENTEAL) 0.3 % SOLN Place 1-2 drops into both eyes 2 (two) times daily as needed. Dry eyes       . meclizine (ANTIVERT) 25 MG tablet Take 25 mg by mouth as needed. Inner ear      . Multiple Vitamins-Minerals (CENTRUM SILVER) tablet Take 1 tablet by mouth daily.       . propranolol (INDERAL LA) 60 MG 24 hr capsule Take 60 mg by mouth daily before breakfast.       . valsartan-hydrochlorothiazide (DIOVAN-HCT) 160-25 MG per tablet Take 1 tablet by mouth daily before breakfast.       Allergies as of  07/02/2012 - Review Complete 07/02/2012  Allergen Reaction Noted  . Codeine Nausea Only   . Sulfa antibiotics Rash 01/21/2012    reports that she quit smoking about 36 years ago. She has never used smokeless tobacco. She reports that she drinks alcohol. She reports that she does not use illicit drugs.     Review of Systems: Pertinent positive and negative review of systems were noted in the above HPI section. All other review of systems were otherwise negative.  Vital signs were reviewed in today's medical record Physical Exam: General: Well developed , well nourished, no acute distress Head: Normocephalic and atraumatic Eyes:  sclerae anicteric, EOMI Ears: Normal auditory acuity Mouth: No  deformity or lesions Neck: Supple, no masses or thyromegaly Lungs: Clear throughout to auscultation Heart: Regular rate and rhythm; no murmurs, rubs or bruits Abdomen: Soft, non tender and non distended. No masses, hepatosplenomegaly or hernias noted. Normal Bowel sounds Rectal: grade 3 internal hemorrhoids are present. There are no rectal masses. Stools Hemoccult negative  Musculoskeletal: Symmetrical with no gross deformities  Skin: No lesions on visible extremities Pulses:  Normal pulses noted Extremities: No clubbing, cyanosis, edema or deformities noted Neurological: Alert oriented x 4, grossly nonfocal Cervical Nodes:  No significant cervical adenopathy Inguinal Nodes: No significant inguinal adenopathy Psychological:  Alert and cooperative. Normal mood and affect

## 2012-07-02 NOTE — Assessment & Plan Note (Signed)
Plan trial of Anusol HC suppositories. Should this be unsuccessful I would consider band ligation.

## 2012-07-03 ENCOUNTER — Ambulatory Visit (INDEPENDENT_AMBULATORY_CARE_PROVIDER_SITE_OTHER): Payer: Self-pay | Admitting: General Surgery

## 2012-07-03 ENCOUNTER — Encounter: Payer: Self-pay | Admitting: Gastroenterology

## 2012-07-03 ENCOUNTER — Ambulatory Visit (AMBULATORY_SURGERY_CENTER): Payer: Medicare Other | Admitting: Gastroenterology

## 2012-07-03 VITALS — BP 147/72 | HR 52 | Temp 97.9°F | Resp 20 | Ht 64.0 in | Wt 130.0 lb

## 2012-07-03 DIAGNOSIS — K625 Hemorrhage of anus and rectum: Secondary | ICD-10-CM

## 2012-07-03 DIAGNOSIS — D126 Benign neoplasm of colon, unspecified: Secondary | ICD-10-CM

## 2012-07-03 DIAGNOSIS — K648 Other hemorrhoids: Secondary | ICD-10-CM

## 2012-07-03 DIAGNOSIS — K5730 Diverticulosis of large intestine without perforation or abscess without bleeding: Secondary | ICD-10-CM | POA: Diagnosis not present

## 2012-07-03 DIAGNOSIS — I059 Rheumatic mitral valve disease, unspecified: Secondary | ICD-10-CM | POA: Diagnosis not present

## 2012-07-03 DIAGNOSIS — R002 Palpitations: Secondary | ICD-10-CM | POA: Diagnosis not present

## 2012-07-03 DIAGNOSIS — K649 Unspecified hemorrhoids: Secondary | ICD-10-CM | POA: Diagnosis not present

## 2012-07-03 DIAGNOSIS — I10 Essential (primary) hypertension: Secondary | ICD-10-CM | POA: Diagnosis not present

## 2012-07-03 HISTORY — DX: Benign neoplasm of colon, unspecified: D12.6

## 2012-07-03 MED ORDER — SODIUM CHLORIDE 0.9 % IV SOLN: 500.0000 mL | INTRAVENOUS | Status: DC

## 2012-07-03 NOTE — Progress Notes (Signed)
Propofol per scamp crna, all meds titrated per crna throughout procedure. See scanned intra procedure report. ewm

## 2012-07-03 NOTE — Op Note (Signed)
[facility] [address] [place], [place]  [address]  COLONOSCOPY PROCEDURE REPORT  PATIENT:  Amy Gilmore, Amy Gilmore  MR#:  [id number] BIRTHDATE:  29-Apr-1936, 76 yrs. old  GENDER:  female ENDOSCOPIST:  Barbette Hair. Arlyce Dice, MD REF. BY:  Vickii Penna, M.D. PROCEDURE DATE:  07/03/2012 PROCEDURE:  Colonoscopy with snare polypectomy ASA CLASS:  Class II INDICATIONS:  rectal bleeding MEDICATIONS:   MAC sedation, administered by CRNA propofol 250mg IV  DESCRIPTION OF PROCEDURE:   After the risks benefits and alternatives of the procedure were thoroughly explained, informed consent was obtained.  Digital rectal exam was performed and revealed no abnormalities.   The LB [id number]-[id number] endoscope was introduced through the anus and advanced to the cecum, which was identified by both the appendix and ileocecal valve, without limitations.  The quality of the prep was excellent, using MoviPrep.  The instrument was then slowly withdrawn as the colon was fully examined. <<PROCEDUREIMAGES>>  FINDINGS:  A sessile polyp was found in the ascending colon. It was 4 mm in size. Polyp was snared without cautery. Retrieval was successful (see image4). snare polyp  Moderate diverticulosis was found in the sigmoid colon (see image6).  Internal Hemorrhoids were found (see image7).  This was otherwise a normal examination of the colon (see image2).   Retroflexed views in the rectum revealed no abnormalities.    The time to cecum =  1) 4.0 minutes. The scope was then withdrawn in  1) 6.75  minutes from the cecum and the procedure completed. COMPLICATIONS:  None ENDOSCOPIC IMPRESSION: 1) 4 mm sessile polyp in the ascending colon 2) Moderate diverticulosis in the sigmoid colon 3) Internal hemorrhoids 4) Otherwise normal examination  Rectal bleeding secondary to hemorrhoids  RECOMMENDATIONS: 1) If the polyp(s) removed today are proven to be adenomatous (pre-cancerous) polyps, you will need a  repeat colonoscopy in 5 years. Otherwise you should continue to follow colorectal cancer screening guidelines for "routine risk" patients with colonoscopy in 10 years. You will receive a letter within 1-2 weeks with the results of your biopsy as well as final recommendations. Please call my office if you have not received a letter after 3 weeks. 2) Anusol HC supp. 3) Call office for follow-up appointment in 6 weeks REPEAT EXAM:  pending pathology  ______________________________ Barbette Hair. Arlyce Dice, MD  CC:  n. eSIGNED:   Barbette Hair. Kaplan at 07/03/2012 02:37 PM  Susa Simmonds, [phone]

## 2012-07-03 NOTE — Patient Instructions (Addendum)
Discharge instructions given with verbal understanding. Handouts on polyps,diverticulosis and hemorrhoids given. Resume previous medications. YOU HAD AN ENDOSCOPIC PROCEDURE TODAY AT THE [facility]: Refer to the procedure report that was given to you for any specific questions about what was found during the examination.  If the procedure report does not answer your questions, please call your gastroenterologist to clarify.  If you requested that your care partner not be given the details of your procedure findings, then the procedure report has been included in a sealed envelope for you to review at your convenience later.  YOU SHOULD EXPECT: Some feelings of bloating in the abdomen. Passage of more gas than usual.  Walking can help get rid of the air that was put into your GI tract during the procedure and reduce the bloating. If you had a lower endoscopy (such as a colonoscopy or flexible sigmoidoscopy) you may notice spotting of blood in your stool or on the toilet paper. If you underwent a bowel prep for your procedure, then you may not have a normal bowel movement for a few days.  DIET: Your first meal following the procedure should be a light meal and then it is ok to progress to your normal diet.  A half-sandwich or bowl of soup is an example of a good first meal.  Heavy or fried foods are harder to digest and may make you feel nauseous or bloated.  Likewise meals heavy in dairy and vegetables can cause extra gas to form and this can also increase the bloating.  Drink plenty of fluids but you should avoid alcoholic beverages for 24 hours.  ACTIVITY: Your care partner should take you home directly after the procedure.  You should plan to take it easy, moving slowly for the rest of the day.  You can resume normal activity the day after the procedure however you should NOT DRIVE or use heavy machinery for 24 hours (because of the sedation medicines used during the test).    SYMPTOMS TO  REPORT IMMEDIATELY: A gastroenterologist can be reached at any hour.  During normal business hours, 8:30 AM to 5:00 PM Monday through Friday, call [phone].  After hours and on weekends, please call the GI answering service at [phone] who will take a message and have the physician on call contact you.   Following lower endoscopy (colonoscopy or flexible sigmoidoscopy):  Excessive amounts of blood in the stool  Significant tenderness or worsening of abdominal pains  Swelling of the abdomen that is new, acute  Fever of 100F or higher  FOLLOW UP: If any biopsies were taken you will be contacted by phone or by letter within the next 1-3 weeks.  Call your gastroenterologist if you have not heard about the biopsies in 3 weeks.  Our staff will call the home number listed on your records the next business day following your procedure to check on you and address any questions or concerns that you may have at that time regarding the information given to you following your procedure. This is a courtesy call and so if there is no answer at the home number and we have not heard from you through the emergency physician on call, we will assume that you have returned to your regular daily activities without incident.  SIGNATURES/CONFIDENTIALITY: You and/or your care partner have signed paperwork which will be entered into your electronic medical record.  These signatures attest to the fact that that the information above on your After Visit   Summary has been reviewed and is understood.  Full responsibility of the confidentiality of this discharge information lies with you and/or your care-partner.  

## 2012-07-04 ENCOUNTER — Telehealth: Payer: Self-pay | Admitting: *Deleted

## 2012-07-04 NOTE — Telephone Encounter (Signed)
  Follow up Call-  Call back number 07/03/2012  Post procedure Call Back phone  # [phone]  Permission to leave phone message Yes     Patient questions:  Do you have a fever, pain , or abdominal swelling? no Pain Score  0 *  Have you tolerated food without any problems? yes  Have you been able to return to your normal activities? yes  Do you have any questions about your discharge instructions: Diet   no Medications  no Follow up visit  no  Do you have questions or concerns about your Care? no  Actions: * If pain score is 4 or above: No action needed, pain <4.

## 2012-07-09 ENCOUNTER — Encounter: Payer: Self-pay | Admitting: Gastroenterology

## 2012-07-21 DIAGNOSIS — E782 Mixed hyperlipidemia: Secondary | ICD-10-CM | POA: Diagnosis not present

## 2012-07-21 DIAGNOSIS — Z79899 Other long term (current) drug therapy: Secondary | ICD-10-CM | POA: Diagnosis not present

## 2012-07-21 DIAGNOSIS — I10 Essential (primary) hypertension: Secondary | ICD-10-CM | POA: Diagnosis not present

## 2012-08-06 ENCOUNTER — Encounter: Payer: Self-pay | Admitting: Gastroenterology

## 2012-08-06 ENCOUNTER — Ambulatory Visit (INDEPENDENT_AMBULATORY_CARE_PROVIDER_SITE_OTHER): Payer: Medicare Other | Admitting: Gastroenterology

## 2012-08-06 VITALS — BP 120/78 | HR 64 | Ht 63.0 in | Wt 141.0 lb

## 2012-08-06 DIAGNOSIS — K648 Other hemorrhoids: Secondary | ICD-10-CM | POA: Diagnosis not present

## 2012-08-06 DIAGNOSIS — Z860100 Personal history of colon polyps, unspecified: Secondary | ICD-10-CM | POA: Insufficient documentation

## 2012-08-06 NOTE — Assessment & Plan Note (Signed)
She has persistent hemorrhoidal bleeding despite medical therapy.  Recommendations #1 band ligation of hemorrhoids

## 2012-08-06 NOTE — Assessment & Plan Note (Signed)
Plan followup colonoscopy 2018 

## 2012-08-06 NOTE — Patient Instructions (Addendum)
Your Procedure is scheduled on 08/12/2012 at [facility] at 12:15pm Separate instructions have been given Flexible Sigmoidoscopy Your caregiver has ordered a flexible sigmoidoscopy. This is an exam to evaluate your lower colon. In this exam your colon is cleansed and a short fiber optic tube is inserted through your rectum and into your colon. The fiber optic scope (endoscope) is a short bundle of enclosed flexible small glass fibers. It transmits light to the area examined and images from that area to your caregiver. You do not have to worry about glass breakage in the endoscope. Discomfort is usually minimal. Sedatives and pain medications are generally not required. This exam helps to detect tumors (lumps), polyps, inflammation (swelling and soreness), and areas of bleeding. It may also be used to take biopsies. These are small pieces of tissue taken to examine under a microscope. LET YOUR CAREGIVER KNOW ABOUT:  Allergies.   Medications taken including herbs, eye drops, over the counter medications, and creams.   Use of steroids (by mouth or creams).   Previous problems with anesthetics or novocaine   Possibility of pregnancy, if this applies.   History of blood clots (thrombophlebitis).   History of bleeding or blood problems.   Previous surgery.   Other health problems.  BEFORE THE PROCEDURE Eat normally the night before the exam. Your caregiver may order a mild enema or laxative the night before. No eating or drinking should occur after midnight until the procedure is completed. A rectal suppository or enemas may be given in the morning prior to your procedure. You will be brought to the examination area in a hospital gown. You should be present 60 minutes prior to your procedure or as directed.  AFTER THE PROCEDURE   There is sometimes a little blood passed with the first bowel movement. Do not be concerned. Because air is often used during the exam, it is not unusual to pass gas  and experience abdominal (belly) cramping. Walking or a warm pack on your abdomen may help with this. Do not sleep with a heating pad as burns can occur.   You may resume all normal eating and activities.   Only take over-the-counter or prescription medicines for pain, discomfort, or fever as directed by your caregiver. Do not use aspirin or blood thinners if a biopsy (tissue sample) was taken. Consult your caregiver for medication usage if biopsies were taken.   Call for your results as instructed by your caregiver. Remember, it is your responsibility to obtain the results of your biopsy. Do not assume everything is fine because you do not hear from your caregiver.  SEEK IMMEDIATE MEDICAL CARE IF:  An oral temperature above 102 F (38.9 C) develops.   You pass large blood clots or fill a toilet with blood following the procedure. This may also occur 10 to 14 days following the procedure. It is more likely if a biopsy was taken.   You develop abdominal pain not relieved with medication or that is getting worse rather than better.  Document Released: 11/16/2000 Document Revised: 11/08/2011 Document Reviewed: 08/29/2005 [employer] Patient Information 2012 [place], [place].

## 2012-08-06 NOTE — Progress Notes (Signed)
History of Present Illness:  The patient returns following colonoscopy for evaluation of rectal bleeding.  An ascending colon adenomatous polyp was removed. Internal hemorrhoids were seen. Despite a course of Anusol HC suppositories she continues to have intermittent rectal bleeding consisting of bright red blood on the pad and mixed with stools. She has grade 3 hemorrhoids. She is without rectal pain.    Review of Systems: Pertinent positive and negative review of systems were noted in the above HPI section. All other review of systems were otherwise negative.    Current Medications, Allergies, Past Medical History, Past Surgical History, Family History and Social History were reviewed in [employer] electronic medical record  Vital signs were reviewed in today's medical record. Physical Exam: General: Well developed , well nourished, no acute distress

## 2012-08-12 ENCOUNTER — Encounter (HOSPITAL_COMMUNITY): Payer: Self-pay | Admitting: *Deleted

## 2012-08-12 ENCOUNTER — Ambulatory Visit (HOSPITAL_COMMUNITY)
Admission: RE | Admit: 2012-08-12 | Discharge: 2012-08-12 | Disposition: A | Discharge status: Home / Self Care | Payer: Medicare Other | Source: Ambulatory Visit | Attending: Gastroenterology | Admitting: Gastroenterology

## 2012-08-12 ENCOUNTER — Encounter (HOSPITAL_COMMUNITY)
Admission: RE | Disposition: A | Discharge status: Home / Self Care | Payer: Self-pay | Source: Ambulatory Visit | Attending: Gastroenterology

## 2012-08-12 DIAGNOSIS — K648 Other hemorrhoids: Secondary | ICD-10-CM | POA: Diagnosis not present

## 2012-08-12 DIAGNOSIS — Z86010 Personal history of colonic polyps: Secondary | ICD-10-CM

## 2012-08-12 DIAGNOSIS — K625 Hemorrhage of anus and rectum: Secondary | ICD-10-CM | POA: Insufficient documentation

## 2012-08-12 HISTORY — PX: FLEXIBLE SIGMOIDOSCOPY: SHX5431

## 2012-08-12 SURGERY — SIGMOIDOSCOPY, FLEXIBLE
Anesthesia: Moderate Sedation

## 2012-08-12 MED ORDER — MIDAZOLAM HCL 10 MG/2ML IJ SOLN
INTRAMUSCULAR | Status: AC
  Filled 2012-08-12: qty 4

## 2012-08-12 MED ORDER — DIPHENHYDRAMINE HCL 50 MG/ML IJ SOLN
INTRAMUSCULAR | Status: AC
  Filled 2012-08-12: qty 1

## 2012-08-12 MED ORDER — GLYCOPYRROLATE 0.2 MG/ML IJ SOLN
INTRAMUSCULAR | Status: AC
  Filled 2012-08-12: qty 1

## 2012-08-12 MED ORDER — FENTANYL CITRATE 0.05 MG/ML IJ SOLN
INTRAMUSCULAR | Status: AC
  Filled 2012-08-12: qty 4

## 2012-08-12 MED ORDER — MIDAZOLAM HCL 10 MG/2ML IJ SOLN
INTRAMUSCULAR | Status: DC | PRN
  Administered 2012-08-12 (×2): 2 mg via INTRAVENOUS

## 2012-08-12 MED ORDER — SODIUM CHLORIDE 0.9 % IV SOLN
INTRAVENOUS | Status: DC
  Administered 2012-08-12: 500 mL via INTRAVENOUS

## 2012-08-12 MED ORDER — FENTANYL CITRATE 0.05 MG/ML IJ SOLN
INTRAMUSCULAR | Status: DC | PRN
  Administered 2012-08-12 (×2): 25 ug via INTRAVENOUS

## 2012-08-12 NOTE — H&P (View-Only) (Signed)
History of Present Illness:  The patient returns following colonoscopy for evaluation of rectal bleeding.  An ascending colon adenomatous polyp was removed. Internal hemorrhoids were seen. Despite a course of Anusol HC suppositories she continues to have intermittent rectal bleeding consisting of bright red blood on the pad and mixed with stools. She has grade 3 hemorrhoids. She is without rectal pain.    Review of Systems: Pertinent positive and negative review of systems were noted in the above HPI section. All other review of systems were otherwise negative.    Current Medications, Allergies, Past Medical History, Past Surgical History, Family History and Social History were reviewed in  [facility] electronic medical record  Vital signs were reviewed in today's medical record. Physical Exam: General: Well developed , well nourished, no acute distress     

## 2012-08-12 NOTE — Op Note (Signed)
[facility] [address] [place], [address]   FLEXIBLE SIGMOIDOSCOPY PROCEDURE REPORT  PATIENT: Amy, Gilmore  MR#: [id number] BIRTHDATE: 1936/11/18 , 76  yrs. old GENDgrade 3 Female ENDOSCOPIST: Louis Meckel, MD REFERRED BY:    Andee Lineman, M.D. PROCEDURE DATE:  08/12/2012 PROCEDURE:   Hemorrhoidectomy via banding, clips or ligation ASA CLASS:   Class II INDICATIONS:therapy of for previously diagnosed hemorrhoids. MEDICATIONS: These medications were titrated to patient response per physician's verbal order, Fentanyl 50 mcg IV, and Versed 4 mg IV  DESCRIPTION OF PROCEDURE:   After the risks benefits and alternatives of the procedure were thoroughly explained, informed consent was obtained. Digital exam revealed grade 3 internal hemorrhoids. The Pentax Egd Scope  endoscope was introduced through the anus  and advanced to the sigmoid colon , limited by No adverse events experienced.   The quality of the prep was    .  The instrument was then slowly withdrawn as the mucosa was fully examined. in the retroflexed position 4 bands were placed just above the dentate line encompassing the 3 large internal hemorrhoid bundles       COLON FINDINGS: Large internal hemorrhoids were found. Retroflexed views revealed no abnormalities.    The scope was then withdrawn from the patient and the procedure terminated.  COMPLICATIONS: There were no complications.  ENDOSCOPIC IMPRESSION:internal hemorrhoids-status post band ligation  RECOMMENDATIONS:followup office visit   REPEAT EXAM:   _______________________________ eSignedLouis Meckel, MD 08/12/2012 1:22 PM   CC:

## 2012-08-12 NOTE — Interval H&P Note (Signed)
History and Physical Interval Note:  08/12/2012 12:47 PM  Amy Gilmore  has presented today for surgery, with the diagnosis of hemorrhoids  The various methods of treatment have been discussed with the patient and family. After consideration of risks, benefits and other options for treatment, the patient has consented to  Procedure(s) (LRB) with comments: FLEXIBLE SIGMOIDOSCOPY (N/A) HEMORRHOID BANDING (N/A) as a surgical intervention .  The patient's history has been reviewed, patient examined, no change in status, stable for surgery.  I have reviewed the patient's chart and labs.  Questions were answered to the patient's satisfaction.    The recent H&P (dated *08/06/12**) was reviewed, the patient was examined and there is no change in the patients condition since that H&P was completed.   [name]  08/12/2012, 12:47 PM    [name]

## 2012-08-13 ENCOUNTER — Encounter (HOSPITAL_COMMUNITY): Payer: Self-pay

## 2012-08-13 ENCOUNTER — Encounter (HOSPITAL_COMMUNITY): Payer: Self-pay | Admitting: Gastroenterology

## 2012-08-25 ENCOUNTER — Encounter: Payer: Self-pay | Admitting: *Deleted

## 2012-09-03 ENCOUNTER — Ambulatory Visit (INDEPENDENT_AMBULATORY_CARE_PROVIDER_SITE_OTHER): Payer: Medicare Other | Admitting: Cardiology

## 2012-09-03 ENCOUNTER — Encounter: Payer: Self-pay | Admitting: Cardiology

## 2012-09-03 VITALS — BP 140/84 | HR 56 | Ht 63.0 in | Wt 131.2 lb

## 2012-09-03 DIAGNOSIS — Z8679 Personal history of other diseases of the circulatory system: Secondary | ICD-10-CM | POA: Diagnosis not present

## 2012-09-03 DIAGNOSIS — I10 Essential (primary) hypertension: Secondary | ICD-10-CM

## 2012-09-03 DIAGNOSIS — R002 Palpitations: Secondary | ICD-10-CM

## 2012-09-03 DIAGNOSIS — E785 Hyperlipidemia, unspecified: Secondary | ICD-10-CM

## 2012-09-03 NOTE — Progress Notes (Signed)
HPI Amy Gilmore returns today for evaluation and management history of mitral valve prolapse, history of palpitations and PACs, hypertension and hyperlipidemia.  She is doing well from a cardiac standpoint. She denies any palpitations, chest discomfort, dyspnea on exertion. Propranolol is controlling her palpitations. She has not had to use when necessary diltiazem. She  denies orthopnea, PND or edema. She's had no syncope  Past Medical History  Diagnosis Date  . Hypertension   . Hyperlipidemia   . Palpitations hx pac's-   occasional  . Mitral valve prolapse per dr Ketih Goodie note 09/12--  stable  . Right shoulder pain Dec 07, 2011 fall    pain at times with positioning  . History of kidney stones   . Vertigo, intermittent   . Frequency of urination   . Urgency of micturation   . Diverticulosis   . Internal hemorrhoids   . Adenomatous colon polyp 07/03/2012  . PAC (premature atrial contraction) followed by cardiologist --  dr Braden Cimo--  last visit  sept 12  note in epic--  states stable  . Renal calculus, right followed by dr Vernie Ammons    February 2013    Current Outpatient Prescriptions  Medication Sig Dispense Refill  . acetaminophen (TYLENOL) 500 MG tablet Take 1,000 mg by mouth every 6 (six) hours as needed. Pain       . aspirin 81 MG tablet Take 81 mg by mouth daily.      Marland Kitchen atorvastatin (LIPITOR) 10 MG tablet Take 10 mg by mouth at bedtime.       Marland Kitchen diltiazem (CARDIZEM) 30 MG tablet Take 30-60 mg by mouth as needed. Take 1-2 tablets (30-60mg ) for increasing palpitations.      . Glucosamine 500 MG CAPS Take 500 mg by mouth daily.       . Hypromellose (GENTEAL) 0.3 % SOLN Place 1-2 drops into both eyes 2 (two) times daily as needed. Dry eyes       . meclizine (ANTIVERT) 25 MG tablet Take 25 mg by mouth as needed. Inner ear      . meloxicam (MOBIC) 7.5 MG tablet Take 7.5 mg by mouth as needed.      . Multiple Vitamins-Minerals (CENTRUM SILVER) tablet Take 1 tablet by mouth daily.         . propranolol (INDERAL LA) 60 MG 24 hr capsule Take 60 mg by mouth daily before breakfast.       . valsartan-hydrochlorothiazide (DIOVAN-HCT) 160-25 MG per tablet Take 1 tablet by mouth daily before breakfast.        Allergies  Allergen Reactions  . Codeine Nausea Only  . Sulfa Antibiotics Rash    Family History  Problem Relation Age of Onset  . Liver cancer Mother   . Heart attack Sister   . Peripheral vascular disease Sister   . Colon cancer Neg Hx     History   Social History  . Marital Status: Married    Spouse Name: N/A    Number of Children: 3  . Years of Education: N/A   Occupational History  . Housewife    Social History Main Topics  . Smoking status: Former Smoker -- 1.0 packs/day for 15 years    Quit date: 12/04/1975  . Smokeless tobacco: Never Used  . Alcohol Use: 4.2 oz/week    7 Glasses of wine per week     Socially  . Drug Use: No  . Sexually Active: Not on file   Other Topics Concern  . Not on  file   Social History Narrative   Married.Gets regular exercise.Caffeine daily. 3 cups of coffee or soda per day    ROS ALL NEGATIVE EXCEPT THOSE NOTED IN HPI  PE  General Appearance: well developed, well nourished in no acute distress HEENT: symmetrical face, PERRLA, good dentition  Neck: no JVD, thyromegaly, or adenopathy, trachea midline Chest: symmetric without deformity Cardiac: PMI non-displaced, RRR, normal S1, S2, no gallop, soft systolic murmur at the apex, no obvious click Lung: clear to ausculation and percussion Vascular: all pulses full without bruits  Abdominal: nondistended, nontender, good bowel sounds, no HSM, no bruits Extremities: no cyanosis, clubbing or edema, no sign of DVT, no varicosities  Skin: normal color, no rashes Neuro: alert and oriented x 3, non-focal Pysch: normal affect  EKG Not repeated BMET    Component Value Date/Time   NA 143 02/22/2012 0652   K 3.2* 02/22/2012 0652   CL 106 02/22/2012 0652   CO2 30  01/23/2012 1250   GLUCOSE 113* 02/22/2012 0652   BUN 15 02/22/2012 0652   CREATININE 0.70 02/22/2012 0652   CALCIUM 9.8 01/23/2012 1250   GFRNONAA 86* 01/23/2012 1250   GFRAA >90 01/23/2012 1250    Lipid Panel  No results found for this basename: chol, trig, hdl, cholhdl, vldl, ldlcalc    CBC    Component Value Date/Time   WBC 6.9 01/23/2012 1250   RBC 4.01 01/23/2012 1250   HGB 11.2* 02/22/2012 0652   HCT 33.0* 02/22/2012 0652   PLT 262 01/23/2012 1250   MCV 96.5 01/23/2012 1250   MCH 32.7 01/23/2012 1250   MCHC 33.9 01/23/2012 1250   RDW 13.0 01/23/2012 1250

## 2012-09-03 NOTE — Assessment & Plan Note (Signed)
Resolved on low-dose propranolol. No change in recommendations. Return the office when necessary or in one year.

## 2012-09-03 NOTE — Assessment & Plan Note (Signed)
Slightly elevated today. She states it's usually in the 130s. No change in recommendations.

## 2012-09-03 NOTE — Patient Instructions (Addendum)
Your physician recommends that you continue on your current medications as directed. Please refer to the Current Medication list given to you today.   Your physician wants you to follow-up in: 1 year with Dr. Wall. You will receive a reminder letter in the mail two months in advance. If you don't receive a letter, please call our office to schedule the follow-up appointment.  

## 2012-09-04 DIAGNOSIS — Z87442 Personal history of urinary calculi: Secondary | ICD-10-CM | POA: Diagnosis not present

## 2012-09-04 DIAGNOSIS — R3129 Other microscopic hematuria: Secondary | ICD-10-CM | POA: Diagnosis not present

## 2012-09-04 DIAGNOSIS — N200 Calculus of kidney: Secondary | ICD-10-CM | POA: Diagnosis not present

## 2012-09-15 ENCOUNTER — Other Ambulatory Visit: Payer: Self-pay | Admitting: Dermatology

## 2012-09-15 DIAGNOSIS — D1801 Hemangioma of skin and subcutaneous tissue: Secondary | ICD-10-CM | POA: Diagnosis not present

## 2012-09-15 DIAGNOSIS — L820 Inflamed seborrheic keratosis: Secondary | ICD-10-CM | POA: Diagnosis not present

## 2012-09-15 DIAGNOSIS — L570 Actinic keratosis: Secondary | ICD-10-CM | POA: Diagnosis not present

## 2012-09-15 DIAGNOSIS — L821 Other seborrheic keratosis: Secondary | ICD-10-CM | POA: Diagnosis not present

## 2012-09-15 DIAGNOSIS — D485 Neoplasm of uncertain behavior of skin: Secondary | ICD-10-CM | POA: Diagnosis not present

## 2012-09-15 DIAGNOSIS — D239 Other benign neoplasm of skin, unspecified: Secondary | ICD-10-CM | POA: Diagnosis not present

## 2012-09-18 ENCOUNTER — Encounter: Payer: Self-pay | Admitting: Gastroenterology

## 2012-09-18 ENCOUNTER — Ambulatory Visit (INDEPENDENT_AMBULATORY_CARE_PROVIDER_SITE_OTHER): Payer: Medicare Other | Admitting: Gastroenterology

## 2012-09-18 VITALS — BP 102/68 | HR 78 | Ht 63.0 in | Wt 129.4 lb

## 2012-09-18 DIAGNOSIS — K648 Other hemorrhoids: Secondary | ICD-10-CM | POA: Diagnosis not present

## 2012-09-18 NOTE — Patient Instructions (Addendum)
Follow up as needed

## 2012-09-18 NOTE — Progress Notes (Signed)
History of Present Illness:  The patient has returned following band ligation of hemorrhoids. Bleeding has entirely subsided. She is without pain.    Review of Systems: Pertinent positive and negative review of systems were noted in the above HPI section. All other review of systems were otherwise negative.    Current Medications, Allergies, Past Medical History, Past Surgical History, Family History and Social History were reviewed in [employer] electronic medical record  Vital signs were reviewed in today's medical record. Physical Exam: General: Well developed , well nourished, no acute distress

## 2012-09-18 NOTE — Assessment & Plan Note (Signed)
Asymptomatic following band ligation of hemorrhoids

## 2012-09-25 DIAGNOSIS — Z23 Encounter for immunization: Secondary | ICD-10-CM | POA: Diagnosis not present

## 2012-10-06 DIAGNOSIS — K219 Gastro-esophageal reflux disease without esophagitis: Secondary | ICD-10-CM | POA: Diagnosis not present

## 2012-10-06 DIAGNOSIS — I10 Essential (primary) hypertension: Secondary | ICD-10-CM | POA: Diagnosis not present

## 2012-10-06 DIAGNOSIS — E785 Hyperlipidemia, unspecified: Secondary | ICD-10-CM | POA: Diagnosis not present

## 2012-12-15 ENCOUNTER — Other Ambulatory Visit: Payer: Self-pay | Admitting: Gastroenterology

## 2012-12-15 DIAGNOSIS — H2510 Age-related nuclear cataract, unspecified eye: Secondary | ICD-10-CM | POA: Diagnosis not present

## 2012-12-15 MED ORDER — HYDROCORTISONE ACETATE 25 MG RE SUPP: 25.0000 mg | Freq: Two times a day (BID) | RECTAL | Status: DC

## 2012-12-15 NOTE — Telephone Encounter (Signed)
Anusol sent to pts pharmacy. Called pt to inform her med sent there was no answer

## 2013-01-21 DIAGNOSIS — R238 Other skin changes: Secondary | ICD-10-CM | POA: Insufficient documentation

## 2013-03-24 DIAGNOSIS — M7710 Lateral epicondylitis, unspecified elbow: Secondary | ICD-10-CM | POA: Diagnosis not present

## 2013-04-01 DIAGNOSIS — M76829 Posterior tibial tendinitis, unspecified leg: Secondary | ICD-10-CM | POA: Diagnosis not present

## 2013-04-02 DIAGNOSIS — M76829 Posterior tibial tendinitis, unspecified leg: Secondary | ICD-10-CM | POA: Diagnosis not present

## 2013-05-22 NOTE — Telephone Encounter (Signed)
error 

## 2013-06-29 ENCOUNTER — Ambulatory Visit: Payer: Self-pay | Admitting: Internal Medicine

## 2013-06-29 DIAGNOSIS — R922 Inconclusive mammogram: Secondary | ICD-10-CM | POA: Diagnosis not present

## 2013-06-29 DIAGNOSIS — R920 Mammographic microcalcification found on diagnostic imaging of breast: Secondary | ICD-10-CM | POA: Diagnosis not present

## 2013-06-29 DIAGNOSIS — Z1231 Encounter for screening mammogram for malignant neoplasm of breast: Secondary | ICD-10-CM | POA: Diagnosis not present

## 2013-07-22 DIAGNOSIS — E782 Mixed hyperlipidemia: Secondary | ICD-10-CM | POA: Diagnosis not present

## 2013-07-22 DIAGNOSIS — Z79899 Other long term (current) drug therapy: Secondary | ICD-10-CM | POA: Diagnosis not present

## 2013-07-22 DIAGNOSIS — I10 Essential (primary) hypertension: Secondary | ICD-10-CM | POA: Diagnosis not present

## 2013-07-22 DIAGNOSIS — M129 Arthropathy, unspecified: Secondary | ICD-10-CM | POA: Diagnosis not present

## 2013-08-06 DIAGNOSIS — J309 Allergic rhinitis, unspecified: Secondary | ICD-10-CM | POA: Diagnosis not present

## 2013-08-06 DIAGNOSIS — I10 Essential (primary) hypertension: Secondary | ICD-10-CM | POA: Diagnosis not present

## 2013-08-06 DIAGNOSIS — Z0000 Encounter for general adult medical examination without abnormal findings: Secondary | ICD-10-CM | POA: Diagnosis not present

## 2013-08-06 DIAGNOSIS — E782 Mixed hyperlipidemia: Secondary | ICD-10-CM | POA: Diagnosis not present

## 2013-08-06 DIAGNOSIS — K219 Gastro-esophageal reflux disease without esophagitis: Secondary | ICD-10-CM | POA: Diagnosis not present

## 2013-08-06 DIAGNOSIS — E2839 Other primary ovarian failure: Secondary | ICD-10-CM | POA: Diagnosis not present

## 2013-09-14 ENCOUNTER — Other Ambulatory Visit: Payer: Self-pay | Admitting: Dermatology

## 2013-09-14 DIAGNOSIS — D485 Neoplasm of uncertain behavior of skin: Secondary | ICD-10-CM | POA: Diagnosis not present

## 2013-09-14 DIAGNOSIS — D239 Other benign neoplasm of skin, unspecified: Secondary | ICD-10-CM | POA: Diagnosis not present

## 2013-09-14 DIAGNOSIS — L570 Actinic keratosis: Secondary | ICD-10-CM | POA: Diagnosis not present

## 2013-09-14 DIAGNOSIS — L851 Acquired keratosis [keratoderma] palmaris et plantaris: Secondary | ICD-10-CM | POA: Diagnosis not present

## 2013-09-14 DIAGNOSIS — L821 Other seborrheic keratosis: Secondary | ICD-10-CM | POA: Diagnosis not present

## 2013-09-22 DIAGNOSIS — Z23 Encounter for immunization: Secondary | ICD-10-CM | POA: Diagnosis not present

## 2013-12-16 DIAGNOSIS — H2510 Age-related nuclear cataract, unspecified eye: Secondary | ICD-10-CM | POA: Diagnosis not present

## 2014-02-02 DIAGNOSIS — K219 Gastro-esophageal reflux disease without esophagitis: Secondary | ICD-10-CM | POA: Diagnosis not present

## 2014-02-02 DIAGNOSIS — R221 Localized swelling, mass and lump, neck: Secondary | ICD-10-CM | POA: Diagnosis not present

## 2014-02-02 DIAGNOSIS — I889 Nonspecific lymphadenitis, unspecified: Secondary | ICD-10-CM | POA: Diagnosis not present

## 2014-02-02 DIAGNOSIS — I059 Rheumatic mitral valve disease, unspecified: Secondary | ICD-10-CM | POA: Diagnosis not present

## 2014-02-02 DIAGNOSIS — M159 Polyosteoarthritis, unspecified: Secondary | ICD-10-CM | POA: Diagnosis not present

## 2014-02-02 DIAGNOSIS — I10 Essential (primary) hypertension: Secondary | ICD-10-CM | POA: Diagnosis not present

## 2014-02-02 DIAGNOSIS — E782 Mixed hyperlipidemia: Secondary | ICD-10-CM | POA: Diagnosis not present

## 2014-02-02 DIAGNOSIS — R220 Localized swelling, mass and lump, head: Secondary | ICD-10-CM | POA: Diagnosis not present

## 2014-02-02 DIAGNOSIS — J309 Allergic rhinitis, unspecified: Secondary | ICD-10-CM | POA: Diagnosis not present

## 2014-02-04 DIAGNOSIS — M799 Soft tissue disorder, unspecified: Secondary | ICD-10-CM | POA: Diagnosis not present

## 2014-02-10 ENCOUNTER — Ambulatory Visit: Payer: Medicare Other | Admitting: Cardiovascular Disease

## 2014-02-17 ENCOUNTER — Ambulatory Visit: Payer: Medicare Other | Admitting: Cardiovascular Disease

## 2014-02-26 ENCOUNTER — Ambulatory Visit (INDEPENDENT_AMBULATORY_CARE_PROVIDER_SITE_OTHER): Payer: Medicare Other | Admitting: Cardiovascular Disease

## 2014-02-26 ENCOUNTER — Encounter: Payer: Self-pay | Admitting: Cardiovascular Disease

## 2014-02-26 VITALS — BP 130/82 | HR 54 | Ht 64.0 in | Wt 129.0 lb

## 2014-02-26 DIAGNOSIS — I341 Nonrheumatic mitral (valve) prolapse: Secondary | ICD-10-CM

## 2014-02-26 DIAGNOSIS — I059 Rheumatic mitral valve disease, unspecified: Secondary | ICD-10-CM

## 2014-02-26 DIAGNOSIS — Z8679 Personal history of other diseases of the circulatory system: Secondary | ICD-10-CM

## 2014-02-26 DIAGNOSIS — R002 Palpitations: Secondary | ICD-10-CM | POA: Diagnosis not present

## 2014-02-26 DIAGNOSIS — I10 Essential (primary) hypertension: Secondary | ICD-10-CM | POA: Diagnosis not present

## 2014-02-26 DIAGNOSIS — E785 Hyperlipidemia, unspecified: Secondary | ICD-10-CM

## 2014-02-26 NOTE — Progress Notes (Signed)
Patient ID: Amy Gilmore, female    DOB: 1936-01-30, 78 y.o.   MRN: [id number]  HPI Comments: Amy Gilmore is a very pleasant 78 year old woman with notes indicating a history of mitral valve prolapse, history of palpitations and PACs, hypertension and hyperlipidemia. Previously seen by Dr. Verl Blalock. Last echocardiogram in 2011 did not mention mitral valve prolapse but did suggest mild to moderate MR, normal ejection fraction. Prior smoking history, stopped 30 years ago  She is doing well from a cardiac standpoint. She denies any palpitations, chest discomfort, dyspnea on exertion. Propranolol is controlling her palpitations. She has not had to use  diltiazem. She  denies orthopnea, PND or edema. She's had no syncope. She walks daily, plays golf on a regular basis, walks her dog but no symptoms.  Tolerating low-dose Lipitor. She prefers brand name versus generic.  EKG shows normal sinus rhythm with rate 54 beats per minute, no significant ST or T wave changes     Outpatient Encounter Prescriptions as of 02/26/2014  Medication Sig  . acetaminophen (TYLENOL) 500 MG tablet Take 1,000 mg by mouth every 6 (six) hours as needed. Pain   . aspirin 81 MG tablet Take 81 mg by mouth daily.  Marland Kitchen atorvastatin (LIPITOR) 10 MG tablet Take 10 mg by mouth at bedtime.   Marland Kitchen diltiazem (CARDIZEM) 30 MG tablet Take 30-60 mg by mouth as needed. Take 1-2 tablets (30-60mg ) for increasing palpitations.  . Glucosamine 500 MG CAPS Take 500 mg by mouth daily.   . hydrocortisone (ANUSOL-HC) 25 MG suppository Place 1 suppository (25 mg total) rectally every 12 (twelve) hours.  . Hypromellose (GENTEAL) 0.3 % SOLN Place 1-2 drops into both eyes 2 (two) times daily as needed. Dry eyes   . meclizine (ANTIVERT) 25 MG tablet Take 25 mg by mouth as needed. Inner ear  . meloxicam (MOBIC) 7.5 MG tablet Take 7.5 mg by mouth as needed.  . Multiple Vitamins-Minerals (CENTRUM SILVER) tablet Take 1 tablet by mouth daily.   .  propranolol (INDERAL LA) 60 MG 24 hr capsule Take 60 mg by mouth daily before breakfast.   . valsartan-hydrochlorothiazide (DIOVAN-HCT) 160-12.5 MG per tablet Take 1 tablet by mouth daily.    Review of Systems  Constitutional: Negative.   HENT: Negative.   Eyes: Negative.   Respiratory: Negative.   Cardiovascular: Negative.   Gastrointestinal: Negative.   Endocrine: Negative.   Musculoskeletal: Negative.   Skin: Negative.   Allergic/Immunologic: Negative.   Neurological: Negative.   Hematological: Negative.   Psychiatric/Behavioral: Negative.   All other systems reviewed and are negative.    BP 130/82  Pulse 54  Ht 5\' 4"  (1.626 m)  Wt 129 lb (58.514 kg)  BMI 22.13 kg/m2  Physical Exam  Nursing note and vitals reviewed. Constitutional: She is oriented to person, place, and time. She appears well-developed and well-nourished.  HENT:  Head: Normocephalic.  Nose: Nose normal.  Mouth/Throat: Oropharynx is clear and moist.  Eyes: Conjunctivae are normal. Pupils are equal, round, and reactive to light.  Neck: Normal range of motion. Neck supple. No JVD present.  Cardiovascular: Normal rate, regular rhythm, S1 normal, S2 normal, normal heart sounds and intact distal pulses.  Exam reveals no gallop and no friction rub.   No murmur heard. Pulmonary/Chest: Effort normal and breath sounds normal. No respiratory distress. She has no wheezes. She has no rales. She exhibits no tenderness.  Abdominal: Soft. Bowel sounds are normal. She exhibits no distension. There is no tenderness.  Musculoskeletal: Normal  range of motion. She exhibits no edema and no tenderness.  Lymphadenopathy:    She has no cervical adenopathy.  Neurological: She is alert and oriented to person, place, and time. Coordination normal.  Skin: Skin is warm and dry. No rash noted. No erythema.  Psychiatric: She has a normal mood and affect. Her behavior is normal. Judgment and thought content normal.    Assessment  and Plan

## 2014-02-26 NOTE — Patient Instructions (Signed)
You are doing well. No medication changes were made.  Please call us if you have new issues that need to be addressed before your next appt.  Your physician wants you to follow-up in: 12 months.  You will receive a reminder letter in the mail two months in advance. If you don't receive a letter, please call our office to schedule the follow-up appointment. 

## 2014-02-26 NOTE — Assessment & Plan Note (Signed)
Well controlled palpitations on propranolol daily

## 2014-02-26 NOTE — Assessment & Plan Note (Signed)
No mitral bowel prolapse on echocardiogram in 2011. There was mention of mild to moderate MR. No murmur appreciated on exam. No further workup at this time

## 2014-02-26 NOTE — Assessment & Plan Note (Signed)
Blood pressure is well controlled on today's visit. No changes made to the medications. 

## 2014-02-26 NOTE — Assessment & Plan Note (Signed)
We'll try to obtain the most recent lipid panels from Dr. Clayborn Bigness.

## 2014-05-24 DIAGNOSIS — J309 Allergic rhinitis, unspecified: Secondary | ICD-10-CM | POA: Diagnosis not present

## 2014-05-24 DIAGNOSIS — J069 Acute upper respiratory infection, unspecified: Secondary | ICD-10-CM | POA: Diagnosis not present

## 2014-05-24 DIAGNOSIS — I889 Nonspecific lymphadenitis, unspecified: Secondary | ICD-10-CM | POA: Diagnosis not present

## 2014-06-30 ENCOUNTER — Ambulatory Visit: Payer: Self-pay | Admitting: Internal Medicine

## 2014-06-30 DIAGNOSIS — Z1231 Encounter for screening mammogram for malignant neoplasm of breast: Secondary | ICD-10-CM | POA: Diagnosis not present

## 2014-07-16 DIAGNOSIS — E782 Mixed hyperlipidemia: Secondary | ICD-10-CM | POA: Diagnosis not present

## 2014-07-16 DIAGNOSIS — I10 Essential (primary) hypertension: Secondary | ICD-10-CM | POA: Diagnosis not present

## 2014-07-16 DIAGNOSIS — Z79899 Other long term (current) drug therapy: Secondary | ICD-10-CM | POA: Diagnosis not present

## 2014-07-29 DIAGNOSIS — E785 Hyperlipidemia, unspecified: Secondary | ICD-10-CM | POA: Diagnosis not present

## 2014-07-29 DIAGNOSIS — R4589 Other symptoms and signs involving emotional state: Secondary | ICD-10-CM | POA: Diagnosis not present

## 2014-07-29 DIAGNOSIS — Z0000 Encounter for general adult medical examination without abnormal findings: Secondary | ICD-10-CM | POA: Diagnosis not present

## 2014-07-29 DIAGNOSIS — M159 Polyosteoarthritis, unspecified: Secondary | ICD-10-CM | POA: Diagnosis not present

## 2014-07-29 DIAGNOSIS — I10 Essential (primary) hypertension: Secondary | ICD-10-CM | POA: Diagnosis not present

## 2014-08-20 DIAGNOSIS — M2020 Hallux rigidus, unspecified foot: Secondary | ICD-10-CM | POA: Diagnosis not present

## 2014-10-12 ENCOUNTER — Other Ambulatory Visit: Payer: Self-pay | Admitting: Dermatology

## 2014-10-12 DIAGNOSIS — D485 Neoplasm of uncertain behavior of skin: Secondary | ICD-10-CM | POA: Diagnosis not present

## 2014-10-12 DIAGNOSIS — D1801 Hemangioma of skin and subcutaneous tissue: Secondary | ICD-10-CM | POA: Diagnosis not present

## 2014-10-12 DIAGNOSIS — L820 Inflamed seborrheic keratosis: Secondary | ICD-10-CM | POA: Diagnosis not present

## 2014-10-12 DIAGNOSIS — L821 Other seborrheic keratosis: Secondary | ICD-10-CM | POA: Diagnosis not present

## 2014-10-12 DIAGNOSIS — L570 Actinic keratosis: Secondary | ICD-10-CM | POA: Diagnosis not present

## 2014-10-22 DIAGNOSIS — Z23 Encounter for immunization: Secondary | ICD-10-CM | POA: Diagnosis not present

## 2014-12-15 DIAGNOSIS — H2513 Age-related nuclear cataract, bilateral: Secondary | ICD-10-CM | POA: Diagnosis not present

## 2015-02-01 DIAGNOSIS — M25579 Pain in unspecified ankle and joints of unspecified foot: Secondary | ICD-10-CM | POA: Diagnosis not present

## 2015-02-01 DIAGNOSIS — R262 Difficulty in walking, not elsewhere classified: Secondary | ICD-10-CM | POA: Diagnosis not present

## 2015-02-10 DIAGNOSIS — M2141 Flat foot [pes planus] (acquired), right foot: Secondary | ICD-10-CM | POA: Diagnosis not present

## 2015-02-10 DIAGNOSIS — M2142 Flat foot [pes planus] (acquired), left foot: Secondary | ICD-10-CM | POA: Diagnosis not present

## 2015-02-10 DIAGNOSIS — I739 Peripheral vascular disease, unspecified: Secondary | ICD-10-CM | POA: Diagnosis not present

## 2015-02-10 DIAGNOSIS — M79672 Pain in left foot: Secondary | ICD-10-CM | POA: Diagnosis not present

## 2015-02-14 ENCOUNTER — Telehealth: Payer: Self-pay | Admitting: *Deleted

## 2015-02-14 ENCOUNTER — Ambulatory Visit: Payer: PRIVATE HEALTH INSURANCE | Admitting: Cardiovascular Disease

## 2015-02-14 NOTE — Telephone Encounter (Signed)
Called patient to reschedule and pt would call us back I offered her out next appointment which was march 28 th She stated that would not work, for she was going out of town Then I offered her the following week and she said this was not right. That we should have been able to fit her in.  Then she stated she has been having "pains in her chest" and it could be indigestion but when I offered her an appointment with PA she said no she would just call us back when she gets back into town.    **UPDATE** She just called and Rescheduled the appointment to the 28 th of march and will be placed on wait list

## 2015-02-15 ENCOUNTER — Ambulatory Visit (INDEPENDENT_AMBULATORY_CARE_PROVIDER_SITE_OTHER): Payer: Medicare Other | Admitting: Gastroenterology

## 2015-02-15 ENCOUNTER — Encounter: Payer: Self-pay | Admitting: Gastroenterology

## 2015-02-15 VITALS — HR 60 | Ht 64.0 in | Wt 127.8 lb

## 2015-02-15 DIAGNOSIS — R1033 Periumbilical pain: Secondary | ICD-10-CM | POA: Insufficient documentation

## 2015-02-15 DIAGNOSIS — K648 Other hemorrhoids: Secondary | ICD-10-CM | POA: Diagnosis not present

## 2015-02-15 MED ORDER — HYOSCYAMINE SULFATE 0.125 MG SL SUBL: 0.2500 mg | SUBLINGUAL_TABLET | SUBLINGUAL | Status: DC | PRN

## 2015-02-15 NOTE — Progress Notes (Signed)
_                                                                                                                History of Present Illness:  Amy Gilmore is here complaining of very vague abdominal pain and a bleeding hemorrhoid.  For a couple of months she's had poorly described periumbilical discomfort that is unrelated to eating or bowel movements.  It occurs spontaneously and occasionally wakens her.  It is very low-grade and described as more of an awareness of some discomfort, perhaps burning, rather than pain per se.  There is been no change in bowel habits.  She complains of excess gas from both ends.  In 2013 small adenomatous polyp was removed at colonoscopy.  Internal hemorrhoids were band ligated.  She complains of one protruding hemorrhoid that causes bleeding.  She has to manually reduce it.     Past Medical History  Diagnosis Date  . Hypertension   . Hyperlipidemia   . Palpitations hx pac's-   occasional  . Mitral valve prolapse per dr wall note 09/12--  stable  . Right shoulder pain Dec 07, 2011 fall    pain at times with positioning  . History of kidney stones   . Vertigo, intermittent   . Frequency of urination   . Urgency of micturation   . Diverticulosis   . Internal hemorrhoids   . Adenomatous colon polyp 07/03/2012  . PAC (premature atrial contraction) followed by cardiologist --  dr wall--  last visit  sept 12  note in epic--  states stable  . Renal calculus, right followed by dr Karsten Ro    February 2013   Past Surgical History  Procedure Laterality Date  . Rotator cuff repair  1997 and 2006    right  . Bunionectomy  1995    and osteotomy  . Appendectomy  1955  . Refractive surgery  yrs ago  . Nephrolithotomy  01/28/2012-  right ureteral stent placement    Procedure: NEPHROLITHOTOMY PERCUTANEOUS;  Surgeon: Claybon Jabs, MD;  Location: [facility];  Service: Urology;  Laterality: Right;  . Vaginal hysterectomy  1985  . Ureteroscopy   02/22/2012    Procedure: URETEROSCOPY;  Surgeon: Claybon Jabs, MD;  Location: [facility];  Service: Urology;  Laterality: Right;  URETEROSCOPY WITH STONE EXTRACTION   . Cystoscopy  02/22/2012    Procedure: CYSTOSCOPY;  Surgeon: Claybon Jabs, MD;  Location: [facility];  Service: Urology;  Laterality: Right;  removal of ureteral stent  . Other surgical history  01/28/12    right percutaneous nephroliathothoma  . Flexible sigmoidoscopy  08/12/2012    Procedure: FLEXIBLE SIGMOIDOSCOPY;  Surgeon: Inda Castle, MD;  Location: [facility];  Service: Endoscopy;  Laterality: N/A;   family history includes Heart attack in her sister; Liver cancer in her mother; Peripheral vascular disease in her sister. There is no history of Colon cancer. Current Outpatient  Prescriptions  Medication Sig Dispense Refill  . acetaminophen (TYLENOL) 500 MG tablet Take 1,000 mg by mouth every 6 (six) hours as needed. Pain     . aspirin 81 MG tablet Take 81 mg by mouth daily.    Marland Kitchen atorvastatin (LIPITOR) 10 MG tablet Take 10 mg by mouth at bedtime.     Marland Kitchen diltiazem (CARDIZEM) 30 MG tablet Take 30-60 mg by mouth as needed. Take 1-2 tablets (30-60mg ) for increasing palpitations.    . Glucosamine 500 MG CAPS Take 500 mg by mouth daily.     . hydrocortisone (ANUSOL-HC) 25 MG suppository Place 1 suppository (25 mg total) rectally every 12 (twelve) hours. 12 suppository 1  . Hypromellose (GENTEAL) 0.3 % SOLN Place 1-2 drops into both eyes 2 (two) times daily as needed. Dry eyes     . meclizine (ANTIVERT) 25 MG tablet Take 25 mg by mouth as needed. Inner ear    . meloxicam (MOBIC) 7.5 MG tablet Take 7.5 mg by mouth as needed.    . Multiple Vitamins-Minerals (CENTRUM SILVER) tablet Take 1 tablet by mouth daily.     . propranolol (INDERAL LA) 60 MG 24 hr capsule Take 60 mg by mouth daily before breakfast.     . valsartan-hydrochlorothiazide (DIOVAN-HCT) 160-12.5 MG per tablet Take 1 tablet by  mouth daily.     No current facility-administered medications for this visit.   Allergies as of 02/15/2015 - Review Complete 02/15/2015  Allergen Reaction Noted  . Codeine Nausea Only   . Sulfa antibiotics Rash 01/21/2012    reports that she quit smoking about 39 years ago. She has never used smokeless tobacco. She reports that she drinks about 4.2 oz of alcohol per week. She reports that she does not use illicit drugs.   Review of Systems: Pertinent positive and negative review of systems were noted in the above HPI section. All other review of systems were otherwise negative.  Vital signs were reviewed in today's medical record Physical Exam: General: Well developed , well nourished, no acute distress Skin: anicteric Head: Normocephalic and atraumatic Eyes:  sclerae anicteric, EOMI Ears: Normal auditory acuity Mouth: No deformity or lesions Neck: Supple, no masses or thyromegaly Lungs: Clear throughout to auscultation Heart: Regular rate and rhythm; no murmurs, rubs or bruits Abdomen: Soft, non tender and non distended. No masses, hepatosplenomegaly or hernias noted. Normal Bowel sounds Rectal: there is a grade 3 hemorrhoid in the right posterior position Musculoskeletal: Symmetrical with no gross deformities  Skin: No lesions on visible extremities Pulses:  Normal pulses noted Extremities: No clubbing, cyanosis, edema or deformities noted Neurological: Alert oriented x 4, grossly nonfocal Cervical Nodes:  No significant cervical adenopathy Inguinal Nodes: No significant inguinal adenopathy Psychological:  Alert and cooperative. Normal mood and affect  Anoscopy was performed.  There is a hemorrhoid in the right posterior position and the right anterior position.  The latter is smaller and is not protruding   See Assessment and Plan under Problem List

## 2015-02-15 NOTE — Patient Instructions (Signed)
HEMORRHOID BANDING PROCEDURE    FOLLOW-UP CARE   1. The procedure you have had should have been relatively painless since the banding of the area involved does not have nerve endings and there is no pain sensation.  The rubber band cuts off the blood supply to the hemorrhoid and the band may fall off as soon as 48 hours after the banding (the band may occasionally be seen in the toilet bowl following a bowel movement). You may notice a temporary feeling of fullness in the rectum which should respond adequately to plain Tylenol or Motrin.  2. Following the banding, avoid strenuous exercise that evening and resume full activity the next day.  A sitz bath (soaking in a warm tub) or bidet is soothing, and can be useful for cleansing the area after bowel movements.     3. To avoid constipation, take two tablespoons of natural wheat bran, natural oat bran, flax, Benefiber or any over the counter fiber supplement and increase your water intake to 7-8 glasses daily.    4. Unless you have been prescribed anorectal medication, do not put anything inside your rectum for two weeks: No suppositories, enemas, fingers, etc.  5. Occasionally, you may have more bleeding than usual after the banding procedure.  This is often from the untreated hemorrhoids rather than the treated one.  Don't be concerned if there is a tablespoon or so of blood.  If there is more blood than this, lie flat with your bottom higher than your head and apply an ice pack to the area. If the bleeding does not stop within a half an hour or if you feel faint, call our office at [phone] or go to the emergency room.  6. Problems are not common; however, if there is a substantial amount of bleeding, severe pain, chills, fever or difficulty passing urine (very rare) or other problems, you should call us at [phone] or report to the nearest emergency room.  7. Do not stay seated continuously for more than 2-3 hours for a day or two  after the procedure.  Tighten your buttock muscles 10-15 times every two hours and take 10-15 deep breaths every 1-2 hours.  Do not spend more than a few minutes on the toilet if you cannot empty your bowel; instead re-visit the toilet at a later time.    We will see you at your next appointment May 5th at 2:00pm.    I appreciate the opportunity to care for you.

## 2015-02-15 NOTE — Assessment & Plan Note (Signed)
PROCEDURE NOTE: The patient presents with symptomatic grade *3**  hemorrhoids, requesting rubber band ligation of his/her hemorrhoidal disease.  All risks, benefits and alternative forms of therapy were described and informed consent was obtained.   The anorectum was pre-medicated with lubricant and nitroglycerine ointment The decision was made to band the *right posterior** internal hemorrhoid, and the Hooper was used to perform band ligation without complication.  Digital anorectal examination was then performed to assure proper positioning of the band, and to adjust the banded tissue as required.  The patient was discharged home without pain or other issues.  Dietary and behavioral recommendations were given and along with follow-up instructions.    The patient will return in *4** weeks for  follow-up and possible additional banding as required. No complications were encountered and the patient tolerated the procedure well.

## 2015-02-15 NOTE — Assessment & Plan Note (Signed)
2-3 month history of very vague abdominal pain.  There are no long-term features.  Plan to try hyomax as needed.

## 2015-02-16 DIAGNOSIS — R262 Difficulty in walking, not elsewhere classified: Secondary | ICD-10-CM | POA: Diagnosis not present

## 2015-02-16 DIAGNOSIS — M25579 Pain in unspecified ankle and joints of unspecified foot: Secondary | ICD-10-CM | POA: Diagnosis not present

## 2015-02-18 DIAGNOSIS — M25579 Pain in unspecified ankle and joints of unspecified foot: Secondary | ICD-10-CM | POA: Diagnosis not present

## 2015-02-18 DIAGNOSIS — R262 Difficulty in walking, not elsewhere classified: Secondary | ICD-10-CM | POA: Diagnosis not present

## 2015-02-21 DIAGNOSIS — M25579 Pain in unspecified ankle and joints of unspecified foot: Secondary | ICD-10-CM | POA: Diagnosis not present

## 2015-02-21 DIAGNOSIS — R262 Difficulty in walking, not elsewhere classified: Secondary | ICD-10-CM | POA: Diagnosis not present

## 2015-02-24 DIAGNOSIS — R262 Difficulty in walking, not elsewhere classified: Secondary | ICD-10-CM | POA: Diagnosis not present

## 2015-02-24 DIAGNOSIS — M25579 Pain in unspecified ankle and joints of unspecified foot: Secondary | ICD-10-CM | POA: Diagnosis not present

## 2015-02-28 ENCOUNTER — Encounter: Payer: Self-pay | Admitting: Cardiovascular Disease

## 2015-02-28 ENCOUNTER — Ambulatory Visit (INDEPENDENT_AMBULATORY_CARE_PROVIDER_SITE_OTHER): Payer: Medicare Other | Admitting: Cardiovascular Disease

## 2015-02-28 VITALS — BP 120/72 | HR 66 | Ht 63.0 in | Wt 128.0 lb

## 2015-02-28 DIAGNOSIS — I341 Nonrheumatic mitral (valve) prolapse: Secondary | ICD-10-CM

## 2015-02-28 DIAGNOSIS — I10 Essential (primary) hypertension: Secondary | ICD-10-CM

## 2015-02-28 DIAGNOSIS — E785 Hyperlipidemia, unspecified: Secondary | ICD-10-CM | POA: Diagnosis not present

## 2015-02-28 DIAGNOSIS — R079 Chest pain, unspecified: Secondary | ICD-10-CM | POA: Insufficient documentation

## 2015-02-28 DIAGNOSIS — R0789 Other chest pain: Secondary | ICD-10-CM

## 2015-02-28 DIAGNOSIS — R002 Palpitations: Secondary | ICD-10-CM | POA: Diagnosis not present

## 2015-02-28 NOTE — Assessment & Plan Note (Signed)
Symptoms likely secondary to musculoskeletal. Discussed typical anginal symptoms with her. Her symptoms present at rest. Otherwise she has been active with no reproducible pain. We did discuss various treatment options, different kinds of stress testing if she has worsening pain. Recommended she call us if symptoms recur typically with exertion

## 2015-02-28 NOTE — Progress Notes (Signed)
Patient ID: Amy Gilmore, female    DOB: [birth year], 79 y.o.   MRN: [id number]  HPI Comments: Amy Gilmore is a very pleasant 79 year old woman with notes indicating a history of mitral valve prolapse, history of palpitations and PACs, hypertension and hyperlipidemia. Previously seen by Dr. Verl Blalock. Last echocardiogram in 2011 did not mention mitral valve prolapse but did suggest mild to moderate MR, normal ejection fraction. Prior smoking history, stopped 30 years ago She presents today for routine follow-up  She reports having pain underneath her left breast starting one month ago described as a stabbing pain lasting for a second or 2. She has had 3-4 episodes total. Typically presenting at rest, not with exertion. She is otherwise been a active person, no regular exercise program but does stay very active. She denies any symptoms when she is active  Minimal leg edema Denies having palpitations. Blood pressure ultimately well-controlled on her current medication regimen Lab work from primary care showing total cholesterol 159, LDL 80 EKG on today's visit shows normal sinus rhythm with rate 66 bpm, no significant ST or T-wave changes She has seen GI in the past for abdominal pain. Continues to have rare episodes of abdominal pain   Allergies  Allergen Reactions  . Codeine Nausea Only  . Sulfa Antibiotics Rash    Outpatient Encounter Prescriptions as of 02/28/2015  Medication Sig  . acetaminophen (TYLENOL) 500 MG tablet Take 1,000 mg by mouth every 6 (six) hours as needed. Pain   . aspirin 81 MG tablet Take 81 mg by mouth daily.  Marland Kitchen atorvastatin (LIPITOR) 10 MG tablet Take 10 mg by mouth at bedtime.   Marland Kitchen diltiazem (CARDIZEM) 30 MG tablet Take 30-60 mg by mouth as needed. Take 1-2 tablets (30-60mg ) for increasing palpitations.  . Glucosamine 500 MG CAPS Take 500 mg by mouth daily.   . hyoscyamine (LEVSIN SL) 0.125 MG SL tablet Place 2 tablets (0.25 mg total) under the tongue  every 4 (four) hours as needed.  . Hypromellose (GENTEAL) 0.3 % SOLN Place 1-2 drops into both eyes 2 (two) times daily as needed. Dry eyes   . meclizine (ANTIVERT) 25 MG tablet Take 25 mg by mouth as needed. Inner ear  . meloxicam (MOBIC) 7.5 MG tablet Take 7.5 mg by mouth as needed.  . Multiple Vitamins-Minerals (CENTRUM SILVER) tablet Take 1 tablet by mouth daily.   . propranolol (INDERAL LA) 60 MG 24 hr capsule Take 60 mg by mouth daily before breakfast.   . valsartan-hydrochlorothiazide (DIOVAN-HCT) 160-12.5 MG per tablet Take 1 tablet by mouth daily.  . [DISCONTINUED] hydrocortisone (ANUSOL-HC) 25 MG suppository Place 1 suppository (25 mg total) rectally every 12 (twelve) hours. (Patient not taking: Reported on 02/28/2015)    Past Medical History  Diagnosis Date  . Hypertension   . Hyperlipidemia   . Palpitations hx pac's-   occasional  . Mitral valve prolapse per dr wall note 09/12--  stable  . Right shoulder pain Dec 07, 2011 fall    pain at times with positioning  . History of kidney stones   . Vertigo, intermittent   . Frequency of urination   . Urgency of micturation   . Diverticulosis   . Internal hemorrhoids   . Adenomatous colon polyp 07/03/2012  . PAC (premature atrial contraction) followed by cardiologist --  dr wall--  last visit  sept 12  note in epic--  states stable  . Renal calculus, right followed by dr Karsten Ro    February 2013  Past Surgical History  Procedure Laterality Date  . Rotator cuff repair  1997 and 2006    right  . Bunionectomy  1995    and osteotomy  . Appendectomy  1955  . Refractive surgery  yrs ago  . Nephrolithotomy  01/28/2012-  right ureteral stent placement    Procedure: NEPHROLITHOTOMY PERCUTANEOUS;  Surgeon: Claybon Jabs, MD;  Location: [facility];  Service: Urology;  Laterality: Right;  . Vaginal hysterectomy  1985  . Ureteroscopy  02/22/2012    Procedure: URETEROSCOPY;  Surgeon: Claybon Jabs, MD;  Location: [facility];  Service: Urology;  Laterality: Right;  URETEROSCOPY WITH STONE EXTRACTION   . Cystoscopy  02/22/2012    Procedure: CYSTOSCOPY;  Surgeon: Claybon Jabs, MD;  Location: [facility];  Service: Urology;  Laterality: Right;  removal of ureteral stent  . Other surgical history  01/28/12    right percutaneous nephroliathothoma  . Flexible sigmoidoscopy  08/12/2012    Procedure: FLEXIBLE SIGMOIDOSCOPY;  Surgeon: Inda Castle, MD;  Location: [facility];  Service: Endoscopy;  Laterality: N/A;    Social History  reports that she quit smoking about 39 years ago. She has never used smokeless tobacco. She reports that she drinks about 4.2 oz of alcohol per week. She reports that she does not use illicit drugs.  Family History family history includes Heart attack in her sister; Liver cancer in her mother; Peripheral vascular disease in her sister. There is no history of Colon cancer.   Review of Systems  Constitutional: Negative.   Respiratory: Negative.   Cardiovascular: Positive for chest pain.  Gastrointestinal: Negative.   Musculoskeletal: Negative.   Skin: Negative.   Neurological: Negative.   Hematological: Negative.   Psychiatric/Behavioral: Negative.   All other systems reviewed and are negative.   BP 120/72 mmHg  Pulse 66  Ht 5\' 3"  (1.6 m)  Wt 128 lb (58.06 kg)  BMI 22.68 kg/m2  Physical Exam  Constitutional: She is oriented to person, place, and time. She appears well-developed and well-nourished.  HENT:  Head: Normocephalic.  Nose: Nose normal.  Mouth/Throat: Oropharynx is clear and moist.  Eyes: Conjunctivae are normal. Pupils are equal, round, and reactive to light.  Neck: Normal range of motion. Neck supple. No JVD present.  Cardiovascular: Normal rate, regular rhythm, S1 normal, S2 normal and intact distal pulses.  Exam reveals no gallop and no friction rub.   Murmur heard.  Systolic murmur is present with a grade of 1/6  Pulmonary/Chest:  Effort normal and breath sounds normal. No respiratory distress. She has no wheezes. She has no rales. She exhibits no tenderness.  Abdominal: Soft. Bowel sounds are normal. She exhibits no distension. There is no tenderness.  Musculoskeletal: Normal range of motion. She exhibits no edema or tenderness.  Lymphadenopathy:    She has no cervical adenopathy.  Neurological: She is alert and oriented to person, place, and time. Coordination normal.  Skin: Skin is warm and dry. No rash noted. No erythema.  Psychiatric: She has a normal mood and affect. Her behavior is normal. Judgment and thought content normal.    Assessment and Plan  Nursing note and vitals reviewed.

## 2015-02-28 NOTE — Assessment & Plan Note (Signed)
No mention of mitral valve prolapse on her prior echocardiogram. No significant murmur appreciated on exam. No further workup at this time

## 2015-02-28 NOTE — Patient Instructions (Signed)
You are doing well. No medication changes were made.  Please call us if you have new issues that need to be addressed before your next appt.  Your physician wants you to follow-up in: 12 months.  You will receive a reminder letter in the mail two months in advance. If you don't receive a letter, please call our office to schedule the follow-up appointment. 

## 2015-02-28 NOTE — Assessment & Plan Note (Signed)
APCs seem to have resolved on her long-acting propranolol. No medication changes made

## 2015-02-28 NOTE — Assessment & Plan Note (Signed)
Blood pressure is well controlled on today's visit. No changes made to the medications. 

## 2015-02-28 NOTE — Assessment & Plan Note (Signed)
Cholesterol is at goal on the current lipid regimen. No changes to the medications were made.  

## 2015-03-01 DIAGNOSIS — R262 Difficulty in walking, not elsewhere classified: Secondary | ICD-10-CM | POA: Diagnosis not present

## 2015-03-01 DIAGNOSIS — M25579 Pain in unspecified ankle and joints of unspecified foot: Secondary | ICD-10-CM | POA: Diagnosis not present

## 2015-03-04 DIAGNOSIS — M25579 Pain in unspecified ankle and joints of unspecified foot: Secondary | ICD-10-CM | POA: Diagnosis not present

## 2015-03-04 DIAGNOSIS — R262 Difficulty in walking, not elsewhere classified: Secondary | ICD-10-CM | POA: Diagnosis not present

## 2015-03-09 DIAGNOSIS — M25579 Pain in unspecified ankle and joints of unspecified foot: Secondary | ICD-10-CM | POA: Diagnosis not present

## 2015-03-09 DIAGNOSIS — R262 Difficulty in walking, not elsewhere classified: Secondary | ICD-10-CM | POA: Diagnosis not present

## 2015-03-11 DIAGNOSIS — R262 Difficulty in walking, not elsewhere classified: Secondary | ICD-10-CM | POA: Diagnosis not present

## 2015-03-11 DIAGNOSIS — M25579 Pain in unspecified ankle and joints of unspecified foot: Secondary | ICD-10-CM | POA: Diagnosis not present

## 2015-03-14 DIAGNOSIS — R262 Difficulty in walking, not elsewhere classified: Secondary | ICD-10-CM | POA: Diagnosis not present

## 2015-03-14 DIAGNOSIS — M25579 Pain in unspecified ankle and joints of unspecified foot: Secondary | ICD-10-CM | POA: Diagnosis not present

## 2015-03-28 DIAGNOSIS — M2141 Flat foot [pes planus] (acquired), right foot: Secondary | ICD-10-CM | POA: Diagnosis not present

## 2015-03-28 DIAGNOSIS — M2142 Flat foot [pes planus] (acquired), left foot: Secondary | ICD-10-CM | POA: Diagnosis not present

## 2015-03-28 DIAGNOSIS — M6289 Other specified disorders of muscle: Secondary | ICD-10-CM | POA: Diagnosis not present

## 2015-04-03 DIAGNOSIS — M25579 Pain in unspecified ankle and joints of unspecified foot: Secondary | ICD-10-CM | POA: Diagnosis not present

## 2015-04-03 DIAGNOSIS — R262 Difficulty in walking, not elsewhere classified: Secondary | ICD-10-CM | POA: Diagnosis not present

## 2015-04-06 ENCOUNTER — Ambulatory Visit (INDEPENDENT_AMBULATORY_CARE_PROVIDER_SITE_OTHER): Payer: Medicare Other | Admitting: Gastroenterology

## 2015-04-06 ENCOUNTER — Encounter: Payer: Self-pay | Admitting: Gastroenterology

## 2015-04-06 VITALS — BP 120/76 | HR 68 | Ht 62.25 in | Wt 129.0 lb

## 2015-04-06 DIAGNOSIS — R1033 Periumbilical pain: Secondary | ICD-10-CM

## 2015-04-06 DIAGNOSIS — K648 Other hemorrhoids: Secondary | ICD-10-CM

## 2015-04-06 NOTE — Progress Notes (Signed)
      History of Present Illness:  Amy Gilmore reports improvement in her hemorrhoidal symptoms since her last she no longer has prolapse.  She initially had minimal amount of bleeding which entirely subsided.  Altogether she is feeling well.  She has occasional periumbilical discomfort which responds to hyomax.    Review of Systems: Pertinent positive and negative review of systems were noted in the above HPI section. All other review of systems were otherwise negative.    Current Medications, Allergies, Past Medical History, Past Surgical History, Family History and Social History were reviewed in [facility] record  Vital signs were reviewed in today's medical record. Physical Exam: General: Well developed , well nourished, no acute distress   See Assessment and Plan under Problem List

## 2015-04-06 NOTE — Patient Instructions (Signed)
Follow up as needed

## 2015-04-06 NOTE — Assessment & Plan Note (Signed)
Symptoms are improved with repeat band ligation.

## 2015-04-06 NOTE — Assessment & Plan Note (Signed)
Nonspecific pain that responds to hyomax.

## 2015-04-07 DIAGNOSIS — R262 Difficulty in walking, not elsewhere classified: Secondary | ICD-10-CM | POA: Diagnosis not present

## 2015-04-07 DIAGNOSIS — M25579 Pain in unspecified ankle and joints of unspecified foot: Secondary | ICD-10-CM | POA: Diagnosis not present

## 2015-04-19 ENCOUNTER — Other Ambulatory Visit: Payer: Self-pay | Admitting: Internal Medicine

## 2015-04-19 DIAGNOSIS — Z1231 Encounter for screening mammogram for malignant neoplasm of breast: Secondary | ICD-10-CM

## 2015-07-04 ENCOUNTER — Ambulatory Visit
Admission: RE | Admit: 2015-07-04 | Discharge: 2015-07-04 | Disposition: A | Discharge status: Home / Self Care | Payer: Medicare Other | Source: Ambulatory Visit | Attending: Internal Medicine | Admitting: Internal Medicine

## 2015-07-04 ENCOUNTER — Other Ambulatory Visit: Payer: Self-pay | Admitting: Internal Medicine

## 2015-07-04 DIAGNOSIS — Z1231 Encounter for screening mammogram for malignant neoplasm of breast: Secondary | ICD-10-CM

## 2015-07-25 DIAGNOSIS — E785 Hyperlipidemia, unspecified: Secondary | ICD-10-CM | POA: Diagnosis not present

## 2015-07-25 DIAGNOSIS — Z0001 Encounter for general adult medical examination with abnormal findings: Secondary | ICD-10-CM | POA: Diagnosis not present

## 2015-07-25 DIAGNOSIS — Z79899 Other long term (current) drug therapy: Secondary | ICD-10-CM | POA: Diagnosis not present

## 2015-07-25 DIAGNOSIS — I10 Essential (primary) hypertension: Secondary | ICD-10-CM | POA: Diagnosis not present

## 2015-08-02 DIAGNOSIS — Z0001 Encounter for general adult medical examination with abnormal findings: Secondary | ICD-10-CM | POA: Diagnosis not present

## 2015-08-02 DIAGNOSIS — R7301 Impaired fasting glucose: Secondary | ICD-10-CM | POA: Diagnosis not present

## 2015-08-02 DIAGNOSIS — N951 Menopausal and female climacteric states: Secondary | ICD-10-CM | POA: Diagnosis not present

## 2015-08-02 DIAGNOSIS — I10 Essential (primary) hypertension: Secondary | ICD-10-CM | POA: Diagnosis not present

## 2015-08-02 DIAGNOSIS — K219 Gastro-esophageal reflux disease without esophagitis: Secondary | ICD-10-CM | POA: Diagnosis not present

## 2015-08-02 DIAGNOSIS — E785 Hyperlipidemia, unspecified: Secondary | ICD-10-CM | POA: Diagnosis not present

## 2015-08-02 DIAGNOSIS — Z0000 Encounter for general adult medical examination without abnormal findings: Secondary | ICD-10-CM | POA: Diagnosis not present

## 2015-08-02 DIAGNOSIS — E782 Mixed hyperlipidemia: Secondary | ICD-10-CM | POA: Diagnosis not present

## 2015-10-11 DIAGNOSIS — Z23 Encounter for immunization: Secondary | ICD-10-CM | POA: Diagnosis not present

## 2015-11-23 DIAGNOSIS — L570 Actinic keratosis: Secondary | ICD-10-CM | POA: Diagnosis not present

## 2015-11-23 DIAGNOSIS — L820 Inflamed seborrheic keratosis: Secondary | ICD-10-CM | POA: Diagnosis not present

## 2015-11-23 DIAGNOSIS — D1801 Hemangioma of skin and subcutaneous tissue: Secondary | ICD-10-CM | POA: Diagnosis not present

## 2015-11-23 DIAGNOSIS — L821 Other seborrheic keratosis: Secondary | ICD-10-CM | POA: Diagnosis not present

## 2015-11-23 DIAGNOSIS — D485 Neoplasm of uncertain behavior of skin: Secondary | ICD-10-CM | POA: Diagnosis not present

## 2015-12-04 HISTORY — PX: COLONOSCOPY: SHX174

## 2015-12-14 DIAGNOSIS — H2513 Age-related nuclear cataract, bilateral: Secondary | ICD-10-CM | POA: Diagnosis not present

## 2016-03-02 DIAGNOSIS — J309 Allergic rhinitis, unspecified: Secondary | ICD-10-CM | POA: Diagnosis not present

## 2016-03-02 DIAGNOSIS — J209 Acute bronchitis, unspecified: Secondary | ICD-10-CM | POA: Diagnosis not present

## 2016-03-06 ENCOUNTER — Other Ambulatory Visit: Payer: Self-pay | Admitting: Internal Medicine

## 2016-03-06 DIAGNOSIS — Z1231 Encounter for screening mammogram for malignant neoplasm of breast: Secondary | ICD-10-CM

## 2016-05-23 ENCOUNTER — Telehealth: Payer: Self-pay | Admitting: Gastroenterology

## 2016-05-23 NOTE — Telephone Encounter (Signed)
Spoke with patient and she is on a cruise. Suggested she see a ships doctor. She states the ship is only for 75 people and there is no doctor. Suggested she seek medical care in the area if bleeding is bad.

## 2016-06-20 ENCOUNTER — Ambulatory Visit (INDEPENDENT_AMBULATORY_CARE_PROVIDER_SITE_OTHER): Payer: Medicare Other | Admitting: Gastroenterology

## 2016-06-20 ENCOUNTER — Other Ambulatory Visit (INDEPENDENT_AMBULATORY_CARE_PROVIDER_SITE_OTHER): Payer: Medicare Other

## 2016-06-20 ENCOUNTER — Encounter: Payer: Self-pay | Admitting: Gastroenterology

## 2016-06-20 VITALS — BP 160/86 | HR 88 | Ht 63.0 in | Wt 128.0 lb

## 2016-06-20 DIAGNOSIS — R1033 Periumbilical pain: Secondary | ICD-10-CM

## 2016-06-20 DIAGNOSIS — K625 Hemorrhage of anus and rectum: Secondary | ICD-10-CM

## 2016-06-20 DIAGNOSIS — R109 Unspecified abdominal pain: Secondary | ICD-10-CM | POA: Diagnosis not present

## 2016-06-20 LAB — CBC WITH DIFFERENTIAL/PLATELET
BASOS ABS: 0 10*3/uL (ref 0.0–0.1)
BASOS PCT: 0.7 % (ref 0.0–3.0)
EOS ABS: 0.2 10*3/uL (ref 0.0–0.7)
Eosinophils Relative: 2.6 % (ref 0.0–5.0)
HCT: 40.1 % (ref 36.0–46.0)
Hemoglobin: 13.6 g/dL (ref 12.0–15.0)
LYMPHS ABS: 1.6 10*3/uL (ref 0.7–4.0)
Lymphocytes Relative: 27 % (ref 12.0–46.0)
MCHC: 33.8 g/dL (ref 30.0–36.0)
MCV: 97.8 fl (ref 78.0–100.0)
Monocytes Absolute: 0.6 10*3/uL (ref 0.1–1.0)
Monocytes Relative: 9.9 % (ref 3.0–12.0)
NEUTROS ABS: 3.5 10*3/uL (ref 1.4–7.7)
NEUTROS PCT: 59.8 % (ref 43.0–77.0)
PLATELETS: 322 10*3/uL (ref 150.0–400.0)
RBC: 4.1 Mil/uL (ref 3.87–5.11)
RDW: 13.6 % (ref 11.5–15.5)
WBC: 5.9 10*3/uL (ref 4.0–10.5)

## 2016-06-20 MED ORDER — NA SULFATE-K SULFATE-MG SULF 17.5-3.13-1.6 GM/177ML PO SOLN: ORAL | Status: DC

## 2016-06-20 MED ORDER — HYDROCORTISONE ACETATE 25 MG RE SUPP: 25.0000 mg | Freq: Two times a day (BID) | RECTAL | Status: DC | PRN

## 2016-06-20 MED ORDER — HYOSCYAMINE SULFATE 0.125 MG SL SUBL: 0.2500 mg | SUBLINGUAL_TABLET | SUBLINGUAL | Status: DC | PRN

## 2016-06-20 NOTE — Progress Notes (Signed)
HPI :  80 y/o female with history of hemorrhoids here for a follow up visit, former patient of Dr. Deatra Ina.   Patient has had a hemorrhoid banding back in 2013 for rectal bleeding due to hemorrhoids. She was on a cruise recently and noted a few episodes of "significant" rectal bleeding. She did not see blood in the urine. She had bright red blood in the toilet, the first time without stool, which filled the bottom of toilet and she saw and had some "dripping" of blood into the toilet. She denied any perianal or abdominal pain with this. She used some anusol which cleared up her symptoms. She had 2 episodes of significant bleeding, which she saw and then a few milder episodes and it stopped. She thinks the last time she had bleeding was about 2 weeks ago. Prior to this episode she had no rectal bleeding episodes at baseline, prior hemorrhoid banding helped her symptoms but this type of recent bleeding was new for her and different than her hemorrhoidal symptoms. She has a bowel movement once per day, no constipatoin, no straining.   She has some sporadic lower abdominal cramping which comes and goes. Pain is not related to eating or her bowel movements. She has been taking hyocyamine which helps. She is concerned about the possibility of colon cancer. She has no family history of colon cancer, mother had liver cancer.    Prior endoscopic evaluation: Colonoscopy 2004 - diverticulosis Colonoscopy 2013 - 45m adenoma, diverticulosis, internal hemorrhoids Flex sig 2013 - internal hemorrhoid banded  Past Medical History  Diagnosis Date  . Hypertension   . Hyperlipidemia   . Palpitations hx pac's-   occasional  . Mitral valve prolapse per dr wall note 09/12--  stable  . Right shoulder pain Dec 07, 2011 fall    pain at times with positioning  . History of kidney stones   . Vertigo, intermittent   . Frequency of urination   . Urgency of micturation   . Diverticulosis   . Internal hemorrhoids   .  Adenomatous colon polyp 07/03/2012  . PAC (premature atrial contraction) followed by cardiologist --  dr wall--  last visit  sept 12  note in epic--  states stable  . Renal calculus, right followed by dr oKarsten Ro   February 2013     Past Surgical History  Procedure Laterality Date  . Rotator cuff repair  1997 and 2006    right  . Bunionectomy  1995    and osteotomy  . Appendectomy  1955  . Refractive surgery  yrs ago  . Nephrolithotomy  01/28/2012-  right ureteral stent placement    Procedure: NEPHROLITHOTOMY PERCUTANEOUS;  Surgeon: MClaybon Jabs MD;  Location: [facility];  Service: Urology;  Laterality: Right;  . Vaginal hysterectomy  1985  . Ureteroscopy  02/22/2012    Procedure: URETEROSCOPY;  Surgeon: MClaybon Jabs MD;  [facility]  Service: Urology;  Laterality: Right;  URETEROSCOPY WITH STONE EXTRACTION   . Cystoscopy  02/22/2012    Procedure: CYSTOSCOPY;  Surgeon: MClaybon Jabs MD;  [facility]  Service: Urology;  Laterality: Right;  removal of ureteral stent  . Other surgical history  01/28/12    right percutaneous nephroliathothoma  . Flexible sigmoidoscopy  08/12/2012    Procedure: FLEXIBLE SIGMOIDOSCOPY;  Surgeon: RInda Castle MD;  Location: [facility];  Service: Endoscopy;  Laterality: N/A;   Family History  Problem Relation Age of Onset  .  Liver cancer Mother   . Heart attack Sister   . Peripheral vascular disease Sister   . Colon cancer Neg Hx    Social History  Substance Use Topics  . Smoking status: Former Smoker -- 1.00 packs/day for 15 years    Quit date: 12/04/1975  . Smokeless tobacco: Never Used  . Alcohol Use: 4.2 oz/week    7 Glasses of wine per week     Comment: Socially   Current Outpatient Prescriptions  Medication Sig Dispense Refill  . acetaminophen (TYLENOL) 500 MG tablet Take 1,000 mg by mouth every 6 (six) hours as needed. Pain     . aspirin 81 MG tablet Take 81 mg by mouth daily.    Marland Kitchen  atorvastatin (LIPITOR) 10 MG tablet Take 10 mg by mouth at bedtime.     Marland Kitchen diltiazem (CARDIZEM) 30 MG tablet Take 30-60 mg by mouth as needed. Take 1-2 tablets (30-25m) for increasing palpitations.    . Glucosamine 500 MG CAPS Take 500 mg by mouth daily.     . hyoscyamine (LEVSIN SL) 0.125 MG SL tablet Place 2 tablets (0.25 mg total) under the tongue every 4 (four) hours as needed. 30 tablet 0  . Hypromellose (GENTEAL) 0.3 % SOLN Place 1-2 drops into both eyes 2 (two) times daily as needed. Dry eyes     . meclizine (ANTIVERT) 25 MG tablet Take 25 mg by mouth as needed. Inner ear    . meloxicam (MOBIC) 7.5 MG tablet Take 7.5 mg by mouth as needed.    . Multiple Vitamins-Minerals (CENTRUM SILVER) tablet Take 1 tablet by mouth daily.     . propranolol (INDERAL LA) 60 MG 24 hr capsule Take 60 mg by mouth daily before breakfast.     . valsartan-hydrochlorothiazide (DIOVAN-HCT) 160-12.5 MG per tablet Take 1 tablet by mouth daily.    . hydrocortisone (ANUSOL-HC) 25 MG suppository Place 1 suppository (25 mg total) rectally 2 (two) times daily as needed for hemorrhoids or itching. 12 suppository 0  . Na Sulfate-K Sulfate-Mg Sulf (SUPREP BOWEL PREP KIT) 17.5-3.13-1.6 GM/180ML SOLN Use as directed 2 Bottle 0   No current facility-administered medications for this visit.   Allergies  Allergen Reactions  . Codeine Nausea Only  . Sulfa Antibiotics Rash     Review of Systems: All systems reviewed and negative except where noted in HPI.   Lab Results  Component Value Date   WBC 6.9 01/23/2012   HGB 11.2* 02/22/2012   HCT 33.0* 02/22/2012   MCV 96.5 01/23/2012   PLT 262 01/23/2012    Physical Exam: BP 160/86 mmHg  Pulse 88  Ht 5' 3" (1.6 m)  Wt 128 lb (58.06 kg)  BMI 22.68 kg/m2 Constitutional: Pleasant,well-developed, female in no acute distress. HEENT: Normocephalic and atraumatic. Conjunctivae are normal. No scleral icterus. Neck supple.  Cardiovascular: Normal rate, regular rhythm.    Pulmonary/chest: Effort normal and breath sounds normal. No wheezing, rales or rhonchi. Abdominal: Soft, nondistended, nontender. Bowel sounds active throughout. There are no masses palpable.  Extremities: no edema Lymphadenopathy: No cervical adenopathy noted. Neurological: Alert and oriented to person place and time. Skin: Skin is warm and dry. No rashes noted. Psychiatric: Normal mood and affect. Behavior is normal.   ASSESSMENT AND PLAN: 80y/o female presenting with a few episodes of that the patient described as fairly large volume blood per rectum which resolved on its own. She has a history of hemorrhoidal bleeding, but this type of bleeding was different than her historic  symptoms. She is concerned about the possibility of colon cancer which I think is unlikely and reassured her given last colonoscopy was 4 years ago. This more likely could have been hemorrhoidal bleeding or diverticular in nature. She had an adenoma removed in 2013 and given her good health otherwise was planning on another colonoscopy in 2018 for surveillance purposes. With these episodes of bleeding she wanted to proceed with another colonoscopy to ensure no bleeding polyp / mass lesion and for piece of mind. I offered her the colonoscopy for these reasons and after discussion of risks / benefits she wanted to proceed. If the colonoscopy does not show any polyps, I don't think she will need another exam in the future for screening purposes. Otherwise, she could be having spasm causing her intermittent pain given she responds reliably to hyocyamine. Will await colonoscopy, if symptoms persist may consider imaging in the future. I will otherwise check CBC to ensure stable and no anemia. She agreed.   [place] Cellar, MD [facility] Pager [phone]

## 2016-06-20 NOTE — Patient Instructions (Addendum)
  You have been scheduled for a colonoscopy. Please follow written instructions given to you at your visit today.  Please pick up your prep supplies at the pharmacy within the next 1-3 days. If you use inhalers (even only as needed), please bring them with you on the day of your procedure.   Your physician has requested that you go to the basement for the following lab work before leaving today: CBC/diff  We have sent the following medications to your pharmacy for you to pick up at your convenience: Generic Levsin, Anusol supp.   I appreciate the opportunity to care for you.

## 2016-07-04 ENCOUNTER — Ambulatory Visit
Admission: RE | Admit: 2016-07-04 | Discharge: 2016-07-04 | Disposition: A | Discharge status: Home / Self Care | Payer: Medicare Other | Source: Ambulatory Visit | Attending: Internal Medicine | Admitting: Internal Medicine

## 2016-07-04 ENCOUNTER — Other Ambulatory Visit: Payer: Self-pay | Admitting: Internal Medicine

## 2016-07-04 DIAGNOSIS — Z1231 Encounter for screening mammogram for malignant neoplasm of breast: Secondary | ICD-10-CM

## 2016-07-16 ENCOUNTER — Telehealth: Payer: Self-pay | Admitting: Gastroenterology

## 2016-07-16 NOTE — Telephone Encounter (Signed)
Patient expresses concerns about her glucose readings. She is concerned pre-diabetic. She is not on any medication, but is careful with her diet. She normally have minimally elevated readings, but has noted it trending upwards to as high as 130. She has contacted her PCP. Her appointment is after the date scheduled for her colonoscopy. Discussed the limit for hyperglycemia is at or above 350. She will continued as planned and be very mindful of her diet and exercise.

## 2016-07-17 ENCOUNTER — Telehealth: Payer: Self-pay | Admitting: Gastroenterology

## 2016-07-17 NOTE — Telephone Encounter (Signed)
Left message for patient to call back  

## 2016-07-18 NOTE — Telephone Encounter (Signed)
Explained to patient that unfortunately can't do a banding at the time of the procedure.  She is advised to talk with Dr. Havery Moros at the time of the procedure to set up a banding at a later date of appropriate.

## 2016-07-25 ENCOUNTER — Encounter: Payer: Self-pay | Admitting: Gastroenterology

## 2016-07-25 ENCOUNTER — Ambulatory Visit (AMBULATORY_SURGERY_CENTER): Payer: Medicare Other | Admitting: Gastroenterology

## 2016-07-25 VITALS — BP 123/69 | HR 51 | Temp 98.0°F | Resp 14 | Ht 63.0 in | Wt 128.0 lb

## 2016-07-25 DIAGNOSIS — K625 Hemorrhage of anus and rectum: Secondary | ICD-10-CM

## 2016-07-25 DIAGNOSIS — D120 Benign neoplasm of cecum: Secondary | ICD-10-CM

## 2016-07-25 MED ORDER — SODIUM CHLORIDE 0.9 % IV SOLN: 500.0000 mL | INTRAVENOUS | Status: DC

## 2016-07-25 NOTE — Patient Instructions (Signed)
Impression/recommendations:  Polyps (handout given) Hemorrhoids (handout given as well as banding handout)  Diverticulosis (handout given) High Fiber diet (handout given)  YOU HAD AN ENDOSCOPIC PROCEDURE TODAY AT [facility]:   Refer to the procedure report that was given to you for any specific questions about what was found during the examination.  If the procedure report does not answer your questions, please call your gastroenterologist to clarify.  If you requested that your care partner not be given the details of your procedure findings, then the procedure report has been included in a sealed envelope for you to review at your convenience later.  YOU SHOULD EXPECT: Some feelings of bloating in the abdomen. Passage of more gas than usual.  Walking can help get rid of the air that was put into your GI tract during the procedure and reduce the bloating. If you had a lower endoscopy (such as a colonoscopy or flexible sigmoidoscopy) you may notice spotting of blood in your stool or on the toilet paper. If you underwent a bowel prep for your procedure, you may not have a normal bowel movement for a few days.  Please Note:  You might notice some irritation and congestion in your nose or some drainage.  This is from the oxygen used during your procedure.  There is no need for concern and it should clear up in a day or so.  SYMPTOMS TO REPORT IMMEDIATELY:   Following lower endoscopy (colonoscopy or flexible sigmoidoscopy):  Excessive amounts of blood in the stool  Significant tenderness or worsening of abdominal pains  Swelling of the abdomen that is new, acute  Fever of 100F or higher   For urgent or emergent issues, a gastroenterologist can be reached at any hour by calling [phone].   DIET:  We do recommend a small meal at first, but then you may proceed to your regular diet.  Drink plenty of fluids but you should avoid alcoholic beverages for 24  hours.  ACTIVITY:  You should plan to take it easy for the rest of today and you should NOT DRIVE or use heavy machinery until tomorrow (because of the sedation medicines used during the test).    FOLLOW UP: Our staff will call the number listed on your records the next business day following your procedure to check on you and address any questions or concerns that you may have regarding the information given to you following your procedure. If we do not reach you, we will leave a message.  However, if you are feeling well and you are not experiencing any problems, there is no need to return our call.  We will assume that you have returned to your regular daily activities without incident.  If any biopsies were taken you will be contacted by phone or by letter within the next 1-3 weeks.  Please call us at [phone] if you have not heard about the biopsies in 3 weeks.    SIGNATURES/CONFIDENTIALITY: You and/or your care partner have signed paperwork which will be entered into your electronic medical record.  These signatures attest to the fact that that the information above on your After Visit Summary has been reviewed and is understood.  Full responsibility of the confidentiality of this discharge information lies with you and/or your care-partner.

## 2016-07-25 NOTE — Progress Notes (Signed)
Called to room to assist during endoscopic procedure.  Patient ID and intended procedure confirmed with present staff. Received instructions for my participation in the procedure from the performing physician.  

## 2016-07-25 NOTE — Progress Notes (Signed)
A/ox3 pleased with MAC, report to Tracy RN 

## 2016-07-25 NOTE — Op Note (Signed)
[place] Patient Name: Amy Gilmore Procedure Date: 07/25/2016 10:18 AM MRN: [id number] Endoscopist: Remo Lipps P. Havery Moros , MD Age: 80 Referring MD:  Date of Birth: 02/12/1936 Gender: Female Account #: [id number] Procedure:                Colonoscopy Indications:              Evaluation of unexplained GI bleeding Medicines:                Monitored Anesthesia Care Procedure:                Pre-Anesthesia Assessment:                           - Prior to the procedure, a History and Physical                            was performed, and patient medications and                            allergies were reviewed. The patient's tolerance of                            previous anesthesia was also reviewed. The risks                            and benefits of the procedure and the sedation                            options and risks were discussed with the patient.                            All questions were answered, and informed consent                            was obtained. Prior Anticoagulants: The patient has                            taken aspirin, last dose was 1 day prior to                            procedure. ASA Grade Assessment: II - A patient                            with mild systemic disease. After reviewing the                            risks and benefits, the patient was deemed in                            satisfactory condition to undergo the procedure.                           After obtaining informed consent, the colonoscope  was passed under direct vision. Throughout the                            procedure, the patient's blood pressure, pulse, and                            oxygen saturations were monitored continuously. The                            [id number]) was introduced through the anus                            and advanced to the the cecum, identified by                            appendiceal  orifice and ileocecal valve. The                            colonoscopy was performed without difficulty. The                            patient tolerated the procedure well. The quality                            of the bowel preparation was good. The ileocecal                            valve, appendiceal orifice, and rectum were                            photographed. Scope In: 10:27:08 AM Scope Out: 10:45:24 AM Scope Withdrawal Time: 0 hours 14 minutes 40 seconds  Total Procedure Duration: 0 hours 18 minutes 16 seconds  Findings:                 The perianal exam findings include non-thrombosed                            external hemorrhoids.                           A diminutive polyp was found in the cecum. The                            polyp was sessile. The polyp was removed with a                            cold biopsy forceps. Resection and retrieval were                            complete.                           A 6 mm polyp was found in the cecum. The polyp was  sessile. The polyp was removed with a cold snare.                            Resection and retrieval were complete.                           Multiple medium-mouthed diverticula were found in                            the sigmoid colon.                           Non-bleeding inflamed internal hemorrhoids were                            found during retroflexion.                           The exam was otherwise without abnormality. Complications:            No immediate complications. Estimated blood loss:                            Minimal. Estimated Blood Loss:     Estimated blood loss was minimal. Impression:               - Non-thrombosed external hemorrhoids found on                            perianal exam.                           - One diminutive polyp in the cecum, removed with a                            cold biopsy forceps. Resected and retrieved.                            - One 6 mm polyp in the cecum, removed with a cold                            snare. Resected and retrieved.                           - Diverticulosis in the sigmoid colon.                           - Non-bleeding internal hemorrhoids.                           - The examination was otherwise normal.                           Overall, suspect hemorrhoids are the most likely                            cause of the  patient's symptoms. Recommendation:           - Patient has a contact number available for                            emergencies. The signs and symptoms of potential                            delayed complications were discussed with the                            patient. Return to normal activities tomorrow.                            Written discharge instructions were provided to the                            patient.                           - Resume previous diet.                           - Continue present medications.                           - No ibuprofen, naproxen, or other non-steroidal                            anti-inflammatory drugs for 2 weeks after polyp                            removal.                           - Daily fiber supplement if not already taking                           - Await pathology results.                           - Recommend hemorrhoid banding given ongoing                            symptoms and large hemorrhoids. We will schedule                            this as soon as possible. Remo Lipps P. Armbruster, MD 07/25/2016 10:51:11 AM This report has been signed electronically.

## 2016-07-26 ENCOUNTER — Telehealth: Payer: Self-pay

## 2016-07-26 NOTE — Telephone Encounter (Signed)
  Follow up Call-  Call back number 07/25/2016  Post procedure Call Back phone  # [phone]  Permission to leave phone message Yes  Some recent data might be hidden    Patient was called for follow up after his procedure on 07/25/2016. No answer at the number given for follow up phone call. A message was left on the answering machine.

## 2016-07-30 ENCOUNTER — Other Ambulatory Visit: Payer: Self-pay

## 2016-07-30 ENCOUNTER — Encounter: Payer: Self-pay | Admitting: Gastroenterology

## 2016-07-31 DIAGNOSIS — Z0001 Encounter for general adult medical examination with abnormal findings: Secondary | ICD-10-CM | POA: Diagnosis not present

## 2016-07-31 DIAGNOSIS — I341 Nonrheumatic mitral (valve) prolapse: Secondary | ICD-10-CM | POA: Diagnosis not present

## 2016-07-31 DIAGNOSIS — I10 Essential (primary) hypertension: Secondary | ICD-10-CM | POA: Diagnosis not present

## 2016-08-07 DIAGNOSIS — Z0001 Encounter for general adult medical examination with abnormal findings: Secondary | ICD-10-CM | POA: Diagnosis not present

## 2016-08-07 DIAGNOSIS — E2839 Other primary ovarian failure: Secondary | ICD-10-CM | POA: Diagnosis not present

## 2016-08-07 DIAGNOSIS — E782 Mixed hyperlipidemia: Secondary | ICD-10-CM | POA: Diagnosis not present

## 2016-08-07 DIAGNOSIS — I341 Nonrheumatic mitral (valve) prolapse: Secondary | ICD-10-CM | POA: Diagnosis not present

## 2016-08-07 DIAGNOSIS — R7301 Impaired fasting glucose: Secondary | ICD-10-CM | POA: Diagnosis not present

## 2016-08-07 DIAGNOSIS — I10 Essential (primary) hypertension: Secondary | ICD-10-CM | POA: Diagnosis not present

## 2016-08-21 ENCOUNTER — Encounter: Payer: Self-pay | Admitting: Gastroenterology

## 2016-08-21 ENCOUNTER — Ambulatory Visit (INDEPENDENT_AMBULATORY_CARE_PROVIDER_SITE_OTHER): Payer: Medicare Other | Admitting: Gastroenterology

## 2016-08-21 VITALS — BP 130/70 | HR 68 | Ht 63.0 in | Wt 127.0 lb

## 2016-08-21 DIAGNOSIS — K642 Third degree hemorrhoids: Secondary | ICD-10-CM | POA: Diagnosis not present

## 2016-08-21 NOTE — Progress Notes (Signed)
PROCEDURE NOTE: The patient presents with symptomatic grade III  hemorrhoids, requesting rubber band ligation of his/her hemorrhoidal disease.  All risks, benefits and alternative forms of therapy were described and informed consent was obtained.  In the Left Lateral Decubitus position anoscopic examination revealed grade III hemorrhoids in all position(s), largest in RA position. The anorectum was pre-medicated with 0.125% nitroglycerin The decision was made to band the RA internal hemorrhoid, and the [place] was used to perform band ligation without complication.  Digital anorectal examination was then performed to assure proper positioning of the band, and to adjust the banded tissue as required.  The patient was discharged home without pain or other issues.  Dietary and behavioral recommendations were given and along with follow-up instructions.     The following adjunctive treatments were recommended: Daily fiber supplement  The patient will return in 3 weeks for  follow-up and possible additional banding as required. No complications were encountered and the patient tolerated the procedure well.  Star[place] Cellar, MD [facility] Pager [phone]

## 2016-08-21 NOTE — Patient Instructions (Addendum)
HEMORRHOID BANDING PROCEDURE    FOLLOW-UP CARE   1. The procedure you have had should have been relatively painless since the banding of the area involved does not have nerve endings and there is no pain sensation.  The rubber band cuts off the blood supply to the hemorrhoid and the band may fall off as soon as 48 hours after the banding (the band may occasionally be seen in the toilet bowl following a bowel movement). You may notice a temporary feeling of fullness in the rectum which should respond adequately to plain Tylenol or Motrin.  2. Following the banding, avoid strenuous exercise that evening and resume full activity the next day.  A sitz bath (soaking in a warm tub) or bidet is soothing, and can be useful for cleansing the area after bowel movements.     3. To avoid constipation, take two tablespoons of natural wheat bran, natural oat bran, flax, Benefiber or any over the counter fiber supplement and increase your water intake to 7-8 glasses daily.    4. Unless you have been prescribed anorectal medication, do not put anything inside your rectum for two weeks: No suppositories, enemas, fingers, etc.  5. Occasionally, you may have more bleeding than usual after the banding procedure.  This is often from the untreated hemorrhoids rather than the treated one.  Don't be concerned if there is a tablespoon or so of blood.  If there is more blood than this, lie flat with your bottom higher than your head and apply an ice pack to the area. If the bleeding does not stop within a half an hour or if you feel faint, call our office at [phone] or go to the emergency room.  6. Problems are not common; however, if there is a substantial amount of bleeding, severe pain, chills, fever or difficulty passing urine (very rare) or other problems, you should call us at [phone] or report to the nearest emergency room.  7. Do not stay seated continuously for more than 2-3 hours for a day or two  after the procedure.  Tighten your buttock muscles 10-15 times every two hours and take 10-15 deep breaths every 1-2 hours.  Do not spend more than a few minutes on the toilet if you cannot empty your bowel; instead re-visit the toilet at a later time.   Start daily fiber supplement.

## 2016-09-11 ENCOUNTER — Encounter: Payer: Self-pay | Admitting: Gastroenterology

## 2016-09-11 ENCOUNTER — Ambulatory Visit (INDEPENDENT_AMBULATORY_CARE_PROVIDER_SITE_OTHER): Payer: Medicare Other | Admitting: Gastroenterology

## 2016-09-11 VITALS — BP 124/80 | HR 60 | Ht 62.25 in | Wt 127.4 lb

## 2016-09-11 DIAGNOSIS — K649 Unspecified hemorrhoids: Secondary | ICD-10-CM

## 2016-09-11 NOTE — Patient Instructions (Addendum)
If you are age 78 or older, your body mass index should be between 23-30. Your Body mass index is 23.11 kg/m. If this is out of the aforementioned range listed, please consider follow up with your Primary Care Provider.  If you are age 78 or younger, your body mass index should be between 19-25. Your Body mass index is 23.11 kg/m. If this is out of the aformentioned range listed, please consider follow up with your Primary Care Provider.   Your next appointment with Dr Havery Moros is scheduled for 10/19/16 at 4:00pm.

## 2016-09-11 NOTE — Progress Notes (Signed)
PROCEDURE NOTE: The patient presents with symptomatic grade III  hemorrhoids, requesting rubber band ligation of his/her hemorrhoidal disease.  All risks, benefits and alternative forms of therapy were described and informed consent was obtained.   The anorectum was pre-medicated with 0.125% nitroglycerin The decision was made to band the RP internal hemorrhoid, and the [place] was used to perform band ligation without complication.  Digital anorectal examination was then performed to assure proper positioning of the band, and to adjust the banded tissue as required.  The patient was discharged home without pain or other issues.  Dietary and behavioral recommendations were given and along with follow-up instructions.     The following adjunctive treatments were recommended: Daily fiber supplement   The patient will return in 2-4 weeks for follow-up and possible additional banding as required. No complications were encountered and the patient tolerated the procedure well.  Norton Cellar, MD [facility] Pager [phone]

## 2016-09-19 DIAGNOSIS — E2839 Other primary ovarian failure: Secondary | ICD-10-CM | POA: Diagnosis not present

## 2016-09-19 DIAGNOSIS — M810 Age-related osteoporosis without current pathological fracture: Secondary | ICD-10-CM | POA: Diagnosis not present

## 2016-09-20 DIAGNOSIS — Z23 Encounter for immunization: Secondary | ICD-10-CM | POA: Diagnosis not present

## 2016-10-19 ENCOUNTER — Ambulatory Visit (INDEPENDENT_AMBULATORY_CARE_PROVIDER_SITE_OTHER): Payer: Medicare Other | Admitting: Gastroenterology

## 2016-10-19 ENCOUNTER — Encounter: Payer: Self-pay | Admitting: Gastroenterology

## 2016-10-19 VITALS — BP 110/76 | HR 57 | Ht 63.0 in | Wt 130.0 lb

## 2016-10-19 DIAGNOSIS — K648 Other hemorrhoids: Secondary | ICD-10-CM

## 2016-10-19 NOTE — Progress Notes (Signed)
PROCEDURE NOTE: The patient presents with symptomatic grade III  hemorrhoids, requesting rubber band ligation of his/her hemorrhoidal disease.  All risks, benefits and alternative forms of therapy were described and informed consent was obtained.   The anorectum was pre-medicated with 0.125% nitroglycerin ointment The decision was made to band the LL internal hemorrhoid, and the [place] was used to perform band ligation without complication.  Digital anorectal examination was then performed to assure proper positioning of the band, and to adjust the banded tissue as required.  The patient was discharged home without pain or other issues.  Dietary and behavioral recommendations were given and along with follow-up instructions.     The following adjunctive treatments were recommended: Fiber supplement as needed  The patient will return as needed moving forward, she has completed all bandings with good result thus far. No complications were encountered and the patient tolerated the procedure well.   Richland Cellar, MD [facility] Pager [phone]

## 2016-10-19 NOTE — Patient Instructions (Signed)
HEMORRHOID BANDING PROCEDURE    FOLLOW-UP CARE   1. The procedure you have had should have been relatively painless since the banding of the area involved does not have nerve endings and there is no pain sensation.  The rubber band cuts off the blood supply to the hemorrhoid and the band may fall off as soon as 48 hours after the banding (the band may occasionally be seen in the toilet bowl following a bowel movement). You may notice a temporary feeling of fullness in the rectum which should respond adequately to plain Tylenol or Motrin.  2. Following the banding, avoid strenuous exercise that evening and resume full activity the next day.  A sitz bath (soaking in a warm tub) or bidet is soothing, and can be useful for cleansing the area after bowel movements.     3. To avoid constipation, take two tablespoons of natural wheat bran, natural oat bran, flax, Benefiber or any over the counter fiber supplement and increase your water intake to 7-8 glasses daily.    4. Unless you have been prescribed anorectal medication, do not put anything inside your rectum for two weeks: No suppositories, enemas, fingers, etc.  5. Occasionally, you may have more bleeding than usual after the banding procedure.  This is often from the untreated hemorrhoids rather than the treated one.  Don't be concerned if there is a tablespoon or so of blood.  If there is more blood than this, lie flat with your bottom higher than your head and apply an ice pack to the area. If the bleeding does not stop within a half an hour or if you feel faint, call our office at [phone] or go to the emergency room.  6. Problems are not common; however, if there is a substantial amount of bleeding, severe pain, chills, fever or difficulty passing urine (very rare) or other problems, you should call us at [phone] or report to the nearest emergency room.  7. Do not stay seated continuously for more than 2-3 hours for a day or two  after the procedure.  Tighten your buttock muscles 10-15 times every two hours and take 10-15 deep breaths every 1-2 hours.  Do not spend more than a few minutes on the toilet if you cannot empty your bowel; instead re-visit the toilet at a later time.    If you are age 53 or older, your body mass index should be between 23-30. Your Body mass index is 23.03 kg/m. If this is out of the aforementioned range listed, please consider follow up with your Primary Care Provider.  If you are age 58 or younger, your body mass index should be between 19-25. Your Body mass index is 23.03 kg/m. If this is out of the aformentioned range listed, please consider follow up with your Primary Care Provider.

## 2016-12-05 ENCOUNTER — Ambulatory Visit: Payer: Medicare Other | Admitting: Cardiovascular Disease

## 2016-12-06 ENCOUNTER — Ambulatory Visit (INDEPENDENT_AMBULATORY_CARE_PROVIDER_SITE_OTHER): Payer: Medicare Other | Admitting: Cardiovascular Disease

## 2016-12-06 ENCOUNTER — Encounter: Payer: Self-pay | Admitting: Cardiovascular Disease

## 2016-12-06 VITALS — BP 124/78 | HR 60 | Ht 63.0 in | Wt 130.0 lb

## 2016-12-06 DIAGNOSIS — I340 Nonrheumatic mitral (valve) insufficiency: Secondary | ICD-10-CM | POA: Diagnosis not present

## 2016-12-06 DIAGNOSIS — I10 Essential (primary) hypertension: Secondary | ICD-10-CM

## 2016-12-06 DIAGNOSIS — R002 Palpitations: Secondary | ICD-10-CM | POA: Diagnosis not present

## 2016-12-06 DIAGNOSIS — E782 Mixed hyperlipidemia: Secondary | ICD-10-CM | POA: Diagnosis not present

## 2016-12-06 NOTE — Patient Instructions (Signed)
Ask Dr. Humphrey Rolls about the potassium level Normal range 3.5 up to 5.5   Medication Instructions:   No medication changes made  Labwork:  No new labs needed  Testing/Procedures:  No further testing at this time   I recommend watching educational videos on topics of interest to you at:       [email or web address]  Enter code: [email or web address]    Follow-Up: It was a pleasure seeing you in the office today. Please call us if you have new issues that need to be addressed before your next appt.  [phone]  Your physician wants you to follow-up in: 12 months.  You will receive a reminder letter in the mail two months in advance. If you don't receive a letter, please call our office to schedule the follow-up appointment.  If you need a refill on your cardiac medications before your next appointment, please call your pharmacy.

## 2016-12-06 NOTE — Progress Notes (Signed)
Cardiology Office Note  Date:  12/06/2016   ID:  Amy Gilmore, DOB 1936/03/15, MRN [id number]  PCP:  Lavera Guise, MD   Chief Complaint  Patient presents with  . other     Overdue fu. Pt states she is doing well. Reviewed meds with pt verbally.    HPI:  Amy Gilmore is a very pleasant 81 year old woman with notes indicating a history of mitral valve prolapse, history of palpitations and PACs, hypertension and hyperlipidemia, Last echocardiogram in 2011 did not mention mitral valve prolapse but did suggest mild, possible mild to moderate MR, normal ejection fraction. Prior smoking history, stopped 30 years ago She presents today for routine follow-up of her mitral valve regurgitation and APCs  In general she reports that she has been feeling well She did have some trouble with indigestion Bleeding of internal hemorrhoids, was seen by GI had colonoscopy with banding of her hemorrhoids  Denies having any significant tachycardia, palpitations Has not needed to take any diltiazem Active, no shortness of breath, no leg edema, no PND or orthopnea No regular exercise program  EKG on today's visit shows normal sinus rhythm with rate 60 bpm, no significant ST or T-wave changes  Lab work from primary care showing total cholesterol 159, LDL 80   PMH:   has a past medical history of Adenomatous colon polyp (07/03/2012); Diverticulosis; Frequency of urination; History of kidney stones; Hyperlipidemia; Hypertension; Internal hemorrhoids; Mitral valve prolapse (per dr wall note 09/12--  stable); PAC (premature atrial contraction) (followed by cardiologist --  dr wall--  last visit  sept 12  note in epic--  states stable); Palpitations (hx pac's-   occasional); Renal calculus, right (followed by dr Karsten Ro); Right shoulder pain (Dec 07, 2011 fall); Urgency of micturation; and Vertigo, intermittent.  PSH:    Past Surgical History:  Procedure Laterality Date  . APPENDECTOMY  1955  .  BUNIONECTOMY  1995   and osteotomy  . COLONOSCOPY  2017  . CYSTOSCOPY  02/22/2012   Procedure: CYSTOSCOPY;  Surgeon: Claybon Jabs, MD;  Location: [facility];  Service: Urology;  Laterality: Right;  removal of ureteral stent  . FLEXIBLE SIGMOIDOSCOPY  08/12/2012   Procedure: FLEXIBLE SIGMOIDOSCOPY;  Surgeon: Inda Castle, MD;  Location: [facility];  Service: Endoscopy;  Laterality: N/A;  . NEPHROLITHOTOMY  01/28/2012-  right ureteral stent placement   Procedure: NEPHROLITHOTOMY PERCUTANEOUS;  Surgeon: Claybon Jabs, MD;  Location: [facility];  Service: Urology;  Laterality: Right;  . OTHER SURGICAL HISTORY  01/28/12   right percutaneous nephroliathothoma  . REFRACTIVE SURGERY  yrs ago  . [place] and 2006   right  . URETEROSCOPY  02/22/2012   Procedure: URETEROSCOPY;  Surgeon: Claybon Jabs, MD;  Location: [facility];  Service: Urology;  Laterality: Right;  URETEROSCOPY WITH STONE EXTRACTION   . VAGINAL HYSTERECTOMY  1985    Current Outpatient Prescriptions  Medication Sig Dispense Refill  . acetaminophen (TYLENOL) 500 MG tablet Take 1,000 mg by mouth every 6 (six) hours as needed. Pain     . aspirin 81 MG tablet Take 81 mg by mouth daily.    Marland Kitchen atorvastatin (LIPITOR) 10 MG tablet Take 10 mg by mouth at bedtime.     . Glucosamine 500 MG CAPS Take 500 mg by mouth daily.     . hydrocortisone (ANUSOL-HC) 25 MG suppository Place 1 suppository (25 mg total) rectally 2 (two) times daily as needed for hemorrhoids or  itching. 12 suppository 0  . hyoscyamine (LEVSIN SL) 0.125 MG SL tablet Place 2 tablets (0.25 mg total) under the tongue every 4 (four) hours as needed. 30 tablet 0  . Hypromellose (GENTEAL) 0.3 % SOLN Place 1-2 drops into both eyes 2 (two) times daily as needed. Dry eyes     . meclizine (ANTIVERT) 25 MG tablet Take 25 mg by mouth as needed. Inner ear    . meloxicam (MOBIC) 7.5 MG tablet Take 7.5 mg by mouth as needed.    . Multiple  Vitamins-Minerals (CENTRUM SILVER) tablet Take 1 tablet by mouth daily.     . propranolol (INDERAL LA) 60 MG 24 hr capsule Take 60 mg by mouth daily before breakfast.     . valsartan-hydrochlorothiazide (DIOVAN-HCT) 160-12.5 MG per tablet Take 1 tablet by mouth daily.     Current Facility-Administered Medications  Medication Dose Route Frequency Provider Last Rate Last Dose  . 0.9 %  sodium chloride infusion  500 mL Intravenous Continuous Manus Gunning, MD         Allergies:   Codeine and Sulfa antibiotics   Social History:  The patient  reports that she quit smoking about 41 years ago. She has a 15.00 pack-year smoking history. She has never used smokeless tobacco. She reports that she drinks about 4.2 oz of alcohol per week . She reports that she does not use drugs.   Family History:   family history includes Heart attack in her sister; Liver cancer in her mother; Peripheral vascular disease in her sister.    Review of Systems: Review of Systems  Constitutional: Negative.   Respiratory: Negative.   Cardiovascular: Negative.   Gastrointestinal: Negative.        Indigestion  Musculoskeletal: Negative.   Neurological: Negative.   Psychiatric/Behavioral: Negative.   All other systems reviewed and are negative.    PHYSICAL EXAM: VS:  BP 124/78 (BP Location: Left Arm, Patient Position: Sitting, Cuff Size: Normal)   Pulse 60   Ht 5\' 3"  (1.6 m)   Wt 130 lb (59 kg)   BMI 23.03 kg/m  , BMI Body mass index is 23.03 kg/m. GEN: Well nourished, well developed, in no acute distress  HEENT: normal  Neck: no JVD, carotid bruits, or masses Cardiac: RRR; no murmurs, rubs, or gallops,no edema  Respiratory:  clear to auscultation bilaterally, normal work of breathing GI: soft, nontender, nondistended, + BS MS: no deformity or atrophy  Skin: warm and dry, no rash Neuro:  Strength and sensation are intact Psych: euthymic mood, full affect    Recent Labs: 06/20/2016: Hemoglobin  13.6; Platelets 322.0    Lipid Panel No results found for: CHOL, HDL, LDLCALC, TRIG    Wt Readings from Last 3 Encounters:  12/06/16 130 lb (59 kg)  10/19/16 130 lb (59 kg)  09/11/16 127 lb 6 oz (57.8 kg)       ASSESSMENT AND PLAN:  Essential hypertension - Plan: EKG 12-Lead Blood pressure is well controlled on today's visit. No changes made to the medications.  Mixed hyperlipidemia Followed by primary care Prior lipid panel several years ago excellent range We'll try to obtain lab work from primary care for our records  Mitral valve insufficiency, unspecified etiology No significant murmur appreciated on exam No further workup needed at this time Prior echocardiogram with mild MR  Palpitations Denies having any palpitations. No further workup at this time Has not needed diltiazem   Total encounter time more than 15 minutes  Greater than  50% was spent in counseling and coordination of care with the patient   Disposition:   F/U  12 months   Orders Placed This Encounter  Procedures  . EKG 12-Lead     Signed, Esmond Plants, M.D., Ph.D. 12/06/2016  [place], [place]

## 2016-12-18 DIAGNOSIS — H2513 Age-related nuclear cataract, bilateral: Secondary | ICD-10-CM | POA: Diagnosis not present

## 2017-02-05 ENCOUNTER — Ambulatory Visit
Admission: RE | Admit: 2017-02-05 | Discharge: 2017-02-05 | Disposition: A | Discharge status: Home / Self Care | Payer: Medicare Other | Source: Ambulatory Visit | Attending: Internal Medicine | Admitting: Internal Medicine

## 2017-02-05 ENCOUNTER — Other Ambulatory Visit: Payer: Self-pay | Admitting: Internal Medicine

## 2017-02-05 DIAGNOSIS — M47812 Spondylosis without myelopathy or radiculopathy, cervical region: Secondary | ICD-10-CM | POA: Diagnosis not present

## 2017-02-05 DIAGNOSIS — R52 Pain, unspecified: Secondary | ICD-10-CM

## 2017-02-05 DIAGNOSIS — I10 Essential (primary) hypertension: Secondary | ICD-10-CM | POA: Diagnosis not present

## 2017-02-05 DIAGNOSIS — M542 Cervicalgia: Secondary | ICD-10-CM | POA: Insufficient documentation

## 2017-02-05 DIAGNOSIS — I341 Nonrheumatic mitral (valve) prolapse: Secondary | ICD-10-CM | POA: Diagnosis not present

## 2017-02-05 DIAGNOSIS — I6523 Occlusion and stenosis of bilateral carotid arteries: Secondary | ICD-10-CM | POA: Diagnosis not present

## 2017-02-05 DIAGNOSIS — E782 Mixed hyperlipidemia: Secondary | ICD-10-CM | POA: Diagnosis not present

## 2017-02-06 DIAGNOSIS — D2261 Melanocytic nevi of right upper limb, including shoulder: Secondary | ICD-10-CM | POA: Diagnosis not present

## 2017-02-06 DIAGNOSIS — D1801 Hemangioma of skin and subcutaneous tissue: Secondary | ICD-10-CM | POA: Diagnosis not present

## 2017-02-06 DIAGNOSIS — L820 Inflamed seborrheic keratosis: Secondary | ICD-10-CM | POA: Diagnosis not present

## 2017-02-06 DIAGNOSIS — L821 Other seborrheic keratosis: Secondary | ICD-10-CM | POA: Diagnosis not present

## 2017-02-06 DIAGNOSIS — L570 Actinic keratosis: Secondary | ICD-10-CM | POA: Diagnosis not present

## 2017-02-20 DIAGNOSIS — H2511 Age-related nuclear cataract, right eye: Secondary | ICD-10-CM | POA: Diagnosis not present

## 2017-02-20 DIAGNOSIS — H2513 Age-related nuclear cataract, bilateral: Secondary | ICD-10-CM | POA: Diagnosis not present

## 2017-02-27 DIAGNOSIS — I6523 Occlusion and stenosis of bilateral carotid arteries: Secondary | ICD-10-CM | POA: Diagnosis not present

## 2017-03-14 DIAGNOSIS — M47812 Spondylosis without myelopathy or radiculopathy, cervical region: Secondary | ICD-10-CM | POA: Diagnosis not present

## 2017-03-20 DIAGNOSIS — H16222 Keratoconjunctivitis sicca, not specified as Sjogren's, left eye: Secondary | ICD-10-CM | POA: Diagnosis not present

## 2017-03-20 DIAGNOSIS — H0289 Other specified disorders of eyelid: Secondary | ICD-10-CM | POA: Diagnosis not present

## 2017-03-20 DIAGNOSIS — H16223 Keratoconjunctivitis sicca, not specified as Sjogren's, bilateral: Secondary | ICD-10-CM | POA: Diagnosis not present

## 2017-03-20 DIAGNOSIS — H16221 Keratoconjunctivitis sicca, not specified as Sjogren's, right eye: Secondary | ICD-10-CM | POA: Diagnosis not present

## 2017-03-22 DIAGNOSIS — M542 Cervicalgia: Secondary | ICD-10-CM | POA: Diagnosis not present

## 2017-03-25 ENCOUNTER — Encounter (INDEPENDENT_AMBULATORY_CARE_PROVIDER_SITE_OTHER): Payer: Medicare Other | Admitting: Ophthalmology

## 2017-03-25 DIAGNOSIS — H2513 Age-related nuclear cataract, bilateral: Secondary | ICD-10-CM | POA: Diagnosis not present

## 2017-03-25 DIAGNOSIS — H353122 Nonexudative age-related macular degeneration, left eye, intermediate dry stage: Secondary | ICD-10-CM

## 2017-03-25 DIAGNOSIS — H43813 Vitreous degeneration, bilateral: Secondary | ICD-10-CM | POA: Diagnosis not present

## 2017-03-25 DIAGNOSIS — H35033 Hypertensive retinopathy, bilateral: Secondary | ICD-10-CM

## 2017-03-25 DIAGNOSIS — I10 Essential (primary) hypertension: Secondary | ICD-10-CM | POA: Diagnosis not present

## 2017-03-27 DIAGNOSIS — M542 Cervicalgia: Secondary | ICD-10-CM | POA: Diagnosis not present

## 2017-03-29 DIAGNOSIS — M542 Cervicalgia: Secondary | ICD-10-CM | POA: Diagnosis not present

## 2017-04-01 DIAGNOSIS — M542 Cervicalgia: Secondary | ICD-10-CM | POA: Diagnosis not present

## 2017-04-03 DIAGNOSIS — M542 Cervicalgia: Secondary | ICD-10-CM | POA: Diagnosis not present

## 2017-04-09 DIAGNOSIS — M542 Cervicalgia: Secondary | ICD-10-CM | POA: Diagnosis not present

## 2017-04-11 DIAGNOSIS — M542 Cervicalgia: Secondary | ICD-10-CM | POA: Diagnosis not present

## 2017-04-15 DIAGNOSIS — M542 Cervicalgia: Secondary | ICD-10-CM | POA: Diagnosis not present

## 2017-04-18 DIAGNOSIS — M542 Cervicalgia: Secondary | ICD-10-CM | POA: Diagnosis not present

## 2017-04-22 DIAGNOSIS — M542 Cervicalgia: Secondary | ICD-10-CM | POA: Diagnosis not present

## 2017-04-24 DIAGNOSIS — M542 Cervicalgia: Secondary | ICD-10-CM | POA: Diagnosis not present

## 2017-04-29 DIAGNOSIS — M542 Cervicalgia: Secondary | ICD-10-CM | POA: Diagnosis not present

## 2017-05-01 DIAGNOSIS — M542 Cervicalgia: Secondary | ICD-10-CM | POA: Diagnosis not present

## 2017-05-02 DIAGNOSIS — F419 Anxiety disorder, unspecified: Secondary | ICD-10-CM | POA: Diagnosis not present

## 2017-05-02 DIAGNOSIS — M542 Cervicalgia: Secondary | ICD-10-CM | POA: Diagnosis not present

## 2017-05-02 DIAGNOSIS — I10 Essential (primary) hypertension: Secondary | ICD-10-CM | POA: Diagnosis not present

## 2017-05-07 DIAGNOSIS — M4722 Other spondylosis with radiculopathy, cervical region: Secondary | ICD-10-CM | POA: Diagnosis not present

## 2017-05-07 DIAGNOSIS — M47812 Spondylosis without myelopathy or radiculopathy, cervical region: Secondary | ICD-10-CM | POA: Diagnosis not present

## 2017-05-13 DIAGNOSIS — M47812 Spondylosis without myelopathy or radiculopathy, cervical region: Secondary | ICD-10-CM | POA: Diagnosis not present

## 2017-05-13 DIAGNOSIS — M542 Cervicalgia: Secondary | ICD-10-CM | POA: Diagnosis not present

## 2017-05-14 DIAGNOSIS — I771 Stricture of artery: Secondary | ICD-10-CM | POA: Diagnosis not present

## 2017-05-14 DIAGNOSIS — Z411 Encounter for cosmetic surgery: Secondary | ICD-10-CM | POA: Diagnosis not present

## 2017-05-14 DIAGNOSIS — M47812 Spondylosis without myelopathy or radiculopathy, cervical region: Secondary | ICD-10-CM | POA: Diagnosis not present

## 2017-05-31 DIAGNOSIS — M5030 Other cervical disc degeneration, unspecified cervical region: Secondary | ICD-10-CM | POA: Diagnosis not present

## 2017-05-31 DIAGNOSIS — I771 Stricture of artery: Secondary | ICD-10-CM | POA: Diagnosis not present

## 2017-06-11 DIAGNOSIS — H2511 Age-related nuclear cataract, right eye: Secondary | ICD-10-CM | POA: Diagnosis not present

## 2017-06-13 ENCOUNTER — Other Ambulatory Visit: Payer: Self-pay | Admitting: Internal Medicine

## 2017-06-13 DIAGNOSIS — Z1231 Encounter for screening mammogram for malignant neoplasm of breast: Secondary | ICD-10-CM

## 2017-06-18 DIAGNOSIS — H2512 Age-related nuclear cataract, left eye: Secondary | ICD-10-CM | POA: Diagnosis not present

## 2017-07-17 ENCOUNTER — Ambulatory Visit
Admission: RE | Admit: 2017-07-17 | Discharge: 2017-07-17 | Disposition: A | Discharge status: Home / Self Care | Payer: Medicare Other | Source: Ambulatory Visit | Attending: Internal Medicine | Admitting: Internal Medicine

## 2017-07-17 DIAGNOSIS — Z1231 Encounter for screening mammogram for malignant neoplasm of breast: Secondary | ICD-10-CM | POA: Diagnosis not present

## 2017-09-12 DIAGNOSIS — I10 Essential (primary) hypertension: Secondary | ICD-10-CM | POA: Diagnosis not present

## 2017-09-12 DIAGNOSIS — R7301 Impaired fasting glucose: Secondary | ICD-10-CM | POA: Diagnosis not present

## 2017-09-12 DIAGNOSIS — E782 Mixed hyperlipidemia: Secondary | ICD-10-CM | POA: Diagnosis not present

## 2017-09-12 DIAGNOSIS — Z0001 Encounter for general adult medical examination with abnormal findings: Secondary | ICD-10-CM | POA: Diagnosis not present

## 2017-09-12 DIAGNOSIS — M542 Cervicalgia: Secondary | ICD-10-CM | POA: Diagnosis not present

## 2017-09-12 DIAGNOSIS — K219 Gastro-esophageal reflux disease without esophagitis: Secondary | ICD-10-CM | POA: Diagnosis not present

## 2017-09-19 DIAGNOSIS — Z23 Encounter for immunization: Secondary | ICD-10-CM | POA: Diagnosis not present

## 2017-10-21 DIAGNOSIS — I10 Essential (primary) hypertension: Secondary | ICD-10-CM | POA: Diagnosis not present

## 2017-10-21 DIAGNOSIS — M542 Cervicalgia: Secondary | ICD-10-CM | POA: Diagnosis not present

## 2017-10-21 DIAGNOSIS — E782 Mixed hyperlipidemia: Secondary | ICD-10-CM | POA: Diagnosis not present

## 2017-10-21 DIAGNOSIS — R7301 Impaired fasting glucose: Secondary | ICD-10-CM | POA: Diagnosis not present

## 2017-10-21 DIAGNOSIS — K219 Gastro-esophageal reflux disease without esophagitis: Secondary | ICD-10-CM | POA: Diagnosis not present

## 2017-11-14 ENCOUNTER — Other Ambulatory Visit: Payer: Self-pay

## 2017-11-14 MED ORDER — HYDRALAZINE HCL 10 MG PO TABS: 10.0000 mg | ORAL_TABLET | Freq: Three times a day (TID) | ORAL | 3 refills | Status: DC

## 2018-01-06 DIAGNOSIS — H16223 Keratoconjunctivitis sicca, not specified as Sjogren's, bilateral: Secondary | ICD-10-CM | POA: Diagnosis not present

## 2018-01-06 DIAGNOSIS — H26491 Other secondary cataract, right eye: Secondary | ICD-10-CM | POA: Diagnosis not present

## 2018-01-06 DIAGNOSIS — Z961 Presence of intraocular lens: Secondary | ICD-10-CM | POA: Diagnosis not present

## 2018-01-06 DIAGNOSIS — H353130 Nonexudative age-related macular degeneration, bilateral, stage unspecified: Secondary | ICD-10-CM | POA: Diagnosis not present

## 2018-01-06 DIAGNOSIS — H26493 Other secondary cataract, bilateral: Secondary | ICD-10-CM | POA: Diagnosis not present

## 2018-01-06 DIAGNOSIS — H26492 Other secondary cataract, left eye: Secondary | ICD-10-CM | POA: Diagnosis not present

## 2018-01-19 ENCOUNTER — Other Ambulatory Visit: Payer: Self-pay | Admitting: Internal Medicine

## 2018-01-22 DIAGNOSIS — Z961 Presence of intraocular lens: Secondary | ICD-10-CM | POA: Diagnosis not present

## 2018-01-22 DIAGNOSIS — H26491 Other secondary cataract, right eye: Secondary | ICD-10-CM | POA: Diagnosis not present

## 2018-01-22 DIAGNOSIS — H26492 Other secondary cataract, left eye: Secondary | ICD-10-CM | POA: Diagnosis not present

## 2018-02-17 ENCOUNTER — Other Ambulatory Visit: Payer: Self-pay

## 2018-02-17 MED ORDER — VALSARTAN-HYDROCHLOROTHIAZIDE 160-12.5 MG PO TABS: 1.0000 | ORAL_TABLET | Freq: Every day | ORAL | 1 refills | Status: DC

## 2018-02-20 ENCOUNTER — Other Ambulatory Visit: Payer: Self-pay

## 2018-02-20 DIAGNOSIS — H00011 Hordeolum externum right upper eyelid: Secondary | ICD-10-CM | POA: Diagnosis not present

## 2018-02-20 MED ORDER — VALSARTAN-HYDROCHLOROTHIAZIDE 160-12.5 MG PO TABS: 1.0000 | ORAL_TABLET | Freq: Every day | ORAL | 5 refills | Status: DC

## 2018-02-24 ENCOUNTER — Other Ambulatory Visit: Payer: Self-pay | Admitting: Internal Medicine

## 2018-03-11 ENCOUNTER — Ambulatory Visit (INDEPENDENT_AMBULATORY_CARE_PROVIDER_SITE_OTHER): Payer: Medicare Other | Admitting: Nurse Practitioner

## 2018-03-11 ENCOUNTER — Encounter: Payer: Self-pay | Admitting: Nurse Practitioner

## 2018-03-11 VITALS — BP 162/81 | HR 56 | Resp 16 | Ht 63.0 in | Wt 129.0 lb

## 2018-03-11 DIAGNOSIS — Z23 Encounter for immunization: Secondary | ICD-10-CM

## 2018-03-11 DIAGNOSIS — I10 Essential (primary) hypertension: Secondary | ICD-10-CM | POA: Diagnosis not present

## 2018-03-11 DIAGNOSIS — E782 Mixed hyperlipidemia: Secondary | ICD-10-CM

## 2018-03-11 MED ORDER — TETANUS-DIPHTH-ACELL PERTUSSIS 5-2-15.5 LF-MCG/0.5 IM SUSP: 0.5000 mL | Freq: Once | INTRAMUSCULAR | 0 refills | Status: AC

## 2018-03-11 NOTE — Progress Notes (Signed)
[facility] [place], [place] [address]  Internal MEDICINE  Office Visit Note  Patient Name: Amy Gilmore  [id number]  [id number]  Date of Service: 03/11/2018  Chief Complaint  Patient presents with  . Hypertension    generally elevated when first arriving into the medical office.     Hypertension  This is a chronic problem. The current episode started more than 1 year ago. The problem is unchanged. The problem is controlled. Pertinent negatives include no chest pain, headaches, neck pain, palpitations or shortness of breath. There are no associated agents to hypertension. Risk factors for coronary artery disease include post-menopausal state. Past treatments include beta blockers and diuretics. The current treatment provides moderate improvement. There are no compliance problems.     Pt is here for routine follow up.    Current Medication: Outpatient Encounter Medications as of 03/11/2018  Medication Sig  . acetaminophen (TYLENOL) 500 MG tablet Take 1,000 mg by mouth every 6 (six) hours as needed. Pain   . aspirin 81 MG tablet Take 81 mg by mouth daily.  Marland Kitchen atorvastatin (LIPITOR) 10 MG tablet Take 10 mg by mouth at bedtime.   Marland Kitchen doxycycline (MONODOX) 100 MG capsule TK 1 C PO BID WF  . Glucosamine 500 MG CAPS Take 500 mg by mouth daily.   . hydrALAZINE (APRESOLINE) 10 MG tablet Take 1 tablet (10 mg total) by mouth 3 (three) times daily.  . hydrocortisone (ANUSOL-HC) 25 MG suppository Place 1 suppository (25 mg total) rectally 2 (two) times daily as needed for hemorrhoids or itching.  . hyoscyamine (LEVSIN SL) 0.125 MG SL tablet Place 2 tablets (0.25 mg total) under the tongue every 4 (four) hours as needed.  . Hypromellose (GENTEAL) 0.3 % SOLN Place 1-2 drops into both eyes 2 (two) times daily as needed. Dry eyes   . meclizine (ANTIVERT) 25 MG tablet Take 25 mg by mouth as needed. Inner ear  . meloxicam (MOBIC) 15 MG tablet TAKE 1 TABLET BY MOUTH  EVERY DAY AFTER MEALS  . meloxicam (MOBIC) 7.5 MG tablet Take 7.5 mg by mouth as needed.  . Multiple Vitamin (MULTI-VITAMINS) TABS Take by mouth.  . Multiple Vitamins-Minerals (CENTRUM SILVER) tablet Take 1 tablet by mouth daily.   . propranolol (INDERAL LA) 60 MG 24 hr capsule Take 60 mg by mouth daily before breakfast.   . RESTASIS 0.05 % ophthalmic emulsion INSTILL 1 DROP INTO BOTH EYES TWICE A DAY  . valsartan-hydrochlorothiazide (DIOVAN-HCT) 160-12.5 MG tablet Take 1 tablet by mouth daily.  . Tdap (ADACEL) 04-03-14.5 LF-MCG/0.5 injection Inject 0.5 mLs into the muscle once for 1 dose.   Facility-Administered Encounter Medications as of 03/11/2018  Medication  . 0.9 %  sodium chloride infusion    Surgical History: Past Surgical History:  Procedure Laterality Date  . APPENDECTOMY  1955  . BUNIONECTOMY  1995   and osteotomy  . COLONOSCOPY  2017  . CYSTOSCOPY  02/22/2012   Procedure: CYSTOSCOPY;  Surgeon: Claybon Jabs, MD;  Location: [facility];  Service: Urology;  Laterality: Right;  removal of ureteral stent  . FLEXIBLE SIGMOIDOSCOPY  08/12/2012   Procedure: FLEXIBLE SIGMOIDOSCOPY;  Surgeon: Inda Castle, MD;  Location: [facility];  Service: Endoscopy;  Laterality: N/A;  . NEPHROLITHOTOMY  01/28/2012-  right ureteral stent placement   Procedure: NEPHROLITHOTOMY PERCUTANEOUS;  Surgeon: Claybon Jabs, MD;  Location: [facility];  Service: Urology;  Laterality: Right;  . OTHER SURGICAL HISTORY  01/28/12  right percutaneous nephroliathothoma  . REFRACTIVE SURGERY  yrs ago  . [place] and 2006   right  . URETEROSCOPY  02/22/2012   Procedure: URETEROSCOPY;  Surgeon: Claybon Jabs, MD;  Location: [facility];  Service: Urology;  Laterality: Right;  URETEROSCOPY WITH STONE EXTRACTION   . VAGINAL HYSTERECTOMY  1985    Medical History: Past Medical History:  Diagnosis Date  . Adenomatous colon polyp 07/03/2012  . Diverticulosis   .  Frequency of urination   . History of kidney stones   . Hyperlipidemia   . Hypertension   . Internal hemorrhoids   . Mitral valve prolapse per dr wall note 09/12--  stable  . PAC (premature atrial contraction) followed by cardiologist --  dr wall--  last visit  sept 12  note in epic--  states stable  . Palpitations hx pac's-   occasional  . Renal calculus, right followed by dr Karsten Ro   February 2013  . Right shoulder pain Dec 07, 2011 fall   pain at times with positioning  . Urgency of micturation   . Vertigo, intermittent     Family History: Family History  Problem Relation Age of Onset  . Liver cancer Mother   . Heart attack Sister   . Peripheral vascular disease Sister   . Colon cancer Neg Hx     Social History   Socioeconomic History  . Marital status: Married    Spouse name: Not on file  . Number of children: 3  . Years of education: Not on file  . Highest education level: Not on file  Occupational History  . Occupation: Housewife  Social Needs  . Financial resource strain: Not on file  . Food insecurity:    Worry: Not on file    Inability: Not on file  . Transportation needs:    Medical: Not on file    Non-medical: Not on file  Tobacco Use  . Smoking status: Former Smoker    Packs/day: 1.00    Years: 15.00    Pack years: 15.00    Last attempt to quit: 12/04/1975    Years since quitting: 42.2  . Smokeless tobacco: Never Used  Substance and Sexual Activity  . Alcohol use: Yes    Alcohol/week: 4.2 oz    Types: 7 Glasses of wine per week    Comment: Socially  . Drug use: No  . Sexual activity: Not on file  Lifestyle  . Physical activity:    Days per week: Not on file    Minutes per session: Not on file  . Stress: Not on file  Relationships  . Social connections:    Talks on phone: Not on file    Gets together: Not on file    Attends religious service: Not on file    Active member of club or organization: Not on file    Attends meetings of clubs or  organizations: Not on file    Relationship status: Not on file  . Intimate partner violence:    Fear of current or ex partner: Not on file    Emotionally abused: Not on file    Physically abused: Not on file    Forced sexual activity: Not on file  Other Topics Concern  . Not on file  Social History Narrative   Married.   Gets regular exercise.   Caffeine daily. 3 cups of coffee or soda per day      Review of Systems  Constitutional: Negative for activity change, chills, fatigue and unexpected weight change.  HENT: Negative for congestion, postnasal drip, rhinorrhea, sneezing and sore throat.   Eyes: Negative.  Negative for redness.  Respiratory: Negative for cough, chest tightness and shortness of breath.   Cardiovascular: Negative for chest pain and palpitations.       Elevated blood pressure when first arriving in provider office. bp at home is generally 120s/80s  Gastrointestinal: Negative for abdominal pain, constipation, diarrhea, nausea and vomiting.  Endocrine: Negative for cold intolerance, heat intolerance, polydipsia, polyphagia and polyuria.  Genitourinary: Negative.  Negative for dysuria and frequency.  Musculoskeletal: Negative for arthralgias, back pain, joint swelling and neck pain.  Skin: Negative for rash.  Allergic/Immunologic: Negative for environmental allergies, food allergies and immunocompromised state.  Neurological: Negative for dizziness, tremors, numbness and headaches.  Hematological: Negative for adenopathy. Does not bruise/bleed easily.  Psychiatric/Behavioral: Negative for behavioral problems (Depression), sleep disturbance and suicidal ideas. The patient is not nervous/anxious.     Today's Vitals   03/11/18 0947  BP: (!) 162/81  Pulse: (!) 56  Resp: 16  SpO2: 97%  Weight: 129 lb (58.5 kg)  Height: 5\' 3"  (1.6 m)    Physical Exam  Constitutional: She is oriented to person, place, and time. She appears well-developed and well-nourished. No  distress.  HENT:  Head: Normocephalic and atraumatic.  Mouth/Throat: Oropharynx is clear and moist. No oropharyngeal exudate.  Eyes: Pupils are equal, round, and reactive to light. EOM are normal.  Neck: Normal range of motion. Neck supple. No JVD present. Carotid bruit is not present. No tracheal deviation present. No thyromegaly present.  Cardiovascular: Normal rate, regular rhythm and normal heart sounds. Exam reveals no gallop and no friction rub.  No murmur heard. Pulmonary/Chest: Effort normal and breath sounds normal. No respiratory distress. She has no wheezes. She has no rales. She exhibits no tenderness.  Abdominal: Soft. Bowel sounds are normal. There is no tenderness.  Musculoskeletal: Normal range of motion.  Lymphadenopathy:    She has no cervical adenopathy.  Neurological: She is alert and oriented to person, place, and time. No cranial nerve deficit.  Skin: Skin is warm and dry. She is not diaphoretic.  Psychiatric: She has a normal mood and affect. Her behavior is normal. Judgment and thought content normal.  Nursing note and vitals reviewed.  Assessment/Plan: 1. Essential hypertension Generally stable. Continue bp medication as prescribed.   2. Mixed hyperlipidemia Recent lipid panel stable. Continue cholesterol medication as prescribed.   3. Need for prophylactic vaccination with combined diphtheria-tetanus-pertussis (DTaP) vaccine - Tdap (ADACEL) 04-03-14.5 LF-MCG/0.5 injection; Inject 0.5 mLs into the muscle once for 1 dose.  Dispense: 0.5 mL; Refill: 0  General Counseling: Kianah verbalizes understanding of the findings of todays visit and agrees with plan of treatment. I have discussed any further diagnostic evaluation that may be needed or ordered today. We also reviewed her medications today. she has been encouraged to call the office with any questions or concerns that should arise related to todays visit.   This patient was seen by Leretha Pol, FNP- C in  Collaboration with Dr Lavera Guise as a part of collaborative care agreement     Meds ordered this encounter  Medications  . Tdap (ADACEL) 04-03-14.5 LF-MCG/0.5 injection    Sig: Inject 0.5 mLs into the muscle once for 1 dose.    Dispense:  0.5 mL    Refill:  0    Order Specific Question:   Supervising Provider  AnswerLavera Guise [[id number]]    Time spent: 42 Minutes      Dr Lavera Guise Internal medicine

## 2018-03-17 DIAGNOSIS — L570 Actinic keratosis: Secondary | ICD-10-CM | POA: Diagnosis not present

## 2018-03-17 DIAGNOSIS — L821 Other seborrheic keratosis: Secondary | ICD-10-CM | POA: Diagnosis not present

## 2018-03-17 DIAGNOSIS — I788 Other diseases of capillaries: Secondary | ICD-10-CM | POA: Diagnosis not present

## 2018-03-17 DIAGNOSIS — D1801 Hemangioma of skin and subcutaneous tissue: Secondary | ICD-10-CM | POA: Diagnosis not present

## 2018-03-17 IMAGING — MG MM DIGITAL SCREENING BILAT W/ TOMO W/ CAD
9 of 13 series · 9 of 29 positions shown · non-contrast
Comparison: Previous exam(s).

CLINICAL DATA: Screening.

EXAM:
2D DIGITAL SCREENING BILATERAL MAMMOGRAM WITH CAD AND ADJUNCT TOMO

[R XCCL]
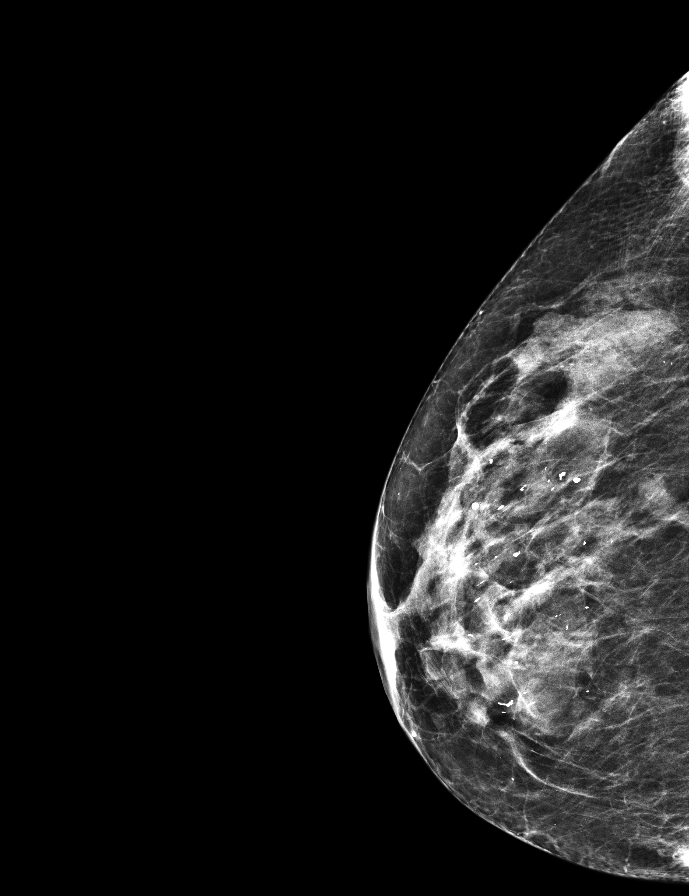

[L CC synth-2D]
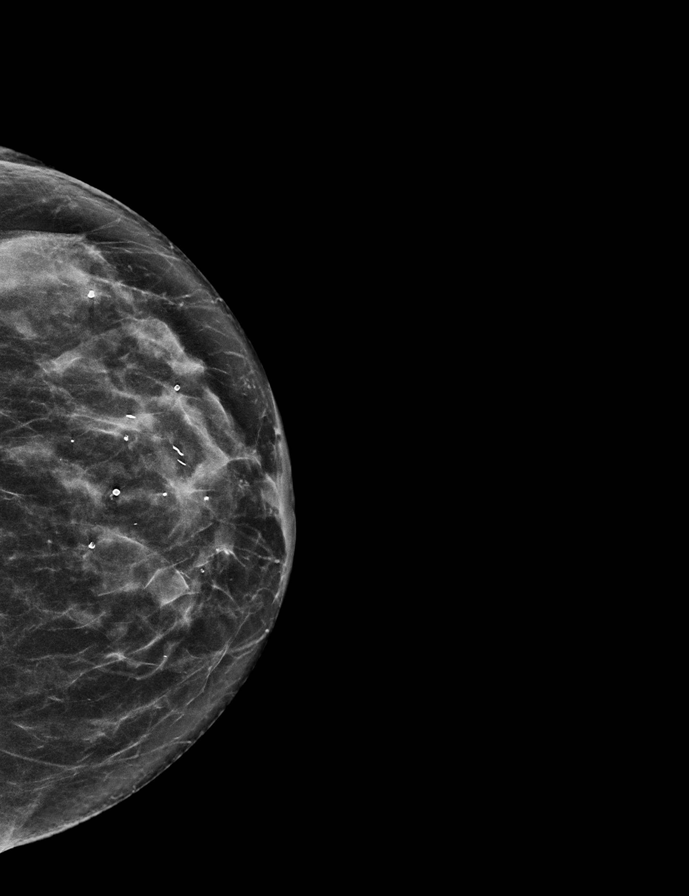

[R CC synth-2D]
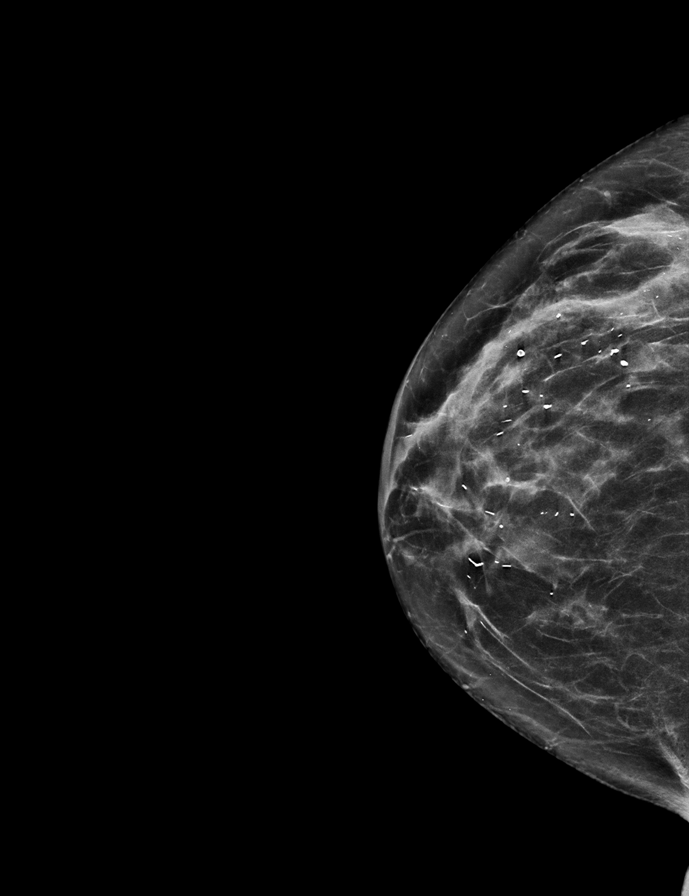

[R MLO]
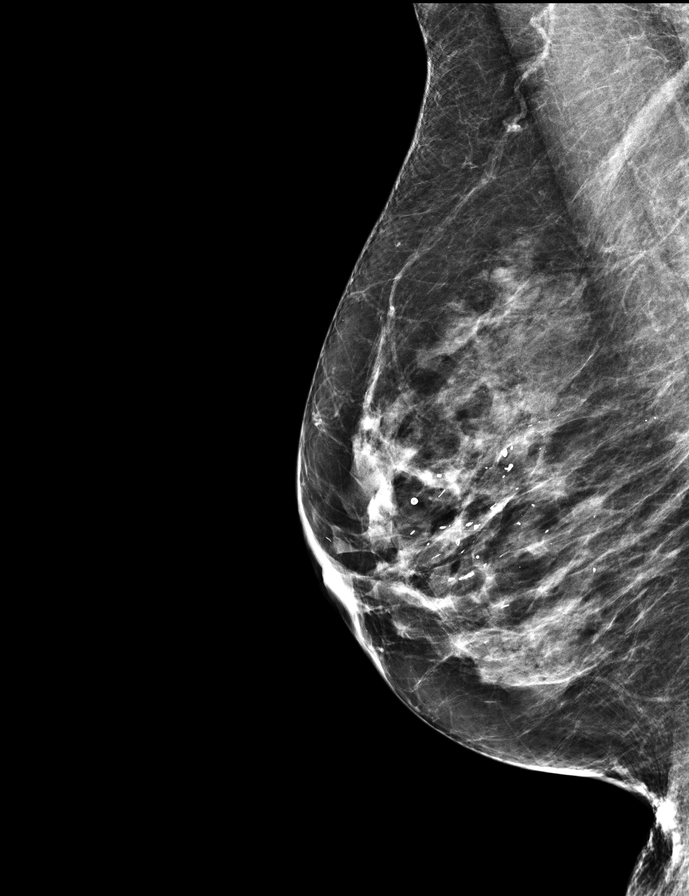

[L MLO synth-2D]
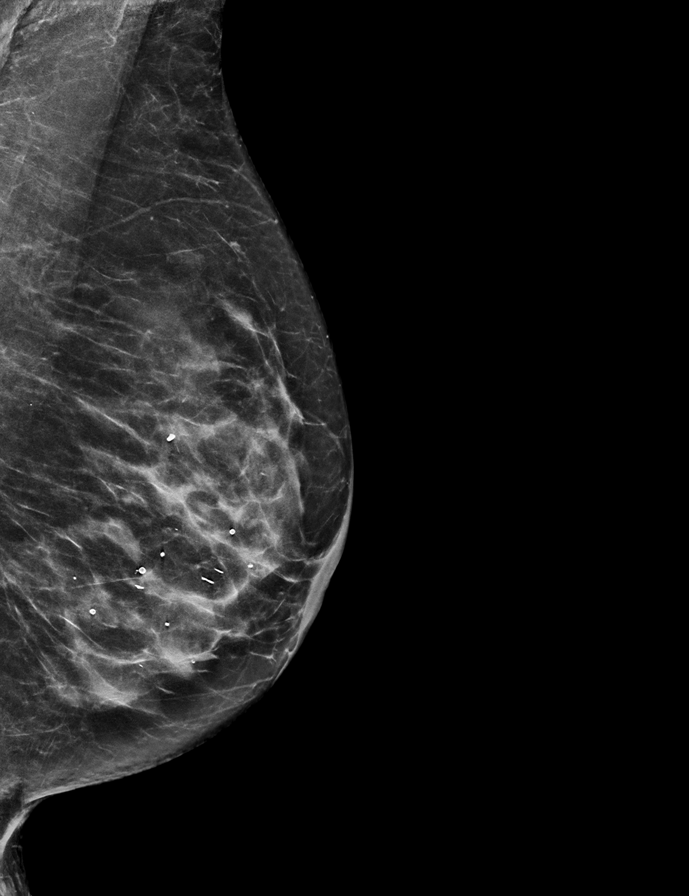

[R MLO synth-2D]
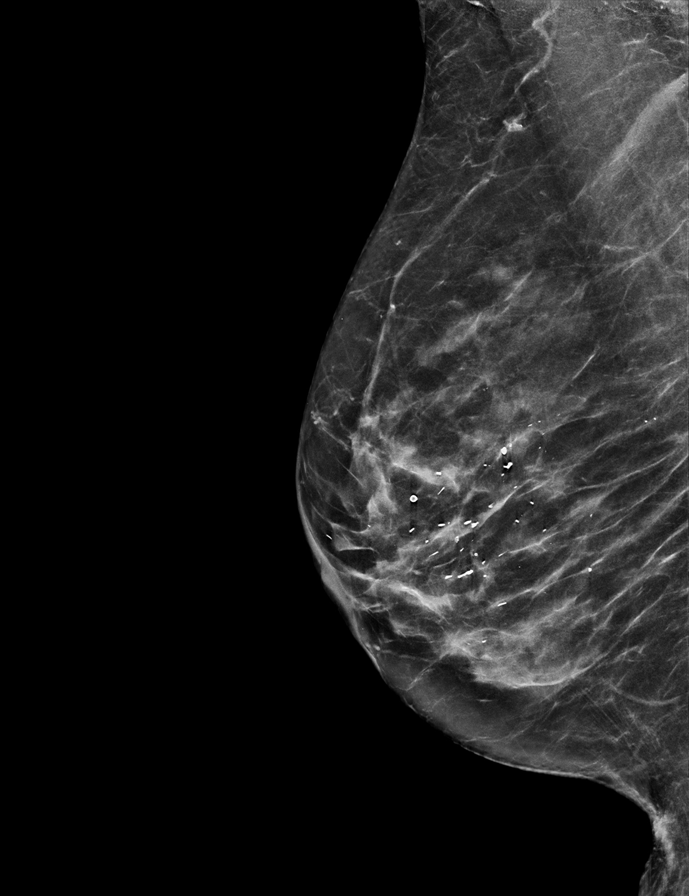

[L CC]
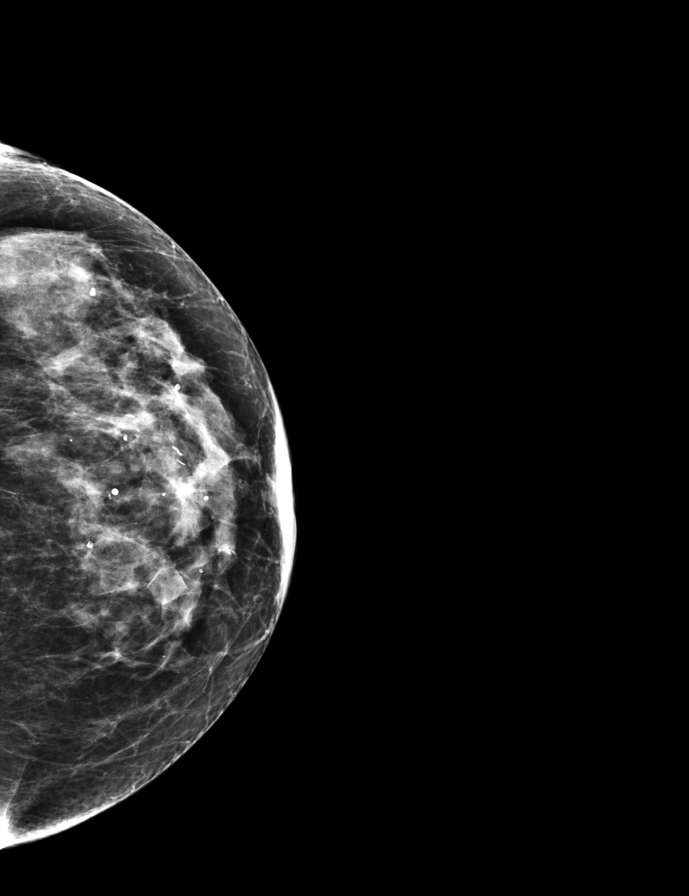

[R CC]
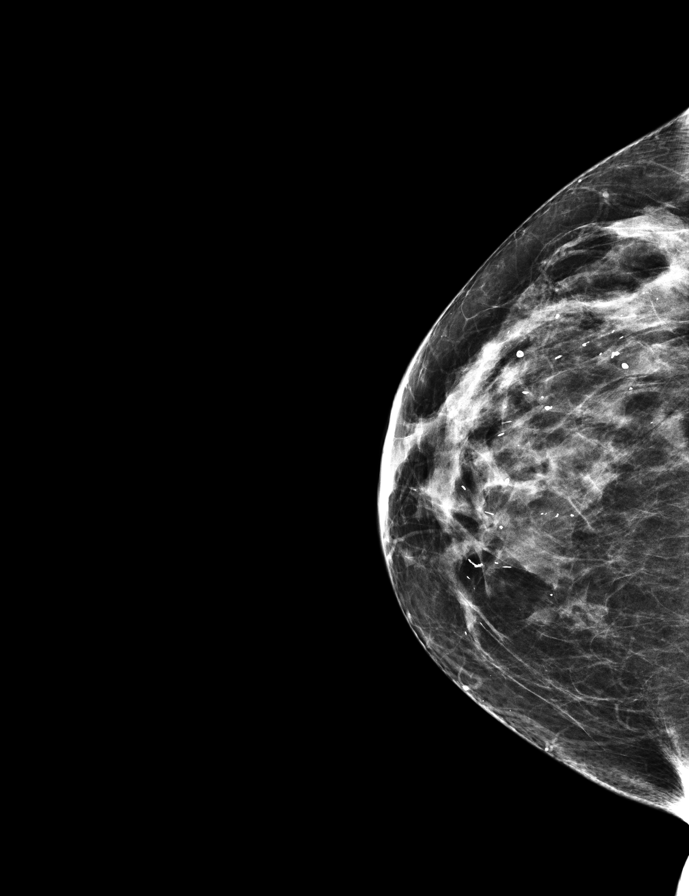

[L MLO]
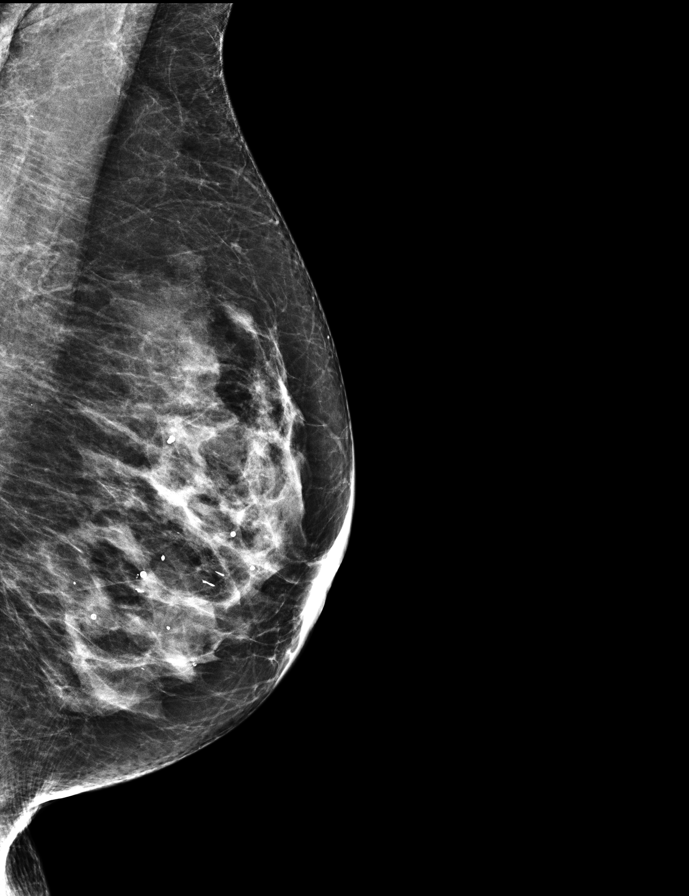

[9 of 29 positions shown; findings below may reference images not displayed]

ACR Breast Density Category c: The breast tissue is heterogeneously
dense, which may obscure small masses.
FINDINGS: There are no findings suspicious for malignancy. Images were
processed with CAD.
IMPRESSION: No mammographic evidence of malignancy. A result letter of this
screening mammogram will be mailed directly to the patient.

RECOMMENDATION:
Screening mammogram in one year. (Code:TN-0-K4T)

BI-RADS CATEGORY  1: Negative.

## 2018-03-19 ENCOUNTER — Other Ambulatory Visit: Payer: Self-pay | Admitting: Internal Medicine

## 2018-03-19 MED ORDER — VALSARTAN-HYDROCHLOROTHIAZIDE 160-12.5 MG PO TABS: 1.0000 | ORAL_TABLET | Freq: Every day | ORAL | 2 refills | Status: DC

## 2018-03-19 MED ORDER — ATORVASTATIN CALCIUM 10 MG PO TABS: 10.0000 mg | ORAL_TABLET | Freq: Every day | ORAL | 2 refills | Status: DC

## 2018-04-05 NOTE — Progress Notes (Signed)
Cardiology Office Note  Date:  04/07/2018   ID:  Amy Gilmore, DOB 01/04/1936, MRN [id number]  PCP:  Lavera Guise, MD   Chief Complaint  Patient presents with  . other    12 month follow up. Meds reviewed by the pt. verbally. "doing well."     HPI:  Amy Gilmore is a very pleasant 82 year old woman with notes indicating a history of mitral valve prolapse,  palpitations and PACs,  hypertension  hyperlipidemia,  echocardiogram in 2011 did not mention mitral valve prolapse but did suggest mild, possible mild to moderate MR, normal ejection fraction. Prior smoking history, stopped 30 years ago She presents today for routine follow-up of her mitral valve regurgitation and APCs  Feeling well on today's visit, denies any significant problems Started on hydralazine 10 mg 3 times daily by primary care Was having some orthostasis symptoms now takes it 1 or 2 times per day but still having some orthostasis Checks her blood pressure at home typically well controlled, low  Prior history of bleeding internal hemorrhoids, seen by GI and had banding  No problems recently No regular exercise program but is active  No significant tachycardia, palpitations No shortness of breath or chest pain  EKG personally reviewed by myself on todays visit Shows normal sinus rhythm rate 60 bpm no significant ST or T wave changes  Previous lab work  from primary care showing total cholesterol 159, LDL 80   PMH:   has a past medical history of Adenomatous colon polyp (07/03/2012), Diverticulosis, Frequency of urination, History of kidney stones, Hyperlipidemia, Hypertension, Internal hemorrhoids, Mitral valve prolapse (per dr wall note 09/12--  stable), PAC (premature atrial contraction) (followed by cardiologist --  dr wall--  last visit  sept 12  note in epic--  states stable), Palpitations (hx pac's-   occasional), Renal calculus, right (followed by dr Karsten Ro), Right shoulder pain (Dec 07, 2011  fall), Urgency of micturation, and Vertigo, intermittent.  PSH:    Past Surgical History:  Procedure Laterality Date  . APPENDECTOMY  1955  . BUNIONECTOMY  1995   and osteotomy  . COLONOSCOPY  2017  . CYSTOSCOPY  02/22/2012   Procedure: CYSTOSCOPY;  Surgeon: Claybon Jabs, MD;  Location: [facility];  Service: Urology;  Laterality: Right;  removal of ureteral stent  . FLEXIBLE SIGMOIDOSCOPY  08/12/2012   Procedure: FLEXIBLE SIGMOIDOSCOPY;  Surgeon: Inda Castle, MD;  Location: [facility];  Service: Endoscopy;  Laterality: N/A;  . NEPHROLITHOTOMY  01/28/2012-  right ureteral stent placement   Procedure: NEPHROLITHOTOMY PERCUTANEOUS;  Surgeon: Claybon Jabs, MD;  Location: [facility];  Service: Urology;  Laterality: Right;  . OTHER SURGICAL HISTORY  01/28/12   right percutaneous nephroliathothoma  . REFRACTIVE SURGERY  yrs ago  . [place] and 2006   right  . URETEROSCOPY  02/22/2012   Procedure: URETEROSCOPY;  Surgeon: Claybon Jabs, MD;  Location: [facility];  Service: Urology;  Laterality: Right;  URETEROSCOPY WITH STONE EXTRACTION   . VAGINAL HYSTERECTOMY  1985    Current Outpatient Medications  Medication Sig Dispense Refill  . acetaminophen (TYLENOL) 500 MG tablet Take 1,000 mg by mouth every 6 (six) hours as needed. Pain     . aspirin 81 MG tablet Take 81 mg by mouth daily.    Marland Kitchen atorvastatin (LIPITOR) 10 MG tablet Take 1 tablet (10 mg total) by mouth at bedtime. 90 tablet 2  . Glucosamine 500 MG CAPS Take  500 mg by mouth daily.     . hydrALAZINE (APRESOLINE) 10 MG tablet Take 1 tablet (10 mg total) by mouth 3 (three) times daily. 90 tablet 3  . hydrocortisone (ANUSOL-HC) 25 MG suppository Place 1 suppository (25 mg total) rectally 2 (two) times daily as needed for hemorrhoids or itching. 12 suppository 0  . hyoscyamine (LEVSIN SL) 0.125 MG SL tablet Place 2 tablets (0.25 mg total) under the tongue every 4 (four) hours as needed. 30  tablet 0  . Hypromellose (GENTEAL) 0.3 % SOLN Place 1-2 drops into both eyes 2 (two) times daily as needed. Dry eyes     . meclizine (ANTIVERT) 25 MG tablet Take 25 mg by mouth as needed. Inner ear    . meloxicam (MOBIC) 15 MG tablet TAKE 1 TABLET BY MOUTH EVERY DAY AFTER MEALS 30 tablet 0  . meloxicam (MOBIC) 7.5 MG tablet Take 7.5 mg by mouth as needed.    . Multiple Vitamin (MULTI-VITAMINS) TABS Take by mouth.    . Multiple Vitamins-Minerals (CENTRUM SILVER) tablet Take 1 tablet by mouth daily.     . propranolol (INDERAL LA) 60 MG 24 hr capsule Take 60 mg by mouth daily before breakfast.     . RESTASIS 0.05 % ophthalmic emulsion INSTILL 1 DROP INTO BOTH EYES TWICE A DAY  4  . valsartan-hydrochlorothiazide (DIOVAN-HCT) 160-12.5 MG tablet Take 1 tablet by mouth daily. 90 tablet 2   Current Facility-Administered Medications  Medication Dose Route Frequency Provider Last Rate Last Dose  . 0.9 %  sodium chloride infusion  500 mL Intravenous Continuous Armbruster, Carlota Raspberry, MD         Allergies:   Codeine and Sulfa antibiotics   Social History:  The patient  reports that she quit smoking about 42 years ago. She has a 15.00 pack-year smoking history. She has never used smokeless tobacco. She reports that she drinks about 4.2 oz of alcohol per week. She reports that she does not use drugs.   Family History:   family history includes Heart attack in her sister; Liver cancer in her mother; Peripheral vascular disease in her sister.    Review of Systems: Review of Systems  Constitutional: Negative.   Respiratory: Negative.   Cardiovascular: Negative.   Gastrointestinal: Negative.        Indigestion  Musculoskeletal: Negative.   Neurological: Negative.   Psychiatric/Behavioral: Negative.   All other systems reviewed and are negative.    PHYSICAL EXAM: VS:  BP 120/70 (BP Location: Left Arm, Patient Position: Sitting, Cuff Size: Normal)   Pulse 60   Ht 5\' 3"  (1.6 m)   Wt 131 lb 8 oz  (59.6 kg)   BMI 23.29 kg/m  , BMI Body mass index is 23.29 kg/m. Constitutional:  oriented to person, place, and time. No distress.  HENT:  Head: Normocephalic and atraumatic.  Eyes:  no discharge. No scleral icterus.  Neck: Normal range of motion. Neck supple. No JVD present.  Cardiovascular: Normal rate, regular rhythm, normal heart sounds and intact distal pulses. Exam reveals no gallop and no friction rub. No edema No murmur heard. Pulmonary/Chest: Effort normal and breath sounds normal. No stridor. No respiratory distress.  no wheezes.  no rales.  no tenderness.  Abdominal: Soft.  no distension.  no tenderness.  Musculoskeletal: Normal range of motion.  no  tenderness or deformity.  Neurological:  normal muscle tone. Coordination normal. No atrophy Skin: Skin is warm and dry. No rash noted. not diaphoretic.  Psychiatric:  normal mood and affect. behavior is normal. Thought content normal.    Recent Labs: No results found for requested labs within last 8760 hours.    Lipid Panel No results found for: CHOL, HDL, LDLCALC, TRIG    Wt Readings from Last 3 Encounters:  04/07/18 131 lb 8 oz (59.6 kg)  03/11/18 129 lb (58.5 kg)  12/06/16 130 lb (59 kg)       ASSESSMENT AND PLAN:  Essential hypertension - Plan: EKG 12-Lead blood pressure low, she is having some mild orthostasis symptoms She is only taking hydralazine once maybe sometimes twice a day  Recommended she hold the hydralazine for now and continue to monitor blood pressure Suggested she show these numbers to primary care  Mixed hyperlipidemia Prior lipid panel several years ago excellent range Continue statin  Mitral valve insufficiency, unspecified etiology No significant murmur appreciated on exam No further work-up needed  Palpitations Denies any symptoms, overall feeling well with no complaints  has not required diltiazem for breakthrough   Total encounter time more than 25 minutes  Greater than 50%  was spent in counseling and coordination of care with the patient   Disposition:   F/U  12 months as needed   Orders Placed This Encounter  Procedures  . EKG 12-Lead     Signed, Esmond Plants, M.D., Ph.D. 04/07/2018  [facility] [place], [place] [phone]

## 2018-04-07 ENCOUNTER — Ambulatory Visit (INDEPENDENT_AMBULATORY_CARE_PROVIDER_SITE_OTHER): Payer: Medicare Other | Admitting: Cardiovascular Disease

## 2018-04-07 ENCOUNTER — Encounter: Payer: Self-pay | Admitting: Cardiovascular Disease

## 2018-04-07 VITALS — BP 120/70 | HR 60 | Ht 63.0 in | Wt 131.5 lb

## 2018-04-07 DIAGNOSIS — E782 Mixed hyperlipidemia: Secondary | ICD-10-CM

## 2018-04-07 DIAGNOSIS — R002 Palpitations: Secondary | ICD-10-CM | POA: Diagnosis not present

## 2018-04-07 DIAGNOSIS — I10 Essential (primary) hypertension: Secondary | ICD-10-CM

## 2018-04-07 DIAGNOSIS — I340 Nonrheumatic mitral (valve) insufficiency: Secondary | ICD-10-CM

## 2018-04-07 NOTE — Patient Instructions (Addendum)
Medication Instructions:   Hold the hydralazine, Monitor pressures Take only if needed for high pressure  Labwork:  No new labs needed  Testing/Procedures:  No further testing at this time   Follow-Up: It was a pleasure seeing you in the office today. Please call us if you have new issues that need to be addressed before your next appt.  [phone]  Your physician wants you to follow-up in: 12 months as needed.  You will receive a reminder letter in the mail two months in advance. If you don't receive a letter, please call our office to schedule the follow-up appointment.  If you need a refill on your cardiac medications before your next appointment, please call your pharmacy.  For educational health videos Log in to : [email or web address] Or : [email or web address], password : [email or web address]

## 2018-04-30 ENCOUNTER — Other Ambulatory Visit: Payer: Self-pay | Admitting: Internal Medicine

## 2018-04-30 MED ORDER — MELOXICAM 15 MG PO TABS: ORAL_TABLET | ORAL | 3 refills | Status: DC

## 2018-05-22 ENCOUNTER — Other Ambulatory Visit: Payer: Self-pay

## 2018-05-22 ENCOUNTER — Observation Stay: Payer: Medicare Other

## 2018-05-22 ENCOUNTER — Emergency Department: Payer: Medicare Other

## 2018-05-22 ENCOUNTER — Observation Stay
Admission: EM | Admit: 2018-05-22 | Discharge: 2018-05-23 | Disposition: A | Discharge status: Home / Self Care | Payer: Medicare Other | Attending: Specialist | Admitting: Specialist

## 2018-05-22 DIAGNOSIS — R42 Dizziness and giddiness: Secondary | ICD-10-CM | POA: Insufficient documentation

## 2018-05-22 DIAGNOSIS — Z791 Long term (current) use of non-steroidal anti-inflammatories (NSAID): Secondary | ICD-10-CM | POA: Insufficient documentation

## 2018-05-22 DIAGNOSIS — Z961 Presence of intraocular lens: Secondary | ICD-10-CM | POA: Insufficient documentation

## 2018-05-22 DIAGNOSIS — Z7982 Long term (current) use of aspirin: Secondary | ICD-10-CM | POA: Diagnosis not present

## 2018-05-22 DIAGNOSIS — R29818 Other symptoms and signs involving the nervous system: Secondary | ICD-10-CM | POA: Diagnosis not present

## 2018-05-22 DIAGNOSIS — M1990 Unspecified osteoarthritis, unspecified site: Secondary | ICD-10-CM | POA: Insufficient documentation

## 2018-05-22 DIAGNOSIS — Z882 Allergy status to sulfonamides status: Secondary | ICD-10-CM | POA: Diagnosis not present

## 2018-05-22 DIAGNOSIS — I341 Nonrheumatic mitral (valve) prolapse: Secondary | ICD-10-CM | POA: Insufficient documentation

## 2018-05-22 DIAGNOSIS — R2681 Unsteadiness on feet: Secondary | ICD-10-CM | POA: Insufficient documentation

## 2018-05-22 DIAGNOSIS — Z87442 Personal history of urinary calculi: Secondary | ICD-10-CM | POA: Insufficient documentation

## 2018-05-22 DIAGNOSIS — Z87891 Personal history of nicotine dependence: Secondary | ICD-10-CM | POA: Diagnosis not present

## 2018-05-22 DIAGNOSIS — Z8249 Family history of ischemic heart disease and other diseases of the circulatory system: Secondary | ICD-10-CM | POA: Diagnosis not present

## 2018-05-22 DIAGNOSIS — Z79899 Other long term (current) drug therapy: Secondary | ICD-10-CM | POA: Insufficient documentation

## 2018-05-22 DIAGNOSIS — Z885 Allergy status to narcotic agent status: Secondary | ICD-10-CM | POA: Diagnosis not present

## 2018-05-22 DIAGNOSIS — Z9889 Other specified postprocedural states: Secondary | ICD-10-CM | POA: Insufficient documentation

## 2018-05-22 DIAGNOSIS — Z800 Family history of malignant neoplasm of digestive organs: Secondary | ICD-10-CM | POA: Insufficient documentation

## 2018-05-22 DIAGNOSIS — E785 Hyperlipidemia, unspecified: Secondary | ICD-10-CM | POA: Insufficient documentation

## 2018-05-22 DIAGNOSIS — I491 Atrial premature depolarization: Secondary | ICD-10-CM | POA: Diagnosis not present

## 2018-05-22 DIAGNOSIS — I639 Cerebral infarction, unspecified: Secondary | ICD-10-CM | POA: Diagnosis not present

## 2018-05-22 DIAGNOSIS — I10 Essential (primary) hypertension: Secondary | ICD-10-CM | POA: Diagnosis not present

## 2018-05-22 DIAGNOSIS — Z86010 Personal history of colonic polyps: Secondary | ICD-10-CM | POA: Insufficient documentation

## 2018-05-22 DIAGNOSIS — H532 Diplopia: Secondary | ICD-10-CM

## 2018-05-22 DIAGNOSIS — G459 Transient cerebral ischemic attack, unspecified: Secondary | ICD-10-CM | POA: Diagnosis not present

## 2018-05-22 LAB — COMPREHENSIVE METABOLIC PANEL
ALT: 33 U/L (ref 14–54)
AST: 34 U/L (ref 15–41)
Albumin: 4.1 g/dL (ref 3.5–5.0)
Alkaline Phosphatase: 71 U/L (ref 38–126)
Anion gap: 9 (ref 5–15)
BILIRUBIN TOTAL: 0.9 mg/dL (ref 0.3–1.2)
BUN: 17 mg/dL (ref 6–20)
CO2: 28 mmol/L (ref 22–32)
Calcium: 9 mg/dL (ref 8.9–10.3)
Chloride: 101 mmol/L (ref 101–111)
Creatinine, Ser: 0.47 mg/dL (ref 0.44–1.00)
GFR calc Af Amer: >60 mL/min (ref >60)
Glucose, Bld: 108 mg/dL — ABNORMAL HIGH (ref 65–99)
POTASSIUM: 4 mmol/L (ref 3.5–5.1)
Sodium: 138 mmol/L (ref 135–145)
TOTAL PROTEIN: 6.8 g/dL (ref 6.5–8.1)

## 2018-05-22 LAB — CBC
HEMATOCRIT: 39.7 % (ref 35.0–47.0)
HEMOGLOBIN: 13.9 g/dL (ref 12.0–16.0)
MCH: 34.6 pg — ABNORMAL HIGH (ref 26.0–34.0)
MCHC: 34.9 g/dL (ref 32.0–36.0)
MCV: 99.4 fL (ref 80.0–100.0)
Platelets: 327 10*3/uL (ref 150–440)
RBC: 4 MIL/uL (ref 3.80–5.20)
RDW: 13 % (ref 11.5–14.5)
WBC: 5.8 10*3/uL (ref 3.6–11.0)

## 2018-05-22 LAB — DIFFERENTIAL
BASOS ABS: 0.1 10*3/uL (ref 0–0.1)
Basophils Relative: 2 %
EOS ABS: 0.3 10*3/uL (ref 0–0.7)
EOS PCT: 6 %
LYMPHS ABS: 1.5 10*3/uL (ref 1.0–3.6)
Lymphocytes Relative: 26 %
MONOS PCT: 12 %
Monocytes Absolute: 0.7 10*3/uL (ref 0.2–0.9)
Neutro Abs: 3.2 10*3/uL (ref 1.4–6.5)
Neutrophils Relative %: 54 %

## 2018-05-22 LAB — GLUCOSE, CAPILLARY: GLUCOSE-CAPILLARY: 110 mg/dL — AB (ref 65–99)

## 2018-05-22 LAB — APTT: aPTT: 32 seconds (ref 24–36)

## 2018-05-22 LAB — PROTIME-INR
INR: 0.89
Prothrombin Time: 12 seconds (ref 11.4–15.2)

## 2018-05-22 LAB — TROPONIN I

## 2018-05-22 MED ORDER — ALTEPLASE 100 MG IV SOLR
INTRAVENOUS | Status: AC
  Filled 2018-05-22: qty 100

## 2018-05-22 MED ORDER — ENOXAPARIN SODIUM 40 MG/0.4ML ~~LOC~~ SOLN
40.0000 mg | SUBCUTANEOUS | Status: DC
  Administered 2018-05-22: 22:00:00 40 mg via SUBCUTANEOUS
  Filled 2018-05-22: qty 0.4

## 2018-05-22 MED ORDER — ADULT MULTIVITAMIN W/MINERALS CH
1.0000 | ORAL_TABLET | Freq: Every day | ORAL | Status: DC
  Administered 2018-05-23: 1 via ORAL
  Filled 2018-05-22: qty 1

## 2018-05-22 MED ORDER — GLUCOSAMINE 500 MG PO CAPS: 500.0000 mg | ORAL_CAPSULE | Freq: Every day | ORAL | Status: DC

## 2018-05-22 MED ORDER — ATORVASTATIN CALCIUM 20 MG PO TABS
10.0000 mg | ORAL_TABLET | Freq: Every day | ORAL | Status: DC
  Administered 2018-05-22: 10 mg via ORAL
  Filled 2018-05-22: qty 1

## 2018-05-22 MED ORDER — HYDRALAZINE HCL 20 MG/ML IJ SOLN
10.0000 mg | Freq: Four times a day (QID) | INTRAMUSCULAR | Status: DC | PRN
  Filled 2018-05-22: qty 1

## 2018-05-22 MED ORDER — SODIUM CHLORIDE 0.9 % IV SOLN: INTRAVENOUS | Status: DC

## 2018-05-22 MED ORDER — ASPIRIN 300 MG RE SUPP: 300.0000 mg | Freq: Every day | RECTAL | Status: DC

## 2018-05-22 MED ORDER — LABETALOL HCL 5 MG/ML IV SOLN
INTRAVENOUS | Status: AC
  Filled 2018-05-22: qty 4

## 2018-05-22 MED ORDER — PROPRANOLOL HCL ER 60 MG PO CP24
60.0000 mg | ORAL_CAPSULE | Freq: Every day | ORAL | Status: DC
  Administered 2018-05-23: 60 mg via ORAL
  Filled 2018-05-22: qty 1

## 2018-05-22 MED ORDER — ASPIRIN 81 MG PO CHEW
324.0000 mg | CHEWABLE_TABLET | Freq: Once | ORAL | Status: AC
  Administered 2018-05-22: 324 mg via ORAL
  Filled 2018-05-22: qty 4

## 2018-05-22 MED ORDER — MECLIZINE HCL 25 MG PO TABS
25.0000 mg | ORAL_TABLET | Freq: Two times a day (BID) | ORAL | Status: DC | PRN
Start: 2018-05-22 — End: 2018-05-23
  Filled 2018-05-22: qty 1

## 2018-05-22 MED ORDER — ACETAMINOPHEN 650 MG RE SUPP: 650.0000 mg | RECTAL | Status: DC | PRN

## 2018-05-22 MED ORDER — ASPIRIN 325 MG PO TABS
325.0000 mg | ORAL_TABLET | Freq: Every day | ORAL | Status: DC
  Administered 2018-05-23: 10:00:00 325 mg via ORAL
  Filled 2018-05-22 (×2): qty 1

## 2018-05-22 MED ORDER — HYDROCHLOROTHIAZIDE 12.5 MG PO CAPS
12.5000 mg | ORAL_CAPSULE | Freq: Every day | ORAL | Status: DC
  Filled 2018-05-22: qty 1

## 2018-05-22 MED ORDER — STROKE: EARLY STAGES OF RECOVERY BOOK
Freq: Once | Status: AC
  Administered 2018-05-22: 17:00:00

## 2018-05-22 MED ORDER — VALSARTAN-HYDROCHLOROTHIAZIDE 160-12.5 MG PO TABS: 1.0000 | ORAL_TABLET | Freq: Every day | ORAL | Status: DC

## 2018-05-22 MED ORDER — IOPAMIDOL (ISOVUE-370) INJECTION 76%
75.0000 mL | Freq: Once | INTRAVENOUS | Status: AC | PRN
  Administered 2018-05-22: 75 mL via INTRAVENOUS

## 2018-05-22 MED ORDER — IRBESARTAN 150 MG PO TABS
150.0000 mg | ORAL_TABLET | Freq: Every day | ORAL | Status: DC
  Administered 2018-05-23: 10:00:00 150 mg via ORAL
  Filled 2018-05-22: qty 1

## 2018-05-22 MED ORDER — ACETAMINOPHEN 325 MG PO TABS: 650.0000 mg | ORAL_TABLET | ORAL | Status: DC | PRN

## 2018-05-22 MED ORDER — CYCLOSPORINE 0.05 % OP EMUL
1.0000 [drp] | Freq: Two times a day (BID) | OPHTHALMIC | Status: DC
Start: 2018-05-22 — End: 2018-05-23
  Administered 2018-05-23: 10:00:00 1 [drp] via OPHTHALMIC
  Filled 2018-05-22 (×3): qty 1

## 2018-05-22 MED ORDER — ACETAMINOPHEN 160 MG/5ML PO SOLN
650.0000 mg | ORAL | Status: DC | PRN
  Filled 2018-05-22: qty 20.3

## 2018-05-22 MED ORDER — LORAZEPAM 1 MG PO TABS
1.0000 mg | ORAL_TABLET | Freq: Once | ORAL | Status: AC
  Administered 2018-05-22: 1 mg via ORAL
  Filled 2018-05-22: qty 1

## 2018-05-22 NOTE — Progress Notes (Signed)
PHARMACIST - PHYSICIAN ORDER COMMUNICATION  CONCERNING: P&T Medication Policy on Herbal Medications  DESCRIPTION:  This patient's order for: Glucosamine  has been noted.  This product(s) is classified as an "herbal" or natural product. Due to a lack of definitive safety studies or FDA approval, nonstandard manufacturing practices, plus the potential risk of unknown drug-drug interactions while on inpatient medications, the Pharmacy and Therapeutics Committee does not permit the use of "herbal" or natural products of this type within [facility].   ACTION TAKEN: The pharmacy department is unable to verify this order at this time and your patient has been informed of this safety policy. Please reevaluate patient's clinical condition at discharge and address if the herbal or natural product(s) should be resumed at that time.  

## 2018-05-22 NOTE — Plan of Care (Signed)
Pt admitted from the ED. VSS. Denies pain. NIH 0. Diplopia improved per pt.

## 2018-05-22 NOTE — Care Management Obs Status (Signed)
MEDICARE OBSERVATION STATUS NOTIFICATION   Patient Details  Name: Amy Gilmore MRN: [id number] Date of Birth: [birth year]   Medicare Observation Status Notification Given:  Yes    Heyli Min A Magalie Almon, RN 05/22/2018, 1:15 PM

## 2018-05-22 NOTE — ED Notes (Signed)
Pt ambulated to restroom with steady gait.

## 2018-05-22 NOTE — ED Triage Notes (Signed)
Pt c/o sudden onset double vision about 1 hr PTA. Pt states normal with just looking through one eye at a time but with both she sees double. Pt denies any pain or other sx at present.

## 2018-05-22 NOTE — H&P (Signed)
[facility] NAME: Amy Gilmore    MR#:  [id number]  DATE OF BIRTH:  Oct 02, 1936  DATE OF ADMISSION:  05/22/2018  PRIMARY CARE PHYSICIAN: Lavera Guise, MD   REQUESTING/REFERRING PHYSICIAN:   CHIEF COMPLAINT:   Chief Complaint  Patient presents with  . Diplopia    HISTORY OF PRESENT ILLNESS: Amy Gilmore  is a 82 y.o. female with a known history of nephrolithiasis, hyperlipidemia, hypertension, mitral valve prolapse presented to the emergency room for double vision and dizziness.  Patient woke up this morning and had double vision.  No history of fall, head injury.  No complaints of any seizures.  No history of facial droop, dysphagia.  No tingling or numbness in any part of the body.  Patient was worked up with CT and neck which showed no acute abnormality hospitalist service was consulted.. CT scan of the head was normal .Case was discussed with the neurologist by ER physician.  TPA was recommended by neurologist but patient and family deferred and did not want any TPA therapy.  PAST MEDICAL HISTORY:   Past Medical History:  Diagnosis Date  . Adenomatous colon polyp 07/03/2012  . Diverticulosis   . Frequency of urination   . History of kidney stones   . Hyperlipidemia   . Hypertension   . Internal hemorrhoids   . Mitral valve prolapse per dr wall note 09/12--  stable  . PAC (premature atrial contraction) followed by cardiologist --  dr wall--  last visit  sept 12  note in epic--  states stable  . Palpitations hx pac's-   occasional  . Renal calculus, right followed by dr Karsten Ro   February 2013  . Right shoulder pain Dec 07, 2011 fall   pain at times with positioning  . Urgency of micturation   . Vertigo, intermittent     PAST SURGICAL HISTORY:  Past Surgical History:  Procedure Laterality Date  . APPENDECTOMY  1955  . BUNIONECTOMY  1995   and osteotomy  . COLONOSCOPY  2017  . CYSTOSCOPY   02/22/2012   Procedure: CYSTOSCOPY;  Surgeon: Claybon Jabs, MD;  Location: [facility];  Service: Urology;  Laterality: Right;  removal of ureteral stent  . FLEXIBLE SIGMOIDOSCOPY  08/12/2012   Procedure: FLEXIBLE SIGMOIDOSCOPY;  Surgeon: Inda Castle, MD;  Location: [facility];  Service: Endoscopy;  Laterality: N/A;  . NEPHROLITHOTOMY  01/28/2012-  right ureteral stent placement   Procedure: NEPHROLITHOTOMY PERCUTANEOUS;  Surgeon: Claybon Jabs, MD;  Location: [facility];  Service: Urology;  Laterality: Right;  . OTHER SURGICAL HISTORY  01/28/12   right percutaneous nephroliathothoma  . REFRACTIVE SURGERY  yrs ago  . [place] and 2006   right  . URETEROSCOPY  02/22/2012   Procedure: URETEROSCOPY;  Surgeon: Claybon Jabs, MD;  Location: [facility];  Service: Urology;  Laterality: Right;  URETEROSCOPY WITH STONE EXTRACTION   . VAGINAL HYSTERECTOMY  1985    SOCIAL HISTORY:  Social History   Tobacco Use  . Smoking status: Former Smoker    Packs/day: 1.00    Years: 15.00    Pack years: 15.00    Last attempt to quit: 12/04/1975    Years since quitting: 42.4  . Smokeless tobacco: Never Used  Substance Use Topics  . Alcohol use: Yes    Alcohol/week: 4.2 oz    Types: 7 Glasses of wine per week  Comment: Socially    FAMILY HISTORY:  Family History  Problem Relation Age of Onset  . Liver cancer Mother   . Heart attack Sister   . Peripheral vascular disease Sister   . Colon cancer Neg Hx     DRUG ALLERGIES:  Allergies  Allergen Reactions  . Codeine Nausea Only  . Sulfa Antibiotics Rash    REVIEW OF SYSTEMS:   CONSTITUTIONAL: No fever, fatigue or weakness.  EYES: No blurred or double vision.  EARS, NOSE, AND THROAT: No tinnitus or ear pain.  RESPIRATORY: No cough, shortness of breath, wheezing or hemoptysis.  CARDIOVASCULAR: No chest pain, orthopnea, edema.  GASTROINTESTINAL: No nausea, vomiting, diarrhea or abdominal pain.   GENITOURINARY: No dysuria, hematuria.  ENDOCRINE: No polyuria, nocturia,  HEMATOLOGY: No anemia, easy bruising or bleeding SKIN: No rash or lesion. MUSCULOSKELETAL: No joint pain or arthritis.   NEUROLOGIC: No tingling, numbness, weakness.  Has dizziness Has double vision PSYCHIATRY: No anxiety or depression.   MEDICATIONS AT HOME:  Prior to Admission medications   Medication Sig Start Date End Date Taking? Authorizing Provider  acetaminophen (TYLENOL) 500 MG tablet Take 1,000 mg by mouth every 6 (six) hours as needed. Pain    Yes [provider]  aspirin 81 MG tablet Take 81 mg by mouth daily.   Yes [provider]  atorvastatin (LIPITOR) 10 MG tablet Take 1 tablet (10 mg total) by mouth at bedtime. 03/19/18  Yes Lavera Guise, MD  Glucosamine 500 MG CAPS Take 500 mg by mouth daily.    Yes [provider]  Hypromellose (GENTEAL) 0.3 % SOLN Place 1-2 drops into both eyes 2 (two) times daily as needed. Dry eyes    Yes [provider]  meclizine (ANTIVERT) 25 MG tablet Take 25 mg by mouth as needed. Inner ear 08/13/11  Yes [provider]  meloxicam (MOBIC) 15 MG tablet TAKE 1 TABLET BY MOUTH EVERY DAY AFTER MEALS 04/30/18  Yes Lavera Guise, MD  Multiple Vitamins-Minerals (CENTRUM SILVER) tablet Take 1 tablet by mouth daily.    Yes [provider]  propranolol (INDERAL LA) 60 MG 24 hr capsule Take 60 mg by mouth daily before breakfast.    Yes [provider]  RESTASIS 0.05 % ophthalmic emulsion INSTILL 1 DROP INTO BOTH EYES TWICE A DAY 01/19/18  Yes [provider]  valsartan-hydrochlorothiazide (DIOVAN-HCT) 160-12.5 MG tablet Take 1 tablet by mouth daily. 03/19/18  Yes Lavera Guise, MD  hydrALAZINE (APRESOLINE) 10 MG tablet Take 1 tablet (10 mg total) by mouth 3 (three) times daily. Patient not taking: Reported on 05/22/2018 11/14/17   Lavera Guise, MD      PHYSICAL EXAMINATION:   VITAL SIGNS: Blood pressure (!)  164/124, pulse (!) 53, temperature 98 F (36.7 C), temperature source Oral, resp. rate 16, height 5\' 2"  (1.575 m), weight 56.7 kg (125 lb), SpO2 100 %.  GENERAL:  82 y.o.-year-old patient lying in the bed with no acute distress.  EYES: Pupils equal, round, reactive to light and accommodation. No scleral icterus. Extraocular muscles intact.  HEENT: Head atraumatic, normocephalic. Oropharynx and nasopharynx clear.  Has double vision NECK:  Supple, no jugular venous distention. No thyroid enlargement, no tenderness.  LUNGS: Normal breath sounds bilaterally, no wheezing, rales,rhonchi or crepitation. No use of accessory muscles of respiration.  CARDIOVASCULAR: S1, S2 normal. No murmurs, rubs, or gallops.  ABDOMEN: Soft, nontender, nondistended. Bowel sounds present. No organomegaly or mass.  EXTREMITIES: No pedal edema, cyanosis, or  clubbing.  NEUROLOGIC: Cranial nerves II through XII are intact. Muscle strength 5/5 in all extremities. Sensation intact. Gait not checked.  PSYCHIATRIC: The patient is alert and oriented x 3.  SKIN: No obvious rash, lesion, or ulcer.   LABORATORY PANEL:   CBC Recent Labs  Lab 05/22/18 1015  WBC 5.8  HGB 13.9  HCT 39.7  PLT 327  MCV 99.4  MCH 34.6*  MCHC 34.9  RDW 13.0  LYMPHSABS 1.5  MONOABS 0.7  EOSABS 0.3  BASOSABS 0.1   ------------------------------------------------------------------------------------------------------------------  Chemistries  Recent Labs  Lab 05/22/18 1015  NA 138  K 4.0  CL 101  CO2 28  GLUCOSE 108*  BUN 17  CREATININE 0.47  CALCIUM 9.0  AST 34  ALT 33  ALKPHOS 71  BILITOT 0.9   ------------------------------------------------------------------------------------------------------------------ estimated creatinine clearance is 44.4 mL/min (by C-G formula based on SCr of 0.47 mg/dL). ------------------------------------------------------------------------------------------------------------------ No results for  input(s): TSH, T4TOTAL, T3FREE, THYROIDAB in the last 72 hours.  Invalid input(s): FREET3   Coagulation profile Recent Labs  Lab 05/22/18 1015  INR 0.89   ------------------------------------------------------------------------------------------------------------------- No results for input(s): DDIMER in the last 72 hours. -------------------------------------------------------------------------------------------------------------------  Cardiac Enzymes Recent Labs  Lab 05/22/18 1015  TROPONINI <0.03   ------------------------------------------------------------------------------------------------------------------ Invalid input(s): POCBNP  ---------------------------------------------------------------------------------------------------------------  Urinalysis No results found for: COLORURINE, APPEARANCEUR, LABSPEC, PHURINE, GLUCOSEU, HGBUR, BILIRUBINUR, KETONESUR, PROTEINUR, UROBILINOGEN, NITRITE, LEUKOCYTESUR   RADIOLOGY: Ct Angio Head W Or Wo Contrast  Result Date: 05/22/2018 CLINICAL DATA:  Stroke follow-up EXAM: CT ANGIOGRAPHY HEAD AND NECK TECHNIQUE: Multidetector CT imaging of the head and neck was performed using the standard protocol during bolus administration of intravenous contrast. Multiplanar CT image reconstructions and MIPs were obtained to evaluate the vascular anatomy. Carotid stenosis measurements (when applicable) are obtained utilizing NASCET criteria, using the distal internal carotid diameter as the denominator. CONTRAST:  28mL ISOVUE-370 IOPAMIDOL (ISOVUE-370) INJECTION 76% COMPARISON:  Noncontrast head CT from earlier today FINDINGS: CTA NECK FINDINGS Aortic arch: Minimal atheromatous changes. Two vessel branching. Negative for dissection. Right carotid system: Limited atherosclerosis. No stenosis, ulceration, dissection, or beading. Left carotid system: Tortuosity without atheromatous change, stenosis, or dissection. Vertebral arteries: Mild proximal  subclavian atherosclerosis on the left. Left dominant vertebral artery that is tortuous at the V2 and V3 segment, projecting into the spinal canal at one point. Right vertebral artery is also tortuous. Both vessels are smooth and widely patent to the dura. Skeleton: No acute or aggressive finding. Advanced and erosive arthropathy at the C1-2 facets and atlantodental interval. No chart history of rheumatoid arthritis. No notable soft tissue calcification. There is ligamentous thickening behind the dens without notable pannus and no cervicomedullary contact. Other neck: No acute or aggressive finding. Upper chest: Negative Review of the MIP images confirms the above findings CTA HEAD FINDINGS Anterior circulation: Mild atheromatous changes. No branch occlusion, beading, or aneurysm. Hypoplastic right A1 segment Posterior circulation: Strong left vertebral artery dominance. Minimal contribution to the basilar by the right vertebral artery. Robust flow in vertebrobasilar branches. Negative for aneurysm. Venous sinuses: Patent Anatomic variants: As above Delayed phase: No abnormal intracranial enhancement Review of the MIP images confirms the above findings IMPRESSION: 1. No emergent large vessel occlusion. 2. Mild atherosclerosis for age.  No flow limiting stenosis. Electronically Signed   By: Monte Fantasia M.D.   On: 05/22/2018 11:03   Ct Angio Neck W Or Wo Contrast  Result Date: 05/22/2018 CLINICAL DATA:  Stroke follow-up EXAM: CT ANGIOGRAPHY HEAD AND NECK TECHNIQUE: Multidetector CT imaging  of the head and neck was performed using the standard protocol during bolus administration of intravenous contrast. Multiplanar CT image reconstructions and MIPs were obtained to evaluate the vascular anatomy. Carotid stenosis measurements (when applicable) are obtained utilizing NASCET criteria, using the distal internal carotid diameter as the denominator. CONTRAST:  72mL ISOVUE-370 IOPAMIDOL (ISOVUE-370) INJECTION 76%  COMPARISON:  Noncontrast head CT from earlier today FINDINGS: CTA NECK FINDINGS Aortic arch: Minimal atheromatous changes. Two vessel branching. Negative for dissection. Right carotid system: Limited atherosclerosis. No stenosis, ulceration, dissection, or beading. Left carotid system: Tortuosity without atheromatous change, stenosis, or dissection. Vertebral arteries: Mild proximal subclavian atherosclerosis on the left. Left dominant vertebral artery that is tortuous at the V2 and V3 segment, projecting into the spinal canal at one point. Right vertebral artery is also tortuous. Both vessels are smooth and widely patent to the dura. Skeleton: No acute or aggressive finding. Advanced and erosive arthropathy at the C1-2 facets and atlantodental interval. No chart history of rheumatoid arthritis. No notable soft tissue calcification. There is ligamentous thickening behind the dens without notable pannus and no cervicomedullary contact. Other neck: No acute or aggressive finding. Upper chest: Negative Review of the MIP images confirms the above findings CTA HEAD FINDINGS Anterior circulation: Mild atheromatous changes. No branch occlusion, beading, or aneurysm. Hypoplastic right A1 segment Posterior circulation: Strong left vertebral artery dominance. Minimal contribution to the basilar by the right vertebral artery. Robust flow in vertebrobasilar branches. Negative for aneurysm. Venous sinuses: Patent Anatomic variants: As above Delayed phase: No abnormal intracranial enhancement Review of the MIP images confirms the above findings IMPRESSION: 1. No emergent large vessel occlusion. 2. Mild atherosclerosis for age.  No flow limiting stenosis. Electronically Signed   By: Monte Fantasia M.D.   On: 05/22/2018 11:03   Ct Head Code Stroke Wo Contrast  Result Date: 05/22/2018 CLINICAL DATA:  Code stroke.  Sudden onset of diplopia. EXAM: CT HEAD WITHOUT CONTRAST TECHNIQUE: Contiguous axial images were obtained from the  base of the skull through the vertex without intravenous contrast. COMPARISON:  None. FINDINGS: Brain: Mild atrophy and white matter changes are within normal limits for age. No acute infarct, hemorrhage, or mass lesion is present. The basal ganglia are intact. Insular ribbon is normal bilaterally. Brainstem and cerebellum are normal. The sella is within normal limits. Optic chiasm unremarkable. Vascular: Atherosclerotic calcifications are present within the cavernous internal carotid arteries bilaterally. There is no hyperdense vessel associated. Skull: Calvarium is intact. No focal lytic or blastic lesions are present. No significant extra-axial fluid collections are present. Sinuses/Orbits: The paranasal sinuses and mastoid air cells are clear. Globes and orbits are within normal limits. ASPECTS [facility] Stroke Program Early CT Score) - Ganglionic level infarction (caudate, lentiform nuclei, internal capsule, insula, M1-M3 cortex): 7/7 - Supraganglionic infarction (M4-M6 cortex): 3/3 Total score (0-10 with 10 being normal): 10/10 IMPRESSION: 1. Normal CT appearance of the brain for age. No acute or focal lesion to explain diplopia. 2. ASPECTS is 10/10 These results were called by telephone at the time of interpretation on 05/22/2018 at 10:22 am to Dr. Lenise Arena , who verbally acknowledged these results. Electronically Signed   By: San Morelle M.D.   On: 05/22/2018 10:23    EKG: Orders placed or performed during the hospital encounter of 05/22/18  . ED EKG  . ED EKG  . EKG 12-Lead  . EKG 12-Lead    IMPRESSION AND PLAN:  82 year old female patient with history of hyperlipidemia, hypertension, mitral valve prolapse, nephrolithiasis  presented to the emergency room with double vision, dizziness.  -Diplopia Assess for any posterior cerebellar lesion Check for MRI brain MRA brain Check carotid ultrasound and echocardiogram Neurology consultation  -Dizziness Assess for any cerebellar  pathology  -Hyperlipidemia Continue statin medication  -DVT prophylaxis with subcu Lovenox daily  -Admit under observation bed  All the records are reviewed and case discussed with ED provider. Management plans discussed with the patient, family and they are in agreement.  CODE STATUS:Full code    TOTAL TIME TAKING CARE OF THIS PATIENT: 51 minutes.    Saundra Shelling M.D on 05/22/2018 at 12:54 PM  Between 7am to 6pm - Pager - [phone]  After 6pm go to [email or web address] - password [email or web address] [facility]  [facility]  Office  [phone]  CC: Primary care physician; Lavera Guise, MD

## 2018-05-22 NOTE — Progress Notes (Signed)
   05/22/18 1005  Clinical Encounter Type  Visited With Patient and family together  Visit Type Initial;ED  Referral From Nurse  Consult/Referral To Chaplain  Spiritual Encounters  Spiritual Needs Emotional;Other (Comment)   [place] received a PG for a Code Stroke. CH reported to PT's RM as care team was discussing care option with PT and husband. PT was taken to CT and husband thanked [facility] for coming but he did not need a CH at this time. CH told the PT that he would be available if needed.

## 2018-05-22 NOTE — ED Provider Notes (Signed)
[facility] Emergency Department Provider Note       Time seen: ----------------------------------------- 10:22 AM on 05/22/2018 -----------------------------------------   I have reviewed the triage vital signs and the nursing notes.  HISTORY   Chief Complaint Diplopia    HPI Amy Gilmore is a 82 y.o. female with a history of kidney stones, hypertension, hyperlipidemia, palpitations who presents to the ED for sudden onset of double vision that occurred 1 hour prior to arrival.  Patient states if she closes one eye everything looks normal but when she looks to both eyes she has double vision.  She denies any pain or any other neurologic symptoms.  She is never had these symptoms before.  She denies fevers, chills or other complaints.  Past Medical History:  Diagnosis Date  . Adenomatous colon polyp 07/03/2012  . Diverticulosis   . Frequency of urination   . History of kidney stones   . Hyperlipidemia   . Hypertension   . Internal hemorrhoids   . Mitral valve prolapse per dr wall note 09/12--  stable  . PAC (premature atrial contraction) followed by cardiologist --  dr wall--  last visit  sept 12  note in epic--  states stable  . Palpitations hx pac's-   occasional  . Renal calculus, right followed by dr Karsten Ro   February 2013  . Right shoulder pain Dec 07, 2011 fall   pain at times with positioning  . Urgency of micturation   . Vertigo, intermittent     Patient Active Problem List   Diagnosis Date Noted  . Mitral valve insufficiency 12/06/2016  . Chest pain 02/28/2015  . Periumbilical abdominal pain 02/15/2015  . Facial aging 01/21/2013  . Personal history of colonic polyps 08/06/2012  . Internal hemorrhoids 07/02/2012  . Hyperlipidemia 07/31/2010  . HTN (hypertension) 07/31/2010  . PALPITATIONS 07/31/2010  . Mitral valve prolapse 07/31/2010    Past Surgical History:  Procedure Laterality Date  . APPENDECTOMY  1955  .  BUNIONECTOMY  1995   and osteotomy  . COLONOSCOPY  2017  . CYSTOSCOPY  02/22/2012   Procedure: CYSTOSCOPY;  Surgeon: Claybon Jabs, MD;  Location: [facility];  Service: Urology;  Laterality: Right;  removal of ureteral stent  . FLEXIBLE SIGMOIDOSCOPY  08/12/2012   Procedure: FLEXIBLE SIGMOIDOSCOPY;  Surgeon: Inda Castle, MD;  Location: [facility];  Service: Endoscopy;  Laterality: N/A;  . NEPHROLITHOTOMY  01/28/2012-  right ureteral stent placement   Procedure: NEPHROLITHOTOMY PERCUTANEOUS;  Surgeon: Claybon Jabs, MD;  Location: [facility];  Service: Urology;  Laterality: Right;  . OTHER SURGICAL HISTORY  01/28/12   right percutaneous nephroliathothoma  . REFRACTIVE SURGERY  yrs ago  . [place] and 2006   right  . URETEROSCOPY  02/22/2012   Procedure: URETEROSCOPY;  Surgeon: Claybon Jabs, MD;  Location: [facility];  Service: Urology;  Laterality: Right;  URETEROSCOPY WITH STONE EXTRACTION   . VAGINAL HYSTERECTOMY  1985    Allergies Codeine and Sulfa antibiotics  Social History Social History   Tobacco Use  . Smoking status: Former Smoker    Packs/day: 1.00    Years: 15.00    Pack years: 15.00    Last attempt to quit: 12/04/1975    Years since quitting: 42.4  . Smokeless tobacco: Never Used  Substance Use Topics  . Alcohol use: Yes    Alcohol/week: 4.2 oz    Types: 7 Glasses of wine per week  Comment: Socially  . Drug use: No   Review of Systems Constitutional: Negative for fever. Eyes: Positive for horizontal diplopia ENT:  Negative for congestion, sore throat Cardiovascular: Negative for chest pain. Respiratory: Negative for shortness of breath. Gastrointestinal: Negative for abdominal pain, vomiting and diarrhea. Musculoskeletal: Negative for back pain. Skin: Negative for rash. Neurological: Negative for headaches, focal weakness or numbness.  Positive for vision changes  All systems negative/normal/unremarkable  except as stated in the HPI ____________________________________________   PHYSICAL EXAM:  VITAL SIGNS: ED Triage Vitals  Enc Vitals Group     BP 05/22/18 0957 (!) 194/93     Pulse Rate 05/22/18 0957 (!) 58     Resp 05/22/18 0957 17     Temp 05/22/18 0957 98 F (36.7 C)     Temp Source 05/22/18 0957 Oral     SpO2 05/22/18 0957 100 %     Weight 05/22/18 0958 120 lb (54.4 kg)     Height 05/22/18 0958 5\' 2"  (1.575 m)     Head Circumference --      Peak Flow --      Pain Score --      Pain Loc --      Pain Edu? --      Excl. in [place]? --    Constitutional: Alert and oriented. Well appearing and in no distress. Eyes: Conjunctivae are normal. Normal extraocular movements. ENT   Head: Normocephalic and atraumatic.   Nose: No congestion/rhinnorhea.   Mouth/Throat: Mucous membranes are moist.   Neck: No stridor. Cardiovascular: Normal rate, regular rhythm. No murmurs, rubs, or gallops. Respiratory: Normal respiratory effort without tachypnea nor retractions. Breath sounds are clear and equal bilaterally. No wheezes/rales/rhonchi. Gastrointestinal: Soft and nontender. Normal bowel sounds Musculoskeletal: Nontender with normal range of motion in extremities. No lower extremity tenderness nor edema. Neurologic:  Normal speech and language. No gross focal neurologic deficits are appreciated.  Skin:  Skin is warm, dry and intact. No rash noted. Psychiatric: Mood and affect are normal. Speech and behavior are normal.  ____________________________________________  EKG: Interpreted by me.  Sinus rhythm rate of 59 bpm, normal PR interval, normal QRS, normal QT.  ____________________________________________  ED COURSE:  As part of my medical decision making, I reviewed the following data within the [place] History obtained from family if available, nursing notes, old chart and ekg, as well as notes from prior ED visits. Patient presented for diplopia, we will  assess with labs and imaging as indicated at this time.   Procedures ____________________________________________   LABS (pertinent positives/negatives)  Labs Reviewed  GLUCOSE, CAPILLARY - Abnormal; Notable for the following components:      Result Value   Glucose-Capillary 110 (*)    All other components within normal limits  CBC - Abnormal; Notable for the following components:   MCH 34.6 (*)    All other components within normal limits  COMPREHENSIVE METABOLIC PANEL - Abnormal; Notable for the following components:   Glucose, Bld 108 (*)    All other components within normal limits  PROTIME-INR  APTT  DIFFERENTIAL  TROPONIN I  CBG MONITORING, ED    RADIOLOGY  CT head was negative CT angiogram was subsequently ordered. IMPRESSION: 1. No emergent large vessel occlusion. 2. Mild atherosclerosis for age.  No flow limiting stenosis. ____________________________________________  DIFFERENTIAL DIAGNOSIS   CVA, cranial nerve palsy, myasthenia, abdominal plegic migraine  FINAL ASSESSMENT AND PLAN  Diplopia   Plan: The patient had presented for sudden onset double  vision 1 hour prior to arrival.  At this point patient has declined TPA.  Patient's labs are unremarkable. Patient's imaging is negative including CT head and CT angiogram.  Patient was under the care of neurology while in her stay in the ER.  She was given an adult aspirin and will be admitted for CVA work-up.   Laurence Aly, MD   Note: This note was generated in part or whole with voice recognition software. Voice recognition is usually quite accurate but there are transcription errors that can and very often do occur. I apologize for any typographical errors that were not detected and corrected.     Earleen Newport, MD 05/22/18 [phone]

## 2018-05-22 NOTE — Code Documentation (Signed)
Pt arrives POV with complaints of sudden onset of blurry vision while at home at 0900, code stroke activated in triage, pt cleared for CT by Dr. Jimmye Norman at 1, after non con head CT pt taken to room 11, husband at bedside, NIHSS 1- pt states the month is May, pt continues to state double vision, pt offered tPA by Dr. Doy Mince but pt refused at this time, pt taken back to CT for CTA, pt to remain in tPA window until 13:30, report off to [facility]

## 2018-05-22 NOTE — ED Notes (Signed)
Pt was assisted to her feet and pt was able to walk to the bathroom. Pt used the bathroom and then assisted back to her bed w/o incident.

## 2018-05-22 NOTE — ED Notes (Signed)
Patient transported to CT per this RN.

## 2018-05-22 NOTE — Progress Notes (Signed)
Advanced care plan. Purpose of the Encounter: CODE STATUS Parties in Attendance: Patient and family Patient's Decision Capacity: Good Subjective/Patient's story: Presented to the emergency room for dizziness and double vision Objective/Medical story Patient will be admitted for work-up for stroke Goals of care determination:  Advance care directives and goals of care discussed Plan of care discussed Patient wants everything done for now which includes cardiac resuscitation and intubation if the need arises CODE STATUS: Full code Time spent discussing advanced care planning: 16 minutes

## 2018-05-22 NOTE — Consult Note (Signed)
Referring Physician: Jimmye Norman    Chief Complaint: Diplopia  HPI: Amy Gilmore is an 82 y.o. female who awakened today in her usual state of health.  About 0900 noted acute onset of dizziness.  Then noted vertical diplopia.  With no resolution of symptoms presented for evaluation.  Initial NIHSS of 1.  Date last known well: Date: 05/22/2018 Time last known well: Time: 09:00 tPA Given: No: Patient refusal  Past Medical History:  Diagnosis Date  . Adenomatous colon polyp 07/03/2012  . Diverticulosis   . Frequency of urination   . History of kidney stones   . Hyperlipidemia   . Hypertension   . Internal hemorrhoids   . Mitral valve prolapse per dr wall note 09/12--  stable  . PAC (premature atrial contraction) followed by cardiologist --  dr wall--  last visit  sept 12  note in epic--  states stable  . Palpitations hx pac's-   occasional  . Renal calculus, right followed by dr Karsten Ro   February 2013  . Right shoulder pain Dec 07, 2011 fall   pain at times with positioning  . Urgency of micturation   . Vertigo, intermittent     Past Surgical History:  Procedure Laterality Date  . APPENDECTOMY  1955  . BUNIONECTOMY  1995   and osteotomy  . COLONOSCOPY  2017  . CYSTOSCOPY  02/22/2012   Procedure: CYSTOSCOPY;  Surgeon: Claybon Jabs, MD;  Location: [facility];  Service: Urology;  Laterality: Right;  removal of ureteral stent  . FLEXIBLE SIGMOIDOSCOPY  08/12/2012   Procedure: FLEXIBLE SIGMOIDOSCOPY;  Surgeon: Inda Castle, MD;  Location: [facility];  Service: Endoscopy;  Laterality: N/A;  . NEPHROLITHOTOMY  01/28/2012-  right ureteral stent placement   Procedure: NEPHROLITHOTOMY PERCUTANEOUS;  Surgeon: Claybon Jabs, MD;  Location: [facility];  Service: Urology;  Laterality: Right;  . OTHER SURGICAL HISTORY  01/28/12   right percutaneous nephroliathothoma  . REFRACTIVE SURGERY  yrs ago  . [place] and 2006   right  . URETEROSCOPY   02/22/2012   Procedure: URETEROSCOPY;  Surgeon: Claybon Jabs, MD;  Location: [facility];  Service: Urology;  Laterality: Right;  URETEROSCOPY WITH STONE EXTRACTION   . VAGINAL HYSTERECTOMY  1985    Family History  Problem Relation Age of Onset  . Liver cancer Mother   . Heart attack Sister   . Peripheral vascular disease Sister   . Colon cancer Neg Hx    Social History:  reports that she quit smoking about 42 years ago. She has a 15.00 pack-year smoking history. She has never used smokeless tobacco. She reports that she drinks about 4.2 oz of alcohol per week. She reports that she does not use drugs.  Allergies:  Allergies  Allergen Reactions  . Codeine Nausea Only  . Sulfa Antibiotics Rash    Medications: I have reviewed the patient's current medications. Prior to Admission:  Prior to Admission medications   Medication Sig Start Date End Date Taking? Authorizing Provider  acetaminophen (TYLENOL) 500 MG tablet Take 1,000 mg by mouth every 6 (six) hours as needed. Pain     [provider]  aspirin 81 MG tablet Take 81 mg by mouth daily.    [provider]  atorvastatin (LIPITOR) 10 MG tablet Take 1 tablet (10 mg total) by mouth at bedtime. 03/19/18   Lavera Guise, MD  Glucosamine 500 MG CAPS Take 500 mg by mouth daily.  [provider]  hydrALAZINE (APRESOLINE) 10 MG tablet Take 1 tablet (10 mg total) by mouth 3 (three) times daily. 11/14/17   Lavera Guise, MD  hydrocortisone (ANUSOL-HC) 25 MG suppository Place 1 suppository (25 mg total) rectally 2 (two) times daily as needed for hemorrhoids or itching. 06/20/16   Armbruster, Carlota Raspberry, MD  hyoscyamine (LEVSIN SL) 0.125 MG SL tablet Place 2 tablets (0.25 mg total) under the tongue every 4 (four) hours as needed. 06/20/16   Armbruster, Carlota Raspberry, MD  Hypromellose (GENTEAL) 0.3 % SOLN Place 1-2 drops into both eyes 2 (two) times daily as needed. Dry eyes     [provider]   meclizine (ANTIVERT) 25 MG tablet Take 25 mg by mouth as needed. Inner ear 08/13/11   [provider]  meloxicam (MOBIC) 15 MG tablet TAKE 1 TABLET BY MOUTH EVERY DAY AFTER MEALS 04/30/18   Lavera Guise, MD  meloxicam (MOBIC) 7.5 MG tablet Take 7.5 mg by mouth as needed.    [provider]  Multiple Vitamin (MULTI-VITAMINS) TABS Take by mouth.    [provider]  Multiple Vitamins-Minerals (CENTRUM SILVER) tablet Take 1 tablet by mouth daily.     [provider]  propranolol (INDERAL LA) 60 MG 24 hr capsule Take 60 mg by mouth daily before breakfast.     [provider]  RESTASIS 0.05 % ophthalmic emulsion INSTILL 1 DROP INTO BOTH EYES TWICE A DAY 01/19/18   [provider]  valsartan-hydrochlorothiazide (DIOVAN-HCT) 160-12.5 MG tablet Take 1 tablet by mouth daily. 03/19/18   Lavera Guise, MD     ROS: History obtained from the patient  General ROS: negative for - chills, fatigue, fever, night sweats, weight gain or weight loss Psychological ROS: negative for - behavioral disorder, hallucinations, memory difficulties, mood swings or suicidal ideation Ophthalmic ROS: as noted in HPI ENT ROS: negative for - epistaxis, nasal discharge, oral lesions, sore throat, tinnitus or vertigo Allergy and Immunology ROS: negative for - hives or itchy/watery eyes Hematological and Lymphatic ROS: negative for - bleeding problems, bruising or swollen lymph nodes Endocrine ROS: negative for - galactorrhea, hair pattern changes, polydipsia/polyuria or temperature intolerance Respiratory ROS: negative for - cough, hemoptysis, shortness of breath or wheezing Cardiovascular ROS: negative for - chest pain, dyspnea on exertion, edema or irregular heartbeat Gastrointestinal ROS: negative for - abdominal pain, diarrhea, hematemesis, nausea/vomiting or stool incontinence Genito-Urinary ROS: negative for - dysuria, hematuria, incontinence or urinary  frequency/urgency Musculoskeletal ROS: negative for - joint swelling or muscular weakness Neurological ROS: as noted in HPI Dermatological ROS: negative for rash and skin lesion changes  Physical Examination: Blood pressure (!) 152/86, pulse (!) 56, temperature 98 F (36.7 C), temperature source Oral, resp. rate 16, height 5\' 2"  (1.575 m), weight 56.7 kg (125 lb), SpO2 97 %.  HEENT-  Normocephalic, no lesions, without obvious abnormality.  Normal external eye and conjunctiva.  Normal TM's bilaterally.  Normal auditory canals and external ears. Normal external nose, mucus membranes and septum.  Normal pharynx. Cardiovascular- S1, S2 normal, pulses palpable throughout   Lungs- chest clear, no wheezing, rales, normal symmetric air entry Abdomen- soft, non-tender; bowel sounds normal; no masses,  no organomegaly Extremities- no edema Lymph-no adenopathy palpable Musculoskeletal-no joint tenderness, deformity or swelling Skin-warm and dry, no hyperpigmentation, vitiligo, or suspicious lesions  Neurological Examination   Mental Status: Alert, oriented, thought content appropriate.  Speech fluent without evidence of aphasia.  Able to follow 3 step commands without difficulty.  Cranial Nerves: II: Discs flat bilaterally; Visual fields grossly normal, pupils equal, round, reactive to light and accommodation III,IV, VI: ptosis not present, extra-ocular motions intact with some decreased downward excursion of the right eye V,VII: smile symmetric, facial light touch sensation normal bilaterally VIII: hearing normal bilaterally IX,X: gag reflex present XI: bilateral shoulder shrug XII: midline tongue extension Motor: Right : Upper extremity   5/5    Left:     Upper extremity   5/5  Lower extremity   5/5     Lower extremity   5/5 Tone and bulk:normal tone throughout; no atrophy noted Sensory: Pinprick and light touch intact throughout, bilaterally Deep Tendon Reflexes: 2+ and symmetric with absent  AJ's bilaterally Plantars: Right: mute   Left: mute Cerebellar: Normal finger-to-nose and normal heel-to-shin testing bilaterally Gait: ataxic    Laboratory Studies:  Basic Metabolic Panel: Recent Labs  Lab 05/22/18 1015  NA 138  K 4.0  CL 101  CO2 28  GLUCOSE 108*  BUN 17  CREATININE 0.47  CALCIUM 9.0    Liver Function Tests: Recent Labs  Lab 05/22/18 1015  AST 34  ALT 33  ALKPHOS 71  BILITOT 0.9  PROT 6.8  ALBUMIN 4.1   No results for input(s): LIPASE, AMYLASE in the last 168 hours. No results for input(s): AMMONIA in the last 168 hours.  CBC: Recent Labs  Lab 05/22/18 1015  WBC 5.8  NEUTROABS 3.2  HGB 13.9  HCT 39.7  MCV 99.4  PLT 327    Cardiac Enzymes: Recent Labs  Lab 05/22/18 1015  TROPONINI <0.03    BNP: Invalid input(s): POCBNP  CBG: Recent Labs  Lab 05/22/18 0958  GLUCAP 110*    Microbiology: Results for orders placed or performed during the hospital encounter of 01/28/12  Wound culture     Status: None   Collection Time: 01/28/12 11:42 AM  Result Value Ref Range Status   Specimen Description WOUND OTHER  Final   Special Requests URETER RIGHT/STAGHORN CALCULUS  Final   Gram Stain   Final    NO WBC SEEN NO SQUAMOUS EPITHELIAL CELLS SEEN NO ORGANISMS SEEN   Culture NO GROWTH 2 DAYS  Final   Report Status 01/30/2012 FINAL  Final    Coagulation Studies: Recent Labs    05/22/18 1015  LABPROT 12.0  INR 0.89    Urinalysis: No results for input(s): COLORURINE, LABSPEC, PHURINE, GLUCOSEU, HGBUR, BILIRUBINUR, KETONESUR, PROTEINUR, UROBILINOGEN, NITRITE, LEUKOCYTESUR in the last 168 hours.  Invalid input(s): APPERANCEUR  Lipid Panel: No results found for: CHOL, TRIG, HDL, CHOLHDL, VLDL, LDLCALC  HgbA1C: No results found for: HGBA1C  Urine Drug Screen:  No results found for: LABOPIA, COCAINSCRNUR, LABBENZ, AMPHETMU, THCU, LABBARB  Alcohol Level: No results for input(s): ETH in the last 168 hours.   Imaging: Ct Head  Code Stroke Wo Contrast  Result Date: 05/22/2018 CLINICAL DATA:  Code stroke.  Sudden onset of diplopia. EXAM: CT HEAD WITHOUT CONTRAST TECHNIQUE: Contiguous axial images were obtained from the base of the skull through the vertex without intravenous contrast. COMPARISON:  None. FINDINGS: Brain: Mild atrophy and white matter changes are within normal limits for age. No acute infarct, hemorrhage, or mass lesion is present. The basal ganglia are intact. Insular ribbon is normal bilaterally. Brainstem and cerebellum are normal. The sella is within normal limits. Optic chiasm unremarkable. Vascular: Atherosclerotic calcifications are present within the cavernous internal carotid arteries bilaterally. There is no hyperdense vessel associated. Skull: Calvarium is intact. No focal lytic or  blastic lesions are present. No significant extra-axial fluid collections are present. Sinuses/Orbits: The paranasal sinuses and mastoid air cells are clear. Globes and orbits are within normal limits. ASPECTS [facility] Stroke Program Early CT Score) - Ganglionic level infarction (caudate, lentiform nuclei, internal capsule, insula, M1-M3 cortex): 7/7 - Supraganglionic infarction (M4-M6 cortex): 3/3 Total score (0-10 with 10 being normal): 10/10 IMPRESSION: 1. Normal CT appearance of the brain for age. No acute or focal lesion to explain diplopia. 2. ASPECTS is 10/10 These results were called by telephone at the time of interpretation on 05/22/2018 at 10:22 am to Dr. Lenise Arena , who verbally acknowledged these results. Electronically Signed   By: San Morelle M.D.   On: 05/22/2018 10:23    Assessment: 82 y.o. female presenting with dizziness and diplopia.  Posterior circualtion infarct suspected.  Patient on ASA at home.  Contraindications for tPA reviewed.  Risks and benefits discussed.  Patient refusing tPA at this time.    Stroke Risk Factors - hyperlipidemia and hypertension  Plan: 1. CTA of the head and  neck 2. Prophylactic therapy-ASA may be given orally or rectally based on results of swallow screen 3. NPO until RN stroke swallow screen 4. Telemetry monitoring 5. Frequent neuro checks  Case discussed with Dr. Simon Rhein, MD Neurology [phone] 05/22/2018, 10:53 AM  Addendum: CTA reviewed and shows no evidence of large vessel occlusion.  Patient continues to refuse tPA.  To be admitted for stroke work up as outlines below.    1. HgbA1c, fasting lipid panel 2. MRI  of the brain without contrast 3. PT consult, OT consult, Speech consult 4. Echocardiogram 5. Prophylactic therapy-Antiplatelet med: Aspirin - dose ASA daily   Alexis Goodell, MD Neurology [phone]

## 2018-05-22 NOTE — Care Management Note (Signed)
Case Management Note  Patient Details  Name: Amy Gilmore MRN: [id number] Date of Birth: 25-Apr-1936  Subjective/Objective:        Patient admitted to [facility] under observation status for TIA . RNCM consulted on patient to provide MOON letter and complete assessment. Patient lives with spouse Sonia Side [phone]. Also, has support from daughters who are at bedside. Patient currently is able to complete all activities of daily living without issue and requires no DME. Still drives. PCP is Dr Humphrey Rolls. Utilizes [facility] and obtains medications without issue.           Action/Plan: RNCM to continue to follow for any needs.   Expected Discharge Date:                  Expected Discharge Plan:     In-House Referral:     Discharge planning Services     Post Acute Care Choice:    Choice offered to:     DME Arranged:    DME Agency:     HH Arranged:    HH Agency:     Status of Service:     If discussed at [employer], dates discussed:    Additional Comments:  Zondra Lawlor A Tahjae Clausing, RN 05/22/2018, 1:40 PM

## 2018-05-23 ENCOUNTER — Observation Stay: Payer: Medicare Other

## 2018-05-23 ENCOUNTER — Observation Stay (HOSPITAL_BASED_OUTPATIENT_CLINIC_OR_DEPARTMENT_OTHER)
Admit: 2018-05-23 | Discharge: 2018-05-23 | Disposition: A | Discharge status: Home / Self Care | Payer: Medicare Other | Attending: Internal Medicine | Admitting: Internal Medicine

## 2018-05-23 DIAGNOSIS — I10 Essential (primary) hypertension: Secondary | ICD-10-CM | POA: Diagnosis not present

## 2018-05-23 DIAGNOSIS — I340 Nonrheumatic mitral (valve) insufficiency: Secondary | ICD-10-CM | POA: Diagnosis not present

## 2018-05-23 DIAGNOSIS — G459 Transient cerebral ischemic attack, unspecified: Secondary | ICD-10-CM | POA: Diagnosis not present

## 2018-05-23 DIAGNOSIS — E785 Hyperlipidemia, unspecified: Secondary | ICD-10-CM | POA: Diagnosis not present

## 2018-05-23 DIAGNOSIS — H532 Diplopia: Secondary | ICD-10-CM | POA: Diagnosis not present

## 2018-05-23 DIAGNOSIS — R42 Dizziness and giddiness: Secondary | ICD-10-CM | POA: Diagnosis not present

## 2018-05-23 DIAGNOSIS — I6523 Occlusion and stenosis of bilateral carotid arteries: Secondary | ICD-10-CM | POA: Diagnosis not present

## 2018-05-23 LAB — HEMOGLOBIN A1C
Hgb A1c MFr Bld: 5.6 % (ref 4.8–5.6)
MEAN PLASMA GLUCOSE: 114.02 mg/dL

## 2018-05-23 LAB — ECHOCARDIOGRAM COMPLETE
HEIGHTINCHES: 62 in
WEIGHTICAEL: 2000 [oz_av]

## 2018-05-23 LAB — LIPID PANEL
CHOLESTEROL: 169 mg/dL (ref 0–200)
HDL: 59 mg/dL (ref >40)
LDL Cholesterol: 93 mg/dL (ref 0–99)
TRIGLYCERIDES: 83 mg/dL (ref <150)
Total CHOL/HDL Ratio: 2.9 RATIO
VLDL: 17 mg/dL (ref 0–40)

## 2018-05-23 LAB — C-REACTIVE PROTEIN

## 2018-05-23 LAB — SEDIMENTATION RATE: Sed Rate: 9 mm/hr (ref 0–30)

## 2018-05-23 LAB — TSH: TSH: 2.961 u[IU]/mL (ref 0.350–4.500)

## 2018-05-23 NOTE — Evaluation (Signed)
Physical Therapy Evaluation Patient Details Name: Amy Gilmore MRN: [id number] DOB: 16-Aug-1936 Today's Date: 05/23/2018   History of Present Illness  Pt is a 82 y/o F wh presented with double vision and dizziness.  CT scan of the head was normal.  TPA recommended but family and pt deferred. Pt's MRI brain was negative. Pt's PMH includes intermittent vertigo, R shoulder pain.     Clinical Impression  Pt admitted with above diagnosis. Pt currently with functional limitations due to the deficits listed below (see PT Problem List). Pt demonstrates difficulty with finger to nose testing LUE which she attributes to diplopia.  Pt initially reports that she experiences her double vision if she looks up and to the L. Toward the end of ambulation the pt says that her double vision comes and goes.   Pt instructed that when she does experience diplopia and is walking, that she should stop and "re-set" before continuing on to avoid fall, pt verbalized understanding.  Pt demonstrates balance impairments with testing and this PT recommended OPPT but pt declined.  Pt reports 1 fall in the past 6 months in March where she tripped going up the steps (holding onto items in both hands) and hit her forehead between her eyes with resultant brusing.  Pt did not seek medical attention and is curious if this could be contributing to her double vision (RN notified who will notify Neurologist).  Recommend OT Evaluation prior to D/C.   Pt will benefit from skilled PT to increase their independence and safety with mobility to allow discharge to the venue listed below.      Follow Up Recommendations Outpatient PT(To address balance impairments; however, pt declined)    Equipment Recommendations  None recommended by PT    Recommendations for Other Services OT consult(for strategies for diplopia)     Precautions / Restrictions Precautions Precautions: Fall Restrictions Weight Bearing Restrictions: No       Mobility  Bed Mobility               General bed mobility comments: Pt sitting in chair at start and end of session  Transfers Overall transfer level: Independent Equipment used: None             General transfer comment: No signs of instability, pt perfroms independently.   Ambulation/Gait Ambulation/Gait assistance: Supervision Gait Distance (Feet): 320 Feet Assistive device: None Gait Pattern/deviations: Step-through pattern     General Gait Details: Mild instability with higher level balance activities but no LOB.   Stairs            Wheelchair Mobility    Modified Rankin (Stroke Patients Only) Modified Rankin (Stroke Patients Only) Pre-Morbid Rankin Score: No symptoms Modified Rankin: No significant disability     Balance Overall balance assessment: Modified Independent                           High level balance activites: Direction changes;Turns;Sudden stops;Head turns High Level Balance Comments: Pt demonstrates mild instability with higher level balance activities but no LOB Standardized Balance Assessment Standardized Balance Assessment : Dynamic Gait Index   Dynamic Gait Index Level Surface: Normal Change in Gait Speed: Normal Gait with Horizontal Head Turns: Mild Impairment Gait with Vertical Head Turns: Mild Impairment Gait and Pivot Turn: Normal Step Over Obstacle: Normal Step Around Obstacles: Normal       Pertinent Vitals/Pain Pain Assessment: No/denies pain    Home Living Family/patient expects to be  discharged to:: Private residence Living Arrangements: Spouse/significant other Available Help at Discharge: Family;Available 24 hours/day Type of Home: House Home Access: Stairs to enter Entrance Stairs-Rails: None Entrance Stairs-Number of Steps: 2 Home Layout: Two level;Able to live on main level with bedroom/bathroom Home Equipment: None      Prior Function Level of Independence: Independent          Comments: Independent at baseline.  Pt reports 1 fall in the past 6 months in March where she tripped going up the steps (holding onto items in both hands) and hit her forehead between her eyes with resultant brusing.  Pt did not seek medical attention and is curious if this could be contributing to her double vision (RN notified who will notify Neurologist).  Pt indepenent with all ADLs and IADLs.      Hand Dominance        Extremity/Trunk Assessment   Upper Extremity Assessment Upper Extremity Assessment: RUE deficits/detail;LUE deficits/detail RUE Deficits / Details: R shoulder F 3/5 (pt reports h/o R shoulder surgery) RUE Sensation: WNL LUE Deficits / Details: Strength WNL LUE Coordination: decreased gross motor(pt report diplopia with finger to nose)    Lower Extremity Assessment Lower Extremity Assessment: Overall WFL for tasks assessed       Communication   Communication: No difficulties  Cognition Arousal/Alertness: Awake/alert Behavior During Therapy: WFL for tasks assessed/performed Overall Cognitive Status: Within Functional Limits for tasks assessed                                        General Comments General comments (skin integrity, edema, etc.): Vision tracking WNL all 4 quadrants. Pt initially reports that she experiences her double vision if she looks up and to the L. Toward the end of ambulation the pt says that her double vision comes and goes.   Pt instructed that when she does experience diplopia and is walking, that she should stop and "re-set" before continuing on to avoid fall, pt verbalized understanding. Balance tested.  Pt requires UE support for SLS.  Pt able to maintain tandem stance for ~7 seconds before requiring UE assist.  Pt with increased sway with rhomberg stance with eyes closed but no LOB.     Exercises     Assessment/Plan    PT Assessment Patient needs continued PT services  PT Problem List Decreased  strength;Decreased balance;Decreased safety awareness       PT Treatment Interventions Balance training;Neuromuscular re-education;Patient/family education;Therapeutic activities;Therapeutic exercise;Functional mobility training;Gait training    PT Goals (Current goals can be found in the Care Plan section)  Acute Rehab PT Goals Patient Stated Goal: to go home and return to PLOF PT Goal Formulation: With patient Time For Goal Achievement: 06/06/18 Potential to Achieve Goals: Good    Frequency Min 2X/week   Barriers to discharge        Co-evaluation               AM-PAC PT "6 Clicks" Daily Activity  Outcome Measure Difficulty turning over in bed (including adjusting bedclothes, sheets and blankets)?: None Difficulty moving from lying on back to sitting on the side of the bed? : None Difficulty sitting down on and standing up from a chair with arms (e.g., wheelchair, bedside commode, etc,.)?: None Help needed moving to and from a bed to chair (including a wheelchair)?: None Help needed walking in hospital room?: A Little Help needed  climbing 3-5 steps with a railing? : A Little 6 Click Score: 22    End of Session Equipment Utilized During Treatment: Gait belt Activity Tolerance: Patient tolerated treatment well Patient left: in chair;with call bell/phone within reach;with family/visitor present Nurse Communication: Mobility status;Other (comment)(Pt will benefit from OT eval before D/C) PT Visit Diagnosis: Unsteadiness on feet (R26.81);History of falling (Z91.81)    Time: 0601-5615 PT Time Calculation (min) (ACUTE ONLY): 21 min   Charges:   PT Evaluation $PT Eval Low Complexity: 1 Low PT Treatments $Gait Training: 8-22 mins   PT G Codes:        Collie Siad PT, DPT 05/23/2018, 12:06 PM

## 2018-05-23 NOTE — Discharge Summary (Signed)
[facility] NAME: Amy Gilmore    MR#:  [id number]  DATE OF BIRTH:  09-Nov-1936  DATE OF ADMISSION:  05/22/2018 ADMITTING PHYSICIAN: Saundra Shelling, MD  DATE OF DISCHARGE: 05/23/2018  PRIMARY CARE PHYSICIAN: Lavera Guise, MD    ADMISSION DIAGNOSIS:  Diplopia [H53.2] Dizziness [R42]  DISCHARGE DIAGNOSIS:  Active Problems:   TIA (transient ischemic attack)   SECONDARY DIAGNOSIS:   Past Medical History:  Diagnosis Date  . Adenomatous colon polyp 07/03/2012  . Diverticulosis   . Frequency of urination   . History of kidney stones   . Hyperlipidemia   . Hypertension   . Internal hemorrhoids   . Mitral valve prolapse per dr wall note 09/12--  stable  . PAC (premature atrial contraction) followed by cardiologist --  dr wall--  last visit  sept 12  note in epic--  states stable  . Palpitations hx pac's-   occasional  . Renal calculus, right followed by dr Karsten Ro   February 2013  . Right shoulder pain Dec 07, 2011 fall   pain at times with positioning  . Urgency of micturation   . Vertigo, intermittent     HOSPITAL COURSE:   82 year old female with past medical history of vertigo, history of mitral valve prolapse, hypertension, hyperlipidemia, history of nephrolithiasis, diverticulosis who presents to the hospital due to double vision/diplopia.  1.  TIA-this was the working diagnosis given the patient's double vision/diplopia.  Patient was admitted to the hospital under observation underwent extensive neurologic testing including CT head, MRI of the brain, carotid duplex all of which were within normal range.  Patient's MRI showed no acute stroke carotid duplex showed no hemodynamically significant carotid artery stenosis. - Her echocardiogram has been done but the results are still pending.  Patient is still having some mild double vision but improved since yesterday.  TIA/CVA has been ruled out.  Patient should follow-up  with outpatient ophthalmology.  2.  Essential hypertension-patient will resume her propranolol, valsartan/HCTZ.  3.  Hyperlipidemia-patient will resume her atorvastatin.  4.  Vertigo-patient will continue her meclizine as needed.  5. Osteoarthritis - pt. Will cont. Her Meloxicam.    DISCHARGE CONDITIONS:   Stable.   CONSULTS OBTAINED:  Treatment Team:  Catarina Hartshorn, MD Alexis Goodell, MD  DRUG ALLERGIES:   Allergies  Allergen Reactions  . Codeine Nausea Only  . Sulfa Antibiotics Rash    DISCHARGE MEDICATIONS:   Allergies as of 05/23/2018      Reactions   Codeine Nausea Only   Sulfa Antibiotics Rash      Medication List    STOP taking these medications   hydrALAZINE 10 MG tablet Commonly known as:  APRESOLINE     TAKE these medications   acetaminophen 500 MG tablet Commonly known as:  TYLENOL Take 1,000 mg by mouth every 6 (six) hours as needed. Pain   aspirin 81 MG tablet Take 81 mg by mouth daily.   atorvastatin 10 MG tablet Commonly known as:  LIPITOR Take 1 tablet (10 mg total) by mouth at bedtime.   CENTRUM SILVER tablet Take 1 tablet by mouth daily.   GENTEAL 0.3 % Soln Generic drug:  Hypromellose Place 1-2 drops into both eyes 2 (two) times daily as needed. Dry eyes   Glucosamine 500 MG Caps Take 500 mg by mouth daily.   meclizine 25 MG tablet Commonly known as:  ANTIVERT Take 25 mg by mouth as needed. Inner ear  meloxicam 15 MG tablet Commonly known as:  MOBIC TAKE 1 TABLET BY MOUTH EVERY DAY AFTER MEALS   propranolol ER 60 MG 24 hr capsule Commonly known as:  INDERAL LA Take 60 mg by mouth daily before breakfast.   RESTASIS 0.05 % ophthalmic emulsion Generic drug:  cycloSPORINE INSTILL 1 DROP INTO BOTH EYES TWICE A DAY   valsartan-hydrochlorothiazide 160-12.5 MG tablet Commonly known as:  DIOVAN-HCT Take 1 tablet by mouth daily.         DISCHARGE INSTRUCTIONS:   DIET:  Cardiac diet  DISCHARGE CONDITION:   Stable  ACTIVITY:  Activity as tolerated  OXYGEN:  Home Oxygen: No.   Oxygen Delivery: room air  DISCHARGE LOCATION:  home   If you experience worsening of your admission symptoms, develop shortness of breath, life threatening emergency, suicidal or homicidal thoughts you must seek medical attention immediately by calling 911 or calling your MD immediately  if symptoms less severe.  You Must read complete instructions/literature along with all the possible adverse reactions/side effects for all the Medicines you take and that have been prescribed to you. Take any new Medicines after you have completely understood and accpet all the possible adverse reactions/side effects.   Please note  You were cared for by a hospitalist during your hospital stay. If you have any questions about your discharge medications or the care you received while you were in the hospital after you are discharged, you can call the unit and asked to speak with the hospitalist on call if the hospitalist that took care of you is not available. Once you are discharged, your primary care physician will handle any further medical issues. Please note that NO REFILLS for any discharge medications will be authorized once you are discharged, as it is imperative that you return to your primary care physician (or establish a relationship with a primary care physician if you do not have one) for your aftercare needs so that they can reassess your need for medications and monitor your lab values.     Today   Still has some mild double vision but improved since yesterday.  CT head/MRI was negative for CVA.  Will discharge home with outpatient ophthalmology referral and neurology follow-up.  VITAL SIGNS:  Blood pressure (!) 161/82, pulse (!) 59, temperature 97.8 F (36.6 C), temperature source Oral, resp. rate 17, height 5\' 2"  (1.575 m), weight 56.7 kg (125 lb), SpO2 100 %.  I/O:    Intake/Output Summary (Last 24 hours) at  05/23/2018 1423 Last data filed at 05/23/2018 1350 Gross per 24 hour  Intake 355 ml  Output -  Net 355 ml    PHYSICAL EXAMINATION:  GENERAL:  82 y.o.-year-old patient lying in the bed with no acute distress.  EYES: Pupils equal, round, reactive to light and accommodation. No scleral icterus. Extraocular muscles intact.  HEENT: Head atraumatic, normocephalic. Oropharynx and nasopharynx clear.  NECK:  Supple, no jugular venous distention. No thyroid enlargement, no tenderness.  LUNGS: Normal breath sounds bilaterally, no wheezing, rales,rhonchi. No use of accessory muscles of respiration.  CARDIOVASCULAR: S1, S2 normal. No murmurs, rubs, or gallops.  ABDOMEN: Soft, non-tender, non-distended. Bowel sounds present. No organomegaly or mass.  EXTREMITIES: No pedal edema, cyanosis, or clubbing.  NEUROLOGIC: Cranial nerves II through XII are intact. No focal motor or sensory defecits b/l.  PSYCHIATRIC: The patient is alert and oriented x 3.  SKIN: No obvious rash, lesion, or ulcer.   DATA REVIEW:   CBC Recent Labs  Lab  05/22/18 1015  WBC 5.8  HGB 13.9  HCT 39.7  PLT 327    Chemistries  Recent Labs  Lab 05/22/18 1015  NA 138  K 4.0  CL 101  CO2 28  GLUCOSE 108*  BUN 17  CREATININE 0.47  CALCIUM 9.0  AST 34  ALT 33  ALKPHOS 71  BILITOT 0.9    Cardiac Enzymes Recent Labs  Lab 05/22/18 1015  TROPONINI <0.03    Microbiology Results  Results for orders placed or performed during the hospital encounter of 01/28/12  Wound culture     Status: None   Collection Time: 01/28/12 11:42 AM  Result Value Ref Range Status   Specimen Description WOUND OTHER  Final   Special Requests URETER RIGHT/STAGHORN CALCULUS  Final   Gram Stain   Final    NO WBC SEEN NO SQUAMOUS EPITHELIAL CELLS SEEN NO ORGANISMS SEEN   Culture NO GROWTH 2 DAYS  Final   Report Status 01/30/2012 FINAL  Final    RADIOLOGY:  Ct Angio Head W Or Wo Contrast  Result Date: 05/22/2018 CLINICAL DATA:   Stroke follow-up EXAM: CT ANGIOGRAPHY HEAD AND NECK TECHNIQUE: Multidetector CT imaging of the head and neck was performed using the standard protocol during bolus administration of intravenous contrast. Multiplanar CT image reconstructions and MIPs were obtained to evaluate the vascular anatomy. Carotid stenosis measurements (when applicable) are obtained utilizing NASCET criteria, using the distal internal carotid diameter as the denominator. CONTRAST:  64mL ISOVUE-370 IOPAMIDOL (ISOVUE-370) INJECTION 76% COMPARISON:  Noncontrast head CT from earlier today FINDINGS: CTA NECK FINDINGS Aortic arch: Minimal atheromatous changes. Two vessel branching. Negative for dissection. Right carotid system: Limited atherosclerosis. No stenosis, ulceration, dissection, or beading. Left carotid system: Tortuosity without atheromatous change, stenosis, or dissection. Vertebral arteries: Mild proximal subclavian atherosclerosis on the left. Left dominant vertebral artery that is tortuous at the V2 and V3 segment, projecting into the spinal canal at one point. Right vertebral artery is also tortuous. Both vessels are smooth and widely patent to the dura. Skeleton: No acute or aggressive finding. Advanced and erosive arthropathy at the C1-2 facets and atlantodental interval. No chart history of rheumatoid arthritis. No notable soft tissue calcification. There is ligamentous thickening behind the dens without notable pannus and no cervicomedullary contact. Other neck: No acute or aggressive finding. Upper chest: Negative Review of the MIP images confirms the above findings CTA HEAD FINDINGS Anterior circulation: Mild atheromatous changes. No branch occlusion, beading, or aneurysm. Hypoplastic right A1 segment Posterior circulation: Strong left vertebral artery dominance. Minimal contribution to the basilar by the right vertebral artery. Robust flow in vertebrobasilar branches. Negative for aneurysm. Venous sinuses: Patent Anatomic  variants: As above Delayed phase: No abnormal intracranial enhancement Review of the MIP images confirms the above findings IMPRESSION: 1. No emergent large vessel occlusion. 2. Mild atherosclerosis for age.  No flow limiting stenosis. Electronically Signed   By: Monte Fantasia M.D.   On: 05/22/2018 11:03   Ct Angio Neck W Or Wo Contrast  Result Date: 05/22/2018 CLINICAL DATA:  Stroke follow-up EXAM: CT ANGIOGRAPHY HEAD AND NECK TECHNIQUE: Multidetector CT imaging of the head and neck was performed using the standard protocol during bolus administration of intravenous contrast. Multiplanar CT image reconstructions and MIPs were obtained to evaluate the vascular anatomy. Carotid stenosis measurements (when applicable) are obtained utilizing NASCET criteria, using the distal internal carotid diameter as the denominator. CONTRAST:  26mL ISOVUE-370 IOPAMIDOL (ISOVUE-370) INJECTION 76% COMPARISON:  Noncontrast head CT from earlier today  FINDINGS: CTA NECK FINDINGS Aortic arch: Minimal atheromatous changes. Two vessel branching. Negative for dissection. Right carotid system: Limited atherosclerosis. No stenosis, ulceration, dissection, or beading. Left carotid system: Tortuosity without atheromatous change, stenosis, or dissection. Vertebral arteries: Mild proximal subclavian atherosclerosis on the left. Left dominant vertebral artery that is tortuous at the V2 and V3 segment, projecting into the spinal canal at one point. Right vertebral artery is also tortuous. Both vessels are smooth and widely patent to the dura. Skeleton: No acute or aggressive finding. Advanced and erosive arthropathy at the C1-2 facets and atlantodental interval. No chart history of rheumatoid arthritis. No notable soft tissue calcification. There is ligamentous thickening behind the dens without notable pannus and no cervicomedullary contact. Other neck: No acute or aggressive finding. Upper chest: Negative Review of the MIP images confirms  the above findings CTA HEAD FINDINGS Anterior circulation: Mild atheromatous changes. No branch occlusion, beading, or aneurysm. Hypoplastic right A1 segment Posterior circulation: Strong left vertebral artery dominance. Minimal contribution to the basilar by the right vertebral artery. Robust flow in vertebrobasilar branches. Negative for aneurysm. Venous sinuses: Patent Anatomic variants: As above Delayed phase: No abnormal intracranial enhancement Review of the MIP images confirms the above findings IMPRESSION: 1. No emergent large vessel occlusion. 2. Mild atherosclerosis for age.  No flow limiting stenosis. Electronically Signed   By: Monte Fantasia M.D.   On: 05/22/2018 11:03   Mr Brain Wo Contrast  Result Date: 05/22/2018 CLINICAL DATA:  New onset diplopia and dizziness today. History of hypertension, hyperlipidemia and intermittent vertigo. EXAM: MRI HEAD WITHOUT CONTRAST MRA HEAD WITHOUT CONTRAST TECHNIQUE: Multiplanar, multiecho pulse sequences of the brain and surrounding structures were obtained without intravenous contrast. Angiographic images of the head were obtained using MRA technique without contrast. COMPARISON:  CT/CTA HEAD May 22, 2018 FINDINGS: MRI HEAD FINDINGS INTRACRANIAL CONTENTS: No reduced diffusion to suggest acute ischemia. No susceptibility artifact to suggest hemorrhage. The ventricles and sulci are normal for patient's age. Patchy supratentorial white matter FLAIR T2 hyperintensities compatible with mild chronic small vessel ischemic changes, less than expected for age. No suspicious parenchymal signal, masses, mass effect. No abnormal extra-axial fluid collections. No extra-axial masses. VASCULAR: Normal major intracranial vascular flow voids present at skull base. SKULL AND UPPER CERVICAL SPINE: No abnormal sellar expansion. No suspicious calvarial bone marrow signal. Craniocervical junction maintained. SINUSES/ORBITS: Trace RIGHT mastoid effusion. Paranasal sinuses are well  aerated. Status post bilateral ocular lens implants.The included ocular globes and orbital contents are non-suspicious. OTHER: None. MRA HEAD FINDINGS-mild motion degraded examination. ANTERIOR CIRCULATION: Normal flow related enhancement of the included cervical, petrous, cavernous and supraclinoid internal carotid arteries. Patent anterior communicating artery. Patent anterior and middle cerebral arteries. No large vessel occlusion, flow limiting stenosis, aneurysm. POSTERIOR CIRCULATION: Poor flow related enhancement bilateral vertebral arteries due to tortuosity, vessels were patent on today's CTA. Patent basilar artery, with normal flow related enhancement of the main branch vessels. Patent posterior cerebral arteries. No large vessel occlusion, flow limiting stenosis,  aneurysm. ANATOMIC VARIANTS: None. Source images and MIP images were reviewed. IMPRESSION: MRI HEAD: 1. Negative noncontrast MRI head. MRA HEAD: 1. No emergent large vessel occlusion or flow-limiting stenosis on this motion degraded examination. Findings better demonstrated on today's CTA HEAD. Electronically Signed   By: Elon Alas M.D.   On: 05/22/2018 21:11   US Carotid Bilateral (at [facility] Only)  Result Date: 05/23/2018 CLINICAL DATA:  TIA EXAM: BILATERAL CAROTID DUPLEX ULTRASOUND TECHNIQUE: Pearline Cables scale imaging, color Doppler and duplex  ultrasound were performed of bilateral carotid and vertebral arteries in the neck. COMPARISON:  None. FINDINGS: Criteria: Quantification of carotid stenosis is based on velocity parameters that correlate the residual internal carotid diameter with NASCET-based stenosis levels, using the diameter of the distal internal carotid lumen as the denominator for stenosis measurement. The following velocity measurements were obtained: RIGHT ICA:  73 cm/sec CCA:  76 cm/sec SYSTOLIC ICA/CCA RATIO:  1.0 DIASTOLIC ICA/CCA RATIO: ECA:  70 cm/sec LEFT ICA:  64 cm/sec CCA:  65 cm/sec SYSTOLIC ICA/CCA RATIO:   1.0 DIASTOLIC ICA/CCA RATIO: ECA:  53 cm/sec RIGHT CAROTID ARTERY: Mild calcified plaque in the bulb. Low resistance internal carotid Doppler pattern is preserved. RIGHT VERTEBRAL ARTERY:  Antegrade. LEFT CAROTID ARTERY: Little if any plaque in the bulb. Low resistance internal carotid Doppler pattern is preserved. LEFT VERTEBRAL ARTERY:  Antegrade. IMPRESSION: Less than 50% stenosis in the right and left internal carotid arteries. Electronically Signed   By: Marybelle Killings M.D.   On: 05/23/2018 10:57   Mr Jodene Nam Head/brain TO Cm  Result Date: 05/22/2018 CLINICAL DATA:  New onset diplopia and dizziness today. History of hypertension, hyperlipidemia and intermittent vertigo. EXAM: MRI HEAD WITHOUT CONTRAST MRA HEAD WITHOUT CONTRAST TECHNIQUE: Multiplanar, multiecho pulse sequences of the brain and surrounding structures were obtained without intravenous contrast. Angiographic images of the head were obtained using MRA technique without contrast. COMPARISON:  CT/CTA HEAD May 22, 2018 FINDINGS: MRI HEAD FINDINGS INTRACRANIAL CONTENTS: No reduced diffusion to suggest acute ischemia. No susceptibility artifact to suggest hemorrhage. The ventricles and sulci are normal for patient's age. Patchy supratentorial white matter FLAIR T2 hyperintensities compatible with mild chronic small vessel ischemic changes, less than expected for age. No suspicious parenchymal signal, masses, mass effect. No abnormal extra-axial fluid collections. No extra-axial masses. VASCULAR: Normal major intracranial vascular flow voids present at skull base. SKULL AND UPPER CERVICAL SPINE: No abnormal sellar expansion. No suspicious calvarial bone marrow signal. Craniocervical junction maintained. SINUSES/ORBITS: Trace RIGHT mastoid effusion. Paranasal sinuses are well aerated. Status post bilateral ocular lens implants.The included ocular globes and orbital contents are non-suspicious. OTHER: None. MRA HEAD FINDINGS-mild motion degraded examination.  ANTERIOR CIRCULATION: Normal flow related enhancement of the included cervical, petrous, cavernous and supraclinoid internal carotid arteries. Patent anterior communicating artery. Patent anterior and middle cerebral arteries. No large vessel occlusion, flow limiting stenosis, aneurysm. POSTERIOR CIRCULATION: Poor flow related enhancement bilateral vertebral arteries due to tortuosity, vessels were patent on today's CTA. Patent basilar artery, with normal flow related enhancement of the main branch vessels. Patent posterior cerebral arteries. No large vessel occlusion, flow limiting stenosis,  aneurysm. ANATOMIC VARIANTS: None. Source images and MIP images were reviewed. IMPRESSION: MRI HEAD: 1. Negative noncontrast MRI head. MRA HEAD: 1. No emergent large vessel occlusion or flow-limiting stenosis on this motion degraded examination. Findings better demonstrated on today's CTA HEAD. Electronically Signed   By: Elon Alas M.D.   On: 05/22/2018 21:11   Ct Head Code Stroke Wo Contrast  Result Date: 05/22/2018 CLINICAL DATA:  Code stroke.  Sudden onset of diplopia. EXAM: CT HEAD WITHOUT CONTRAST TECHNIQUE: Contiguous axial images were obtained from the base of the skull through the vertex without intravenous contrast. COMPARISON:  None. FINDINGS: Brain: Mild atrophy and white matter changes are within normal limits for age. No acute infarct, hemorrhage, or mass lesion is present. The basal ganglia are intact. Insular ribbon is normal bilaterally. Brainstem and cerebellum are normal. The sella is within normal limits. Optic chiasm unremarkable. Vascular: Atherosclerotic  calcifications are present within the cavernous internal carotid arteries bilaterally. There is no hyperdense vessel associated. Skull: Calvarium is intact. No focal lytic or blastic lesions are present. No significant extra-axial fluid collections are present. Sinuses/Orbits: The paranasal sinuses and mastoid air cells are clear. Globes and  orbits are within normal limits. ASPECTS [facility] Stroke Program Early CT Score) - Ganglionic level infarction (caudate, lentiform nuclei, internal capsule, insula, M1-M3 cortex): 7/7 - Supraganglionic infarction (M4-M6 cortex): 3/3 Total score (0-10 with 10 being normal): 10/10 IMPRESSION: 1. Normal CT appearance of the brain for age. No acute or focal lesion to explain diplopia. 2. ASPECTS is 10/10 These results were called by telephone at the time of interpretation on 05/22/2018 at 10:22 am to Dr. Lenise Arena , who verbally acknowledged these results. Electronically Signed   By: San Morelle M.D.   On: 05/22/2018 10:23      Management plans discussed with the patient, family and they are in agreement.  CODE STATUS:     Code Status Orders  (From admission, onward)        Start     Ordered   05/22/18 1615  Full code  Continuous     05/22/18 1614     TOTAL TIME TAKING CARE OF THIS PATIENT: 40 minutes.    Henreitta Leber M.D on 05/23/2018 at 2:23 PM  Between 7am to 6pm - Pager - [phone]  After 6pm go to [email or web address] - password [email or web address] [facility]  Office  [phone]  CC: Primary care physician; Lavera Guise, MD

## 2018-05-23 NOTE — Progress Notes (Signed)
OT Cancellation Note  Patient Details Name: Amy Gilmore MRN: [id number] DOB: 02/12/1936   Cancelled Treatment:    Reason Eval/Treat Not Completed: OT screened, no needs identified, will sign off. Order received, chart reviewed. Met with pt, spouse, and pt's daughter. No sensory, coordination, strength, visual, or cognitive deficits noted. Pt states feeling "back to normal." Pt denies diplopia at this time. Briefly educated pt/family in s/s of a stroke, how OT can address functional deficits resulting from visual impairments, and how to access OT services in the future if needed (by going to her PCP for a referral). No skilled OT needs at this time. Will sign off. Please re-consult if additional needs arise. Notified RN.   Jeni Salles, MPH, MS, OTR/L ascom [phone] 05/23/18, 2:38 PM

## 2018-05-23 NOTE — Progress Notes (Signed)
Received MD order to discharge patient to home, reviewed home meds prescriptions and discharge institutions with patient and patient verbalized understanding

## 2018-05-23 NOTE — Plan of Care (Signed)
  Problem: Education: Goal: Knowledge of General Education information will improve Outcome: Progressing   Problem: Education: Goal: Knowledge of secondary prevention will improve Outcome: Progressing Goal: Knowledge of patient specific risk factors addressed and post discharge goals established will improve Outcome: Progressing   Problem: Ischemic Stroke/TIA Tissue Perfusion: Goal: Complications of ischemic stroke/TIA will be minimized Outcome: Progressing

## 2018-05-23 NOTE — Progress Notes (Signed)
*  PRELIMINARY RESULTS* Echocardiogram 2D Echocardiogram has been performed.  Amy Gilmore 05/23/2018, 2:22 PM

## 2018-05-23 NOTE — Care Management (Addendum)
No discharge needs identified by members of the care team other than having order placed for outpatient therapy- PT and OPT

## 2018-05-23 NOTE — Progress Notes (Signed)
OT Cancellation Note  Patient Details Name: Amy Gilmore MRN: [id number] DOB: 12-Jul-1936   Cancelled Treatment:    Reason Eval/Treat Not Completed: Other (comment). Order received, chart reviewed. Pt receiving echo. Will re-attempt OT evaluation at a later time.   Jeni Salles, MPH, MS, OTR/L ascom [phone] 05/23/18, 1:49 PM

## 2018-05-23 NOTE — Progress Notes (Signed)
SLP Cancellation Note    Patient Details Name: Amy Gilmore MRN: [id number] DOB: Mar 28, 1936   Cancelled treatment:         Patient is at baseline with functional speech/communication.  Patient does not require speech therapy.  SLP available if any concerns regarding speech/communication/cognition/swallowing arise.  Leroy Sea, MS/CCC- SLP  Lou Miner 05/23/2018, 10:24 AM

## 2018-05-23 NOTE — Progress Notes (Addendum)
Subjective: Patient reports improvement in diplopia although at times will still experience on looking to the left.  Does not feel dizzy.    Objective: Current vital signs: BP (!) 161/82 (BP Location: Left Arm)   Pulse (!) 59   Temp 97.8 F (36.6 C) (Oral)   Resp 17   Ht _0  (1.575 m)   Wt 56.7 kg (125 lb)   SpO2 100%   BMI 22.86 kg/m  Vital signs in last 24 hours: Temp:  [97.3 F (36.3 C)-97.8 F (36.6 C)] 97.8 F (36.6 C) (06/21 1000) Pulse Rate:  [48-65] 59 (06/21 1000) Resp:  [12-19] 17 (06/21 1000) BP: (132-182)/(76-150) 161/82 (06/21 1000) SpO2:  [80 %-100 %] 100 % (06/21 1000)  Intake/Output from previous day: 06/20 0701 - 06/21 0700 In: 115 [P.O.:115] Out: -  Intake/Output this shift: No intake/output data recorded. Nutritional status:  Diet Order           Diet Heart Room service appropriate? Yes; Fluid consistency: Thin  Diet effective now          Neurologic Exam: Mental Status: Alert, oriented, thought content appropriate.  Speech fluent without evidence of aphasia.  Able to follow 3 step commands without difficulty. Cranial Nerves: II: Discs flat bilaterally; Visual fields grossly normal, pupils equal, round, reactive to light and accommodation III,IV, VI: ptosis not present, extra-ocular motions intact V,VII: smile symmetric, facial light touch sensation normal bilaterally VIII: hearing normal bilaterally IX,X: gag reflex present XI: bilateral shoulder shrug XII: midline tongue extension Motor: Right :  Upper extremity   5/5                                      Left:     Upper extremity   5/5             Lower extremity   5/5                                                  Lower extremity   5/5 Tone and bulk:normal tone throughout; no atrophy noted Sensory: Pinprick and light touch intact throughout, bilaterally Deep Tendon Reflexes: 2+ and symmetric with absent AJ's bilaterally  Lab Results: Basic Metabolic Panel: Recent Labs  Lab  05/22/18 1015  NA 138  K 4.0  CL 101  CO2 28  GLUCOSE 108*  BUN 17  CREATININE 0.47  CALCIUM 9.0    Liver Function Tests: Recent Labs  Lab 05/22/18 1015  AST 34  ALT 33  ALKPHOS 71  BILITOT 0.9  PROT 6.8  ALBUMIN 4.1   No results for input(s): LIPASE, AMYLASE in the last 168 hours. No results for input(s): AMMONIA in the last 168 hours.  CBC: Recent Labs  Lab 05/22/18 1015  WBC 5.8  NEUTROABS 3.2  HGB 13.9  HCT 39.7  MCV 99.4  PLT 327    Cardiac Enzymes: Recent Labs  Lab 05/22/18 1015  TROPONINI <0.03    Lipid Panel: Recent Labs  Lab 05/23/18 0343  CHOL 169  TRIG 83  HDL 59  CHOLHDL 2.9  VLDL 17  LDLCALC 93    CBG: Recent Labs  Lab 05/22/18 0958  GLUCAP 110*    Microbiology: Results for orders placed or performed during the hospital encounter  of 01/28/12  Wound culture     Status: None   Collection Time: 01/28/12 11:42 AM  Result Value Ref Range Status   Specimen Description WOUND OTHER  Final   Special Requests URETER RIGHT/STAGHORN CALCULUS  Final   Gram Stain   Final    NO WBC SEEN NO SQUAMOUS EPITHELIAL CELLS SEEN NO ORGANISMS SEEN   Culture NO GROWTH 2 DAYS  Final   Report Status 01/30/2012 FINAL  Final    Coagulation Studies: Recent Labs    05/22/18 1015  LABPROT 12.0  INR 0.89    Imaging: Ct Angio Head W Or Wo Contrast  Result Date: 05/22/2018 CLINICAL DATA:  Stroke follow-up EXAM: CT ANGIOGRAPHY HEAD AND NECK TECHNIQUE: Multidetector CT imaging of the head and neck was performed using the standard protocol during bolus administration of intravenous contrast. Multiplanar CT image reconstructions and MIPs were obtained to evaluate the vascular anatomy. Carotid stenosis measurements (when applicable) are obtained utilizing NASCET criteria, using the distal internal carotid diameter as the denominator. CONTRAST:  25m ISOVUE-370 IOPAMIDOL (ISOVUE-370) INJECTION 76% COMPARISON:  Noncontrast head CT from earlier today  FINDINGS: CTA NECK FINDINGS Aortic arch: Minimal atheromatous changes. Two vessel branching. Negative for dissection. Right carotid system: Limited atherosclerosis. No stenosis, ulceration, dissection, or beading. Left carotid system: Tortuosity without atheromatous change, stenosis, or dissection. Vertebral arteries: Mild proximal subclavian atherosclerosis on the left. Left dominant vertebral artery that is tortuous at the V2 and V3 segment, projecting into the spinal canal at one point. Right vertebral artery is also tortuous. Both vessels are smooth and widely patent to the dura. Skeleton: No acute or aggressive finding. Advanced and erosive arthropathy at the C1-2 facets and atlantodental interval. No chart history of rheumatoid arthritis. No notable soft tissue calcification. There is ligamentous thickening behind the dens without notable pannus and no cervicomedullary contact. Other neck: No acute or aggressive finding. Upper chest: Negative Review of the MIP images confirms the above findings CTA HEAD FINDINGS Anterior circulation: Mild atheromatous changes. No branch occlusion, beading, or aneurysm. Hypoplastic right A1 segment Posterior circulation: Strong left vertebral artery dominance. Minimal contribution to the basilar by the right vertebral artery. Robust flow in vertebrobasilar branches. Negative for aneurysm. Venous sinuses: Patent Anatomic variants: As above Delayed phase: No abnormal intracranial enhancement Review of the MIP images confirms the above findings IMPRESSION: 1. No emergent large vessel occlusion. 2. Mild atherosclerosis for age.  No flow limiting stenosis. Electronically Signed   By: JMonte FantasiaM.D.   On: 05/22/2018 11:03   Ct Angio Neck W Or Wo Contrast  Result Date: 05/22/2018 CLINICAL DATA:  Stroke follow-up EXAM: CT ANGIOGRAPHY HEAD AND NECK TECHNIQUE: Multidetector CT imaging of the head and neck was performed using the standard protocol during bolus administration of  intravenous contrast. Multiplanar CT image reconstructions and MIPs were obtained to evaluate the vascular anatomy. Carotid stenosis measurements (when applicable) are obtained utilizing NASCET criteria, using the distal internal carotid diameter as the denominator. CONTRAST:  759mISOVUE-370 IOPAMIDOL (ISOVUE-370) INJECTION 76% COMPARISON:  Noncontrast head CT from earlier today FINDINGS: CTA NECK FINDINGS Aortic arch: Minimal atheromatous changes. Two vessel branching. Negative for dissection. Right carotid system: Limited atherosclerosis. No stenosis, ulceration, dissection, or beading. Left carotid system: Tortuosity without atheromatous change, stenosis, or dissection. Vertebral arteries: Mild proximal subclavian atherosclerosis on the left. Left dominant vertebral artery that is tortuous at the V2 and V3 segment, projecting into the spinal canal at one point. Right vertebral artery is also tortuous. Both vessels  are smooth and widely patent to the dura. Skeleton: No acute or aggressive finding. Advanced and erosive arthropathy at the C1-2 facets and atlantodental interval. No chart history of rheumatoid arthritis. No notable soft tissue calcification. There is ligamentous thickening behind the dens without notable pannus and no cervicomedullary contact. Other neck: No acute or aggressive finding. Upper chest: Negative Review of the MIP images confirms the above findings CTA HEAD FINDINGS Anterior circulation: Mild atheromatous changes. No branch occlusion, beading, or aneurysm. Hypoplastic right A1 segment Posterior circulation: Strong left vertebral artery dominance. Minimal contribution to the basilar by the right vertebral artery. Robust flow in vertebrobasilar branches. Negative for aneurysm. Venous sinuses: Patent Anatomic variants: As above Delayed phase: No abnormal intracranial enhancement Review of the MIP images confirms the above findings IMPRESSION: 1. No emergent large vessel occlusion. 2. Mild  atherosclerosis for age.  No flow limiting stenosis. Electronically Signed   By: Monte Fantasia M.D.   On: 05/22/2018 11:03   Mr Brain Wo Contrast  Result Date: 05/22/2018 CLINICAL DATA:  New onset diplopia and dizziness today. History of hypertension, hyperlipidemia and intermittent vertigo. EXAM: MRI HEAD WITHOUT CONTRAST MRA HEAD WITHOUT CONTRAST TECHNIQUE: Multiplanar, multiecho pulse sequences of the brain and surrounding structures were obtained without intravenous contrast. Angiographic images of the head were obtained using MRA technique without contrast. COMPARISON:  CT/CTA HEAD May 22, 2018 FINDINGS: MRI HEAD FINDINGS INTRACRANIAL CONTENTS: No reduced diffusion to suggest acute ischemia. No susceptibility artifact to suggest hemorrhage. The ventricles and sulci are normal for patient's age. Patchy supratentorial white matter FLAIR T2 hyperintensities compatible with mild chronic small vessel ischemic changes, less than expected for age. No suspicious parenchymal signal, masses, mass effect. No abnormal extra-axial fluid collections. No extra-axial masses. VASCULAR: Normal major intracranial vascular flow voids present at skull base. SKULL AND UPPER CERVICAL SPINE: No abnormal sellar expansion. No suspicious calvarial bone marrow signal. Craniocervical junction maintained. SINUSES/ORBITS: Trace RIGHT mastoid effusion. Paranasal sinuses are well aerated. Status post bilateral ocular lens implants.The included ocular globes and orbital contents are non-suspicious. OTHER: None. MRA HEAD FINDINGS-mild motion degraded examination. ANTERIOR CIRCULATION: Normal flow related enhancement of the included cervical, petrous, cavernous and supraclinoid internal carotid arteries. Patent anterior communicating artery. Patent anterior and middle cerebral arteries. No large vessel occlusion, flow limiting stenosis, aneurysm. POSTERIOR CIRCULATION: Poor flow related enhancement bilateral vertebral arteries due to  tortuosity, vessels were patent on today's CTA. Patent basilar artery, with normal flow related enhancement of the main branch vessels. Patent posterior cerebral arteries. No large vessel occlusion, flow limiting stenosis,  aneurysm. ANATOMIC VARIANTS: None. Source images and MIP images were reviewed. IMPRESSION: MRI HEAD: 1. Negative noncontrast MRI head. MRA HEAD: 1. No emergent large vessel occlusion or flow-limiting stenosis on this motion degraded examination. Findings better demonstrated on today's CTA HEAD. Electronically Signed   By: Elon Alas M.D.   On: 05/22/2018 21:11   US Carotid Bilateral (at [facility] And [facility] Only)  Result Date: 05/23/2018 CLINICAL DATA:  TIA EXAM: BILATERAL CAROTID DUPLEX ULTRASOUND TECHNIQUE: Pearline Cables scale imaging, color Doppler and duplex ultrasound were performed of bilateral carotid and vertebral arteries in the neck. COMPARISON:  None. FINDINGS: Criteria: Quantification of carotid stenosis is based on velocity parameters that correlate the residual internal carotid diameter with NASCET-based stenosis levels, using the diameter of the distal internal carotid lumen as the denominator for stenosis measurement. The following velocity measurements were obtained: RIGHT ICA:  73 cm/sec CCA:  76 cm/sec SYSTOLIC ICA/CCA RATIO:  1.0 DIASTOLIC ICA/CCA  RATIO: ECA:  70 cm/sec LEFT ICA:  64 cm/sec CCA:  65 cm/sec SYSTOLIC ICA/CCA RATIO:  1.0 DIASTOLIC ICA/CCA RATIO: ECA:  53 cm/sec RIGHT CAROTID ARTERY: Mild calcified plaque in the bulb. Low resistance internal carotid Doppler pattern is preserved. RIGHT VERTEBRAL ARTERY:  Antegrade. LEFT CAROTID ARTERY: Little if any plaque in the bulb. Low resistance internal carotid Doppler pattern is preserved. LEFT VERTEBRAL ARTERY:  Antegrade. IMPRESSION: Less than 50% stenosis in the right and left internal carotid arteries. Electronically Signed   By: Marybelle Killings M.D.   On: 05/23/2018 10:57   Mr Jodene Nam Head/brain GY Cm  Result Date:  05/22/2018 CLINICAL DATA:  New onset diplopia and dizziness today. History of hypertension, hyperlipidemia and intermittent vertigo. EXAM: MRI HEAD WITHOUT CONTRAST MRA HEAD WITHOUT CONTRAST TECHNIQUE: Multiplanar, multiecho pulse sequences of the brain and surrounding structures were obtained without intravenous contrast. Angiographic images of the head were obtained using MRA technique without contrast. COMPARISON:  CT/CTA HEAD May 22, 2018 FINDINGS: MRI HEAD FINDINGS INTRACRANIAL CONTENTS: No reduced diffusion to suggest acute ischemia. No susceptibility artifact to suggest hemorrhage. The ventricles and sulci are normal for patient's age. Patchy supratentorial white matter FLAIR T2 hyperintensities compatible with mild chronic small vessel ischemic changes, less than expected for age. No suspicious parenchymal signal, masses, mass effect. No abnormal extra-axial fluid collections. No extra-axial masses. VASCULAR: Normal major intracranial vascular flow voids present at skull base. SKULL AND UPPER CERVICAL SPINE: No abnormal sellar expansion. No suspicious calvarial bone marrow signal. Craniocervical junction maintained. SINUSES/ORBITS: Trace RIGHT mastoid effusion. Paranasal sinuses are well aerated. Status post bilateral ocular lens implants.The included ocular globes and orbital contents are non-suspicious. OTHER: None. MRA HEAD FINDINGS-mild motion degraded examination. ANTERIOR CIRCULATION: Normal flow related enhancement of the included cervical, petrous, cavernous and supraclinoid internal carotid arteries. Patent anterior communicating artery. Patent anterior and middle cerebral arteries. No large vessel occlusion, flow limiting stenosis, aneurysm. POSTERIOR CIRCULATION: Poor flow related enhancement bilateral vertebral arteries due to tortuosity, vessels were patent on today's CTA. Patent basilar artery, with normal flow related enhancement of the main branch vessels. Patent posterior cerebral arteries.  No large vessel occlusion, flow limiting stenosis,  aneurysm. ANATOMIC VARIANTS: None. Source images and MIP images were reviewed. IMPRESSION: MRI HEAD: 1. Negative noncontrast MRI head. MRA HEAD: 1. No emergent large vessel occlusion or flow-limiting stenosis on this motion degraded examination. Findings better demonstrated on today's CTA HEAD. Electronically Signed   By: Elon Alas M.D.   On: 05/22/2018 21:11   Ct Head Code Stroke Wo Contrast  Result Date: 05/22/2018 CLINICAL DATA:  Code stroke.  Sudden onset of diplopia. EXAM: CT HEAD WITHOUT CONTRAST TECHNIQUE: Contiguous axial images were obtained from the base of the skull through the vertex without intravenous contrast. COMPARISON:  None. FINDINGS: Brain: Mild atrophy and white matter changes are within normal limits for age. No acute infarct, hemorrhage, or mass lesion is present. The basal ganglia are intact. Insular ribbon is normal bilaterally. Brainstem and cerebellum are normal. The sella is within normal limits. Optic chiasm unremarkable. Vascular: Atherosclerotic calcifications are present within the cavernous internal carotid arteries bilaterally. There is no hyperdense vessel associated. Skull: Calvarium is intact. No focal lytic or blastic lesions are present. No significant extra-axial fluid collections are present. Sinuses/Orbits: The paranasal sinuses and mastoid air cells are clear. Globes and orbits are within normal limits. ASPECTS [facility] Stroke Program Early CT Score) - Ganglionic level infarction (caudate, lentiform nuclei, internal capsule, insula, M1-M3 cortex): 7/7 - Supraganglionic  infarction (M4-M6 cortex): 3/3 Total score (0-10 with 10 being normal): 10/10 IMPRESSION: 1. Normal CT appearance of the brain for age. No acute or focal lesion to explain diplopia. 2. ASPECTS is 10/10 These results were called by telephone at the time of interpretation on 05/22/2018 at 10:22 am to Dr. Lenise Arena , who verbally acknowledged  these results. Electronically Signed   By: San Morelle M.D.   On: 05/22/2018 10:23    Medications:  I have reviewed the patient's current medications. Scheduled: . aspirin  300 mg Rectal Daily   Or  . aspirin  325 mg Oral Daily  . atorvastatin  10 mg Oral QHS  . cycloSPORINE  1 drop Both Eyes BID  . enoxaparin (LOVENOX) injection  40 mg Subcutaneous Q24H  . irbesartan  150 mg Oral Daily   And  . hydrochlorothiazide  12.5 mg Oral Daily  . multivitamin with minerals  1 tablet Oral Daily  . propranolol ER  60 mg Oral QAC breakfast    Assessment/Plan: Patient improved today.  MRI of the brain reviewed and shows no acute changes.  Unclear if presentation a TIA or if etiology is more ocular.  Further work up recommended.   Carotid dopplers show no evidence of hemodynamically significant stenosis.  Echocardiogram pending.  LDL 93.  Recommendations: 1. ASA 358m daily 2. Statin for lipid management with target LDL<70. 3. TSH, ESR, CRP, Myasthenia panel.  May follow up results on an outpatient basis.   4.  Echocardiogram pending.  If unremarkable no further neurologic intervention is recommended at this time.  If further questions arise, please call or page at that time.  Thank you for allowing neurology to participate in the care of this patient. Patient to follow up with ophthalmology and neurology on an outpatient basis.    LOS: 0 days   LAlexis Goodell MD Neurology [phone]/21/2019  11:29 AM

## 2018-05-26 DIAGNOSIS — H4911 Fourth [trochlear] nerve palsy, right eye: Secondary | ICD-10-CM | POA: Diagnosis not present

## 2018-05-26 DIAGNOSIS — H353130 Nonexudative age-related macular degeneration, bilateral, stage unspecified: Secondary | ICD-10-CM | POA: Diagnosis not present

## 2018-05-27 ENCOUNTER — Ambulatory Visit: Payer: Self-pay | Admitting: Internal Medicine

## 2018-05-29 ENCOUNTER — Ambulatory Visit (INDEPENDENT_AMBULATORY_CARE_PROVIDER_SITE_OTHER): Payer: Medicare Other | Admitting: Internal Medicine

## 2018-05-29 ENCOUNTER — Encounter: Payer: Self-pay | Admitting: Internal Medicine

## 2018-05-29 VITALS — BP 152/82 | HR 56 | Resp 16 | Ht 62.0 in | Wt 130.8 lb

## 2018-05-29 DIAGNOSIS — G459 Transient cerebral ischemic attack, unspecified: Secondary | ICD-10-CM | POA: Diagnosis not present

## 2018-05-29 DIAGNOSIS — R001 Bradycardia, unspecified: Secondary | ICD-10-CM | POA: Diagnosis not present

## 2018-05-29 DIAGNOSIS — I10 Essential (primary) hypertension: Secondary | ICD-10-CM

## 2018-05-29 DIAGNOSIS — E782 Mixed hyperlipidemia: Secondary | ICD-10-CM

## 2018-05-29 MED ORDER — HYDRALAZINE HCL 10 MG PO TABS: 10.0000 mg | ORAL_TABLET | Freq: Three times a day (TID) | ORAL | 3 refills | Status: DC

## 2018-05-29 NOTE — Progress Notes (Signed)
[facility] [place], [place] [address]  Internal MEDICINE  Office Visit Note  Patient Name: Amy Gilmore  [id number]  [id number]  Date of Service: 05/29/2018     Chief Complaint  Patient presents with  . Hospitalization Follow-up    double vision, all scans ok    Pt is here for recent hospital follow up.Pt was hospitalized for double vision  Other  This is a new problem. The current episode started 1 to 4 weeks ago. The problem has been resolved. Associated symptoms include vertigo. Pertinent negatives include no abdominal pain, arthralgias, chest pain, chills, coughing, diaphoresis, fatigue, headaches, nausea, neck pain or vomiting. Associated symptoms comments: Double vision. The symptoms are aggravated by standing. Treatments tried: pt was hospitalized, bp was elevated  The treatment provided significant (She was not taking her Hydralazine) relief.   Current Medication: Outpatient Encounter Medications as of 05/29/2018  Medication Sig  . acetaminophen (TYLENOL) 500 MG tablet Take 1,000 mg by mouth every 6 (six) hours as needed. Pain   . aspirin 81 MG tablet Take 81 mg by mouth daily.  Marland Kitchen atorvastatin (LIPITOR) 10 MG tablet Take 1 tablet (10 mg total) by mouth at bedtime.  . Glucosamine 500 MG CAPS Take 500 mg by mouth daily.   . Hypromellose (GENTEAL) 0.3 % SOLN Place 1-2 drops into both eyes 2 (two) times daily as needed. Dry eyes   . hydrALAZINE (APRESOLINE) 10 MG tablet Take 1 tablet (10 mg total) by mouth 3 (three) times daily.  . meclizine (ANTIVERT) 25 MG tablet Take 25 mg by mouth as needed. Inner ear  . meloxicam (MOBIC) 15 MG tablet TAKE 1 TABLET BY MOUTH EVERY DAY AFTER MEALS  . Multiple Vitamins-Minerals (CENTRUM SILVER) tablet Take 1 tablet by mouth daily.   . propranolol (INDERAL LA) 60 MG 24 hr capsule Take 60 mg by mouth daily before breakfast.   . RESTASIS 0.05 % ophthalmic emulsion INSTILL 1 DROP INTO BOTH EYES TWICE A DAY   . valsartan-hydrochlorothiazide (DIOVAN-HCT) 160-12.5 MG tablet Take 1 tablet by mouth daily.   Facility-Administered Encounter Medications as of 05/29/2018  Medication  . 0.9 %  sodium chloride infusion    Surgical History: Past Surgical History:  Procedure Laterality Date  . APPENDECTOMY  1955  . BUNIONECTOMY  1995   and osteotomy  . COLONOSCOPY  2017  . CYSTOSCOPY  02/22/2012   Procedure: CYSTOSCOPY;  Surgeon: Claybon Jabs, MD;  Location: [facility];  Service: Urology;  Laterality: Right;  removal of ureteral stent  . FLEXIBLE SIGMOIDOSCOPY  08/12/2012   Procedure: FLEXIBLE SIGMOIDOSCOPY;  Surgeon: Inda Castle, MD;  Location: [facility];  Service: Endoscopy;  Laterality: N/A;  . NEPHROLITHOTOMY  01/28/2012-  right ureteral stent placement   Procedure: NEPHROLITHOTOMY PERCUTANEOUS;  Surgeon: Claybon Jabs, MD;  Location: [facility];  Service: Urology;  Laterality: Right;  . OTHER SURGICAL HISTORY  01/28/12   right percutaneous nephroliathothoma  . REFRACTIVE SURGERY  yrs ago  . [place] and 2006   right  . URETEROSCOPY  02/22/2012   Procedure: URETEROSCOPY;  Surgeon: Claybon Jabs, MD;  Location[facility];  Service: Urology;  Laterality: Right;  URETEROSCOPY WITH STONE EXTRACTION   . VAGINAL HYSTERECTOMY  1985    Medical History: Past Medical History:  Diagnosis Date  . Adenomatous colon polyp 07/03/2012  . Diverticulosis   . Frequency of urination   . History of kidney stones   .  Hyperlipidemia   . Hypertension   . Internal hemorrhoids   . Mitral valve prolapse per dr wall note 09/12--  stable  . PAC (premature atrial contraction) followed by cardiologist --  dr wall--  last visit  sept 12  note in epic--  states stable  . Palpitations hx pac's-   occasional  . Renal calculus, right followed by dr Karsten Ro   February 2013  . Right shoulder pain Dec 07, 2011 fall   pain at times with positioning  . Urgency of  micturation   . Vertigo, intermittent     Family History: Family History  Problem Relation Age of Onset  . Liver cancer Mother   . Heart attack Sister   . Peripheral vascular disease Sister   . Colon cancer Neg Hx     Social History   Socioeconomic History  . Marital status: Married    Spouse name: Not on file  . Number of children: 3  . Years of education: Not on file  . Highest education level: Not on file  Occupational History  . Occupation: Housewife  Social Needs  . Financial resource strain: Not on file  . Food insecurity:    Worry: Not on file    Inability: Not on file  . Transportation needs:    Medical: Not on file    Non-medical: Not on file  Tobacco Use  . Smoking status: Former Smoker    Packs/day: 1.00    Years: 15.00    Pack years: 15.00    Last attempt to quit: 12/04/1975    Years since quitting: 42.5  . Smokeless tobacco: Never Used  Substance and Sexual Activity  . Alcohol use: Yes    Alcohol/week: 4.2 oz    Types: 7 Glasses of wine per week    Comment: Socially  . Drug use: No  . Sexual activity: Not on file  Lifestyle  . Physical activity:    Days per week: Not on file    Minutes per session: Not on file  . Stress: Not on file  Relationships  . Social connections:    Talks on phone: Not on file    Gets together: Not on file    Attends religious service: Not on file    Active member of club or organization: Not on file    Attends meetings of clubs or organizations: Not on file    Relationship status: Not on file  . Intimate partner violence:    Fear of current or ex partner: Not on file    Emotionally abused: Not on file    Physically abused: Not on file    Forced sexual activity: Not on file  Other Topics Concern  . Not on file  Social History Narrative   Married.   Gets regular exercise.   Caffeine daily. 3 cups of coffee or soda per day      Review of Systems  Constitutional: Negative for chills, diaphoresis and fatigue.   HENT: Negative for ear pain, postnasal drip and sinus pressure.   Eyes: Negative for photophobia, discharge, redness, itching and visual disturbance.  Respiratory: Negative for cough, shortness of breath and wheezing.   Cardiovascular: Negative for chest pain, palpitations and leg swelling.  Gastrointestinal: Negative for abdominal pain, constipation, diarrhea, nausea and vomiting.  Genitourinary: Negative for dysuria and flank pain.  Musculoskeletal: Negative for arthralgias, back pain, gait problem and neck pain.  Skin: Negative for color change.  Allergic/Immunologic: Negative for environmental allergies and food allergies.  Neurological: Positive for vertigo. Negative for dizziness and headaches.  Hematological: Does not bruise/bleed easily.  Psychiatric/Behavioral: Negative for agitation, behavioral problems (depression) and hallucinations.   Vital Signs: BP (!) 152/82   Pulse (!) 56   Resp 16   Ht 5\' 2"  (1.575 m)   Wt 130 lb 12.8 oz (59.3 kg)   SpO2 92%   BMI 23.92 kg/m    Physical Exam  Constitutional: She is oriented to person, place, and time. She appears well-developed and well-nourished. No distress.  HENT:  Head: Normocephalic and atraumatic.  Mouth/Throat: Oropharynx is clear and moist. No oropharyngeal exudate.  Eyes: Pupils are equal, round, and reactive to light. EOM are normal.  Neck: Normal range of motion. Neck supple. No JVD present. No tracheal deviation present. No thyromegaly present.  Cardiovascular: Normal rate, regular rhythm and normal heart sounds. Exam reveals no gallop and no friction rub.  No murmur heard. Pulmonary/Chest: Effort normal. No respiratory distress. She has no wheezes. She has no rales. She exhibits no tenderness.  Abdominal: Soft. Bowel sounds are normal.  Musculoskeletal: Normal range of motion.  Lymphadenopathy:    She has no cervical adenopathy.  Neurological: She is alert and oriented to person, place, and time. No cranial nerve  deficit.  Skin: Skin is warm and dry. She is not diaphoretic.  Psychiatric: She has a normal mood and affect. Her behavior is normal. Judgment and thought content normal.   Assessment/Plan: 1. TIA (transient ischemic attack) Resolved with all normal scans, continue Lipitor and ASA   2. Essential hypertension BP is slightly elevated, pt to continue with Hydralazine, diovan/hctz and Inderal as before, might need holtor monitor or adjustment of inderal, will discuss with Dr Candis Musa  3. Mixed hyperlipidemia Continue lipitor as before  4. Bradycardia Might need holtor monitor and adjustment in meds   General Counseling: Flor verbalizes understanding of the findings of todays visit and agrees with plan of treatment. I have discussed any further diagnostic evaluation that may be needed or ordered today. We also reviewed her medications today. she has been encouraged to call the office with any questions or concerns that should arise related to todays visit.   I have reviewed all medical records from hospital follow up including radiology reports and consults from other physicians. Appropriate follow up diagnostics will be scheduled as needed. Patient/ Family understands the plan of treatment. Time spent20 minutes.   Dr Lavera Guise, MD Internal Medicine

## 2018-06-04 ENCOUNTER — Other Ambulatory Visit: Payer: Self-pay | Admitting: Internal Medicine

## 2018-06-04 DIAGNOSIS — Z1231 Encounter for screening mammogram for malignant neoplasm of breast: Secondary | ICD-10-CM

## 2018-06-04 LAB — ACETYLCHOLINE RECEPTOR AB, ALL
ACETYLCHOL BLOCK AB: 12 % (ref 0–25)
Acety choline binding ab: <0.03 nmol/L (ref 0.00–0.24)
Acetylcholine Modulat Ab: <12 % (ref 0–20)

## 2018-06-17 ENCOUNTER — Other Ambulatory Visit: Payer: Self-pay | Admitting: Internal Medicine

## 2018-07-07 ENCOUNTER — Telehealth: Payer: Self-pay | Admitting: Cardiovascular Disease

## 2018-07-07 DIAGNOSIS — H353130 Nonexudative age-related macular degeneration, bilateral, stage unspecified: Secondary | ICD-10-CM | POA: Diagnosis not present

## 2018-07-07 DIAGNOSIS — H4911 Fourth [trochlear] nerve palsy, right eye: Secondary | ICD-10-CM | POA: Diagnosis not present

## 2018-07-07 NOTE — Telephone Encounter (Signed)
Please call pt regarding her BP medication. Her PCP thinks she will need to discuss with Dr. Rockey Situ.

## 2018-07-07 NOTE — Telephone Encounter (Signed)
Pt is calling back with her BP readings: 8/1-130/77 8/2- 150/86 8/3-120/79 8/4-130/90

## 2018-07-07 NOTE — Telephone Encounter (Signed)
S/w patient. At the end of June she started having double vision. It was ruled out that it was not a stroke. She recently saw her ophthalmologist who suggested she check with her cardiologist concerning her blood pressure readings.  She says her HR usually 50-60's. In reviewing her BP readings, she had difficulty getting the reading off her monitor. I suggested she monitor BP/HR twice a day for the next 5-7 days by taking it about 2 hours after morning BP meds and then once in the evening about the same times each day. She verbalized understanding to monitor BP/HR and call us back with an update.

## 2018-07-08 ENCOUNTER — Other Ambulatory Visit: Payer: Self-pay | Admitting: Internal Medicine

## 2018-07-11 NOTE — Telephone Encounter (Signed)
I spoke with the patient and confirmed she is taking:  1) valsartan-hctz 160/12.5 mg once daily in the morning (~ 8 am)  2) propranolol 60 mg once daily in the morning (~ 8 am)  3) hydralazine 10 mg- 1 tablet by mouth three times a day ( ~ 8 am, 12 pm, & 10 pm)  She is aware I will forward her readings to Dr. Rockey Situ to review and we will call her back with any further recommendations.  She voices understanding and is agreeable.

## 2018-07-11 NOTE — Telephone Encounter (Signed)
Hydralazine 4 hr gap in the am to noon And 10 hr gap for third pill Could try evening pill at 5 to 6 pm Some folks might take hydralazine 4 times a day if needed

## 2018-07-11 NOTE — Telephone Encounter (Signed)
Patient calling back with BP readings :    AM   PM 8/06 124/84  55 143/95  60  8/07 119/81  64 141/89  56  8/08 120/79  59 154/90  52  8/9 128/78  52

## 2018-07-11 NOTE — Telephone Encounter (Signed)
I spoke with the patient and she is aware of Dr. Donivan Scull recommendations to move up her night time dose of hydralazine to around 5/ 6 pm. The patient voices understanding and is agreeable.   She is aware we may be able to add 4 th dose of hydralazine if needed, but she will move up the evening dose to start with.

## 2018-07-16 ENCOUNTER — Ambulatory Visit (INDEPENDENT_AMBULATORY_CARE_PROVIDER_SITE_OTHER): Payer: Medicare Other | Admitting: Nurse Practitioner

## 2018-07-16 ENCOUNTER — Encounter: Payer: Self-pay | Admitting: Nurse Practitioner

## 2018-07-16 VITALS — BP 140/80 | HR 66 | Resp 16 | Ht 63.5 in | Wt 130.0 lb

## 2018-07-16 DIAGNOSIS — I10 Essential (primary) hypertension: Secondary | ICD-10-CM

## 2018-07-16 DIAGNOSIS — R319 Hematuria, unspecified: Secondary | ICD-10-CM

## 2018-07-16 DIAGNOSIS — N390 Urinary tract infection, site not specified: Secondary | ICD-10-CM | POA: Diagnosis not present

## 2018-07-16 DIAGNOSIS — R300 Dysuria: Secondary | ICD-10-CM | POA: Diagnosis not present

## 2018-07-16 LAB — POCT URINALYSIS DIPSTICK
Bilirubin, UA: NEGATIVE
Glucose, UA: NEGATIVE
KETONES UA: NEGATIVE
Leukocytes, UA: NEGATIVE
NITRITE UA: NEGATIVE
PROTEIN UA: NEGATIVE
Spec Grav, UA: 1.01 (ref 1.010–1.025)
Urobilinogen, UA: 0.2 E.U./dL
pH, UA: 7.5 (ref 5.0–8.0)

## 2018-07-16 MED ORDER — CIPROFLOXACIN HCL 500 MG PO TABS: 500.0000 mg | ORAL_TABLET | Freq: Two times a day (BID) | ORAL | 0 refills | Status: DC

## 2018-07-16 NOTE — Progress Notes (Signed)
[facility] [place], [place] [address]  Internal MEDICINE  Office Visit Note  Patient Name: Amy Gilmore  [id number]  [id number]  Date of Service: 07/30/2018  Chief Complaint  Patient presents with  . Urinary Tract Infection    been going on for 2-3wks, symptoms comes and goes, pt sees grey and white flakes in urine, urination frequency     Urinary Tract Infection   This is a new problem. The current episode started in the past 7 days. The problem occurs intermittently. The problem has been unchanged. The quality of the pain is described as aching. The pain is at a severity of 3/10. The pain is mild. There has been no fever. She is not sexually active. There is no history of pyelonephritis. Associated symptoms include flank pain, nausea and urgency. Pertinent negatives include no chills, frequency or vomiting. She has tried acetaminophen for the symptoms.   Pt is here for a sick visit.     Current Medication:  Outpatient Encounter Medications as of 07/16/2018  Medication Sig  . acetaminophen (TYLENOL) 500 MG tablet Take 1,000 mg by mouth every 6 (six) hours as needed. Pain   . aspirin 81 MG tablet Take 81 mg by mouth daily.  Marland Kitchen atorvastatin (LIPITOR) 10 MG tablet Take 1 tablet (10 mg total) by mouth at bedtime.  . Glucosamine 500 MG CAPS Take 500 mg by mouth daily.   . hydrALAZINE (APRESOLINE) 10 MG tablet TAKE 1 TABLET BY MOUTH THREE TIMES DAILY  . Hypromellose (GENTEAL) 0.3 % SOLN Place 1-2 drops into both eyes 2 (two) times daily as needed. Dry eyes   . meclizine (ANTIVERT) 25 MG tablet Take 25 mg by mouth as needed. Inner ear  . meloxicam (MOBIC) 15 MG tablet TAKE 1 TABLET BY MOUTH EVERY DAY AFTER MEALS  . Multiple Vitamins-Minerals (CENTRUM SILVER) tablet Take 1 tablet by mouth daily.   . RESTASIS 0.05 % ophthalmic emulsion INSTILL 1 DROP INTO BOTH EYES TWICE A DAY  . valsartan-hydrochlorothiazide (DIOVAN-HCT) 160-12.5 MG tablet Take 1  tablet by mouth daily.  . [DISCONTINUED] propranolol ER (INDERAL LA) 60 MG 24 hr capsule TAKE 1 CAPSULE BY MOUTH ONCE DAILY  . ciprofloxacin (CIPRO) 500 MG tablet Take 1 tablet (500 mg total) by mouth 2 (two) times daily.   Facility-Administered Encounter Medications as of 07/16/2018  Medication  . 0.9 %  sodium chloride infusion      Medical History: Past Medical History:  Diagnosis Date  . Adenomatous colon polyp 07/03/2012  . Diverticulosis   . Frequency of urination   . History of kidney stones   . Hyperlipidemia   . Hypertension   . Internal hemorrhoids   . Mitral valve prolapse per dr wall note 09/12--  stable  . PAC (premature atrial contraction) followed by cardiologist --  dr wall--  last visit  sept 12  note in epic--  states stable  . Palpitations hx pac's-   occasional  . Renal calculus, right followed by dr Karsten Ro   February 2013  . Right shoulder pain Dec 07, 2011 fall   pain at times with positioning  . Urgency of micturation   . Vertigo, intermittent      Today's Vitals   07/16/18 1218 07/16/18 1249  BP: (!) 170/94 140/80  Pulse: 66   Resp: 16   SpO2: 92%   Weight: 130 lb (59 kg)   Height: 5' 3.5" (1.613 m)    Review of Systems  Constitutional: Negative  for activity change, chills, fatigue and unexpected weight change.  HENT: Negative for congestion, postnasal drip, rhinorrhea, sneezing and sore throat.   Eyes: Negative.  Negative for redness.  Respiratory: Negative for cough, chest tightness, shortness of breath and wheezing.   Cardiovascular: Negative for chest pain and palpitations.       Elevated blood pressure when first arriving in provider office. bp at home is generally 120s/80s  Gastrointestinal: Positive for nausea. Negative for abdominal pain, constipation, diarrhea and vomiting.  Endocrine: Negative for cold intolerance, heat intolerance, polydipsia, polyphagia and polyuria.  Genitourinary: Positive for dysuria, flank pain and urgency.  Negative for frequency.  Musculoskeletal: Negative for arthralgias, back pain, joint swelling and neck pain.  Skin: Negative for rash.  Allergic/Immunologic: Negative for environmental allergies, food allergies and immunocompromised state.  Neurological: Negative for dizziness, tremors, numbness and headaches.  Hematological: Negative for adenopathy. Does not bruise/bleed easily.  Psychiatric/Behavioral: Negative for behavioral problems (Depression), sleep disturbance and suicidal ideas. The patient is not nervous/anxious.     Physical Exam  Constitutional: She is oriented to person, place, and time. She appears well-developed and well-nourished. No distress.  HENT:  Head: Normocephalic and atraumatic.  Nose: Nose normal.  Mouth/Throat: Oropharynx is clear and moist. No oropharyngeal exudate.  Eyes: Pupils are equal, round, and reactive to light. Conjunctivae and EOM are normal.  Neck: Normal range of motion. Neck supple. No JVD present. Carotid bruit is not present. No tracheal deviation present. No thyromegaly present.  Cardiovascular: Normal rate, regular rhythm and normal heart sounds. Exam reveals no gallop and no friction rub.  No murmur heard. Pulmonary/Chest: Effort normal and breath sounds normal. No respiratory distress. She has no wheezes. She has no rales. She exhibits no tenderness.  Abdominal: Soft. Bowel sounds are normal. There is no tenderness.  Genitourinary:  Genitourinary Comments: Urine sample positive for moderate blood.   Musculoskeletal: Normal range of motion.  Lymphadenopathy:    She has no cervical adenopathy.  Neurological: She is alert and oriented to person, place, and time. No cranial nerve deficit.  Skin: Skin is warm and dry. She is not diaphoretic.  Psychiatric: She has a normal mood and affect. Her behavior is normal. Judgment and thought content normal.  Nursing note and vitals reviewed.  Assessment/Plan: 1. Urinary tract infection with hematuria,  site unspecified Start cipro 500mg  bid for 10 days. Send urine for culture and sensitivity and adjust abx as indicated.  - ciprofloxacin (CIPRO) 500 MG tablet; Take 1 tablet (500 mg total) by mouth 2 (two) times daily.  Dispense: 20 tablet; Refill: 0  2. Dysuria - POCT Urinalysis Dipstick - CULTURE, URINE COMPREHENSIVE  3. Essential hypertension Generally stable. Continue bp medication as prescribed.   General Counseling: Shevawn verbalizes understanding of the findings of todays visit and agrees with plan of treatment. I have discussed any further diagnostic evaluation that may be needed or ordered today. We also reviewed her medications today. she has been encouraged to call the office with any questions or concerns that should arise related to todays visit.    Counseling:  This patient was seen by Leretha Pol FNP Collaboration with Dr Lavera Guise as a part of collaborative care agreement  Orders Placed This Encounter  Procedures  . CULTURE, URINE COMPREHENSIVE  . POCT Urinalysis Dipstick    Meds ordered this encounter  Medications  . ciprofloxacin (CIPRO) 500 MG tablet    Sig: Take 1 tablet (500 mg total) by mouth 2 (two) times daily.  Dispense:  20 tablet    Refill:  0    Order Specific Question:   Supervising Provider    Answer:   Lavera Guise [[id number]]    Time spent: 15 Minutes

## 2018-07-18 ENCOUNTER — Ambulatory Visit
Admission: RE | Admit: 2018-07-18 | Discharge: 2018-07-18 | Disposition: A | Discharge status: Home / Self Care | Payer: Medicare Other | Source: Ambulatory Visit | Attending: Internal Medicine | Admitting: Internal Medicine

## 2018-07-18 DIAGNOSIS — Z1231 Encounter for screening mammogram for malignant neoplasm of breast: Secondary | ICD-10-CM

## 2018-07-19 LAB — CULTURE, URINE COMPREHENSIVE

## 2018-07-20 ENCOUNTER — Other Ambulatory Visit: Payer: Self-pay | Admitting: Internal Medicine

## 2018-07-25 DIAGNOSIS — H532 Diplopia: Secondary | ICD-10-CM | POA: Diagnosis not present

## 2018-07-25 DIAGNOSIS — Z961 Presence of intraocular lens: Secondary | ICD-10-CM | POA: Diagnosis not present

## 2018-07-25 DIAGNOSIS — H4911 Fourth [trochlear] nerve palsy, right eye: Secondary | ICD-10-CM | POA: Diagnosis not present

## 2018-07-29 ENCOUNTER — Telehealth: Payer: Self-pay | Admitting: Nurse Practitioner

## 2018-07-30 ENCOUNTER — Other Ambulatory Visit: Payer: Self-pay | Admitting: Nurse Practitioner

## 2018-07-30 DIAGNOSIS — R300 Dysuria: Secondary | ICD-10-CM | POA: Insufficient documentation

## 2018-07-30 DIAGNOSIS — R319 Hematuria, unspecified: Principal | ICD-10-CM

## 2018-07-30 DIAGNOSIS — N390 Urinary tract infection, site not specified: Secondary | ICD-10-CM

## 2018-07-30 MED ORDER — NITROFURANTOIN MONOHYD MACRO 100 MG PO CAPS: 100.0000 mg | ORAL_CAPSULE | Freq: Two times a day (BID) | ORAL | 0 refills | Status: DC

## 2018-07-30 NOTE — Telephone Encounter (Signed)
Informed pt new rx was sent to her pharmacy

## 2018-07-30 NOTE — Progress Notes (Signed)
Patient still having symptom of uti after completing cipro bid for 10 days. Will follow with round of macrobid 100mg  bid for 10 days. New rx was sent to her pharmacy.

## 2018-07-30 NOTE — Telephone Encounter (Signed)
Patient still having symptom of uti after completing cipro bid for 10 days. Will follow with round of macrobid 100mg  bid for 10 days. New rx was sent to her pharmacy.

## 2018-08-11 ENCOUNTER — Encounter: Payer: Self-pay | Admitting: Nurse Practitioner

## 2018-08-11 ENCOUNTER — Ambulatory Visit (INDEPENDENT_AMBULATORY_CARE_PROVIDER_SITE_OTHER): Payer: Medicare Other | Admitting: Nurse Practitioner

## 2018-08-11 VITALS — BP 155/80 | HR 57 | Resp 16 | Ht 63.5 in | Wt 131.0 lb

## 2018-08-11 DIAGNOSIS — Z87442 Personal history of urinary calculi: Secondary | ICD-10-CM | POA: Diagnosis not present

## 2018-08-11 DIAGNOSIS — R310 Gross hematuria: Secondary | ICD-10-CM | POA: Diagnosis not present

## 2018-08-11 DIAGNOSIS — R300 Dysuria: Secondary | ICD-10-CM | POA: Diagnosis not present

## 2018-08-11 DIAGNOSIS — I10 Essential (primary) hypertension: Secondary | ICD-10-CM

## 2018-08-11 DIAGNOSIS — N390 Urinary tract infection, site not specified: Secondary | ICD-10-CM | POA: Diagnosis not present

## 2018-08-11 DIAGNOSIS — R319 Hematuria, unspecified: Secondary | ICD-10-CM

## 2018-08-11 LAB — POCT URINALYSIS DIPSTICK
BILIRUBIN UA: NEGATIVE
Glucose, UA: NEGATIVE
Ketones, UA: NEGATIVE
Leukocytes, UA: NEGATIVE
Nitrite, UA: NEGATIVE
Protein, UA: POSITIVE — AB
SPEC GRAV UA: 1.01 (ref 1.010–1.025)
Urobilinogen, UA: 0.2 E.U./dL
pH, UA: 7.5 (ref 5.0–8.0)

## 2018-08-11 MED ORDER — AMOXICILLIN-POT CLAVULANATE 875-125 MG PO TABS: 1.0000 | ORAL_TABLET | Freq: Two times a day (BID) | ORAL | 0 refills | Status: DC

## 2018-08-11 NOTE — Progress Notes (Signed)
[facility] [place], [place] [address]  Internal MEDICINE  Office Visit Note  Patient Name: Amy Gilmore  [id number]  [id number]  Date of Service: 08/11/2018  Chief Complaint  Patient presents with  . Urinary Tract Infection    still having symptoms after two rounds of antibiotics  . Rash    broke out on legs daughter noticed it yesterday, not really itching just noticable pt believes that it may have came from pills she was taking  . Fatigue    legs are weak     The patient states that she has had to have kidney stones removed two separate times in the past. Both times she saw urlolgist at [facility] urology.  She has finished second round of Macrobid for UTI she was seen for about 3 weeks ago. Symptoms have not changed. Still feeling a lot of pressure in her bladder and is urinating a lot. No flank pain or painful urination are reported at this time.  Her last day of the antibiotic was yesterday. Last night, she noted a fine, red rash on both lower legs, spreading up her legs. Very itchy and irritated.   Urinary Tract Infection   This is a recurrent problem. The current episode started 1 to 4 weeks ago. The problem has been unchanged. The quality of the pain is described as aching. The pain is at a severity of 0/10. The patient is experiencing no pain. There has been no fever. She is not sexually active. There is a history of pyelonephritis. Associated symptoms include flank pain, frequency, hematuria, nausea and urgency. Pertinent negatives include no chills or vomiting. She has tried antibiotics for the symptoms. The treatment provided no relief. Her past medical history is significant for kidney stones.   Pt is here for a sick visit.     Current Medication:  Outpatient Encounter Medications as of 08/11/2018  Medication Sig  . acetaminophen (TYLENOL) 500 MG tablet Take 1,000 mg by mouth every 6 (six) hours as needed. Pain   . aspirin 81 MG  tablet Take 81 mg by mouth daily.  Marland Kitchen atorvastatin (LIPITOR) 10 MG tablet Take 1 tablet (10 mg total) by mouth at bedtime.  . Glucosamine 500 MG CAPS Take 500 mg by mouth daily.   . hydrALAZINE (APRESOLINE) 10 MG tablet TAKE 1 TABLET BY MOUTH THREE TIMES DAILY  . Hypromellose (GENTEAL) 0.3 % SOLN Place 1-2 drops into both eyes 2 (two) times daily as needed. Dry eyes   . meclizine (ANTIVERT) 25 MG tablet Take 25 mg by mouth as needed. Inner ear  . meloxicam (MOBIC) 15 MG tablet TAKE 1 TABLET BY MOUTH EVERY DAY AFTER MEALS  . Multiple Vitamins-Minerals (CENTRUM SILVER) tablet Take 1 tablet by mouth daily.   . propranolol ER (INDERAL LA) 60 MG 24 hr capsule TAKE 1 CAPSULE BY MOUTH ONCE DAILY  . RESTASIS 0.05 % ophthalmic emulsion INSTILL 1 DROP INTO BOTH EYES TWICE A DAY  . valsartan-hydrochlorothiazide (DIOVAN-HCT) 160-12.5 MG tablet Take 1 tablet by mouth daily.  Marland Kitchen amoxicillin-clavulanate (AUGMENTIN) 875-125 MG tablet Take 1 tablet by mouth 2 (two) times daily.  . ciprofloxacin (CIPRO) 500 MG tablet Take 1 tablet (500 mg total) by mouth 2 (two) times daily. (Patient not taking: Reported on 08/11/2018)  . nitrofurantoin, macrocrystal-monohydrate, (MACROBID) 100 MG capsule Take 1 capsule (100 mg total) by mouth 2 (two) times daily. (Patient not taking: Reported on 08/11/2018)   Facility-Administered Encounter Medications as of 08/11/2018  Medication  .  0.9 %  sodium chloride infusion      Medical History: Past Medical History:  Diagnosis Date  . Adenomatous colon polyp 07/03/2012  . Diverticulosis   . Frequency of urination   . History of kidney stones   . Hyperlipidemia   . Hypertension   . Internal hemorrhoids   . Mitral valve prolapse per dr wall note 09/12--  stable  . PAC (premature atrial contraction) followed by cardiologist --  dr wall--  last visit  sept 12  note in epic--  states stable  . Palpitations hx pac's-   occasional  . Renal calculus, right followed by dr Karsten Ro    February 2013  . Right shoulder pain Dec 07, 2011 fall   pain at times with positioning  . Urgency of micturation   . Vertigo, intermittent      Vital Signs: BP (!) 155/80 (BP Location: Right Arm, Patient Position: Sitting, Cuff Size: Normal)   Pulse (!) 57   Resp 16   Ht 5' 3.5" (1.613 m)   Wt 131 lb (59.4 kg)   SpO2 96%   BMI 22.84 kg/m    Review of Systems  Constitutional: Positive for fatigue. Negative for activity change, chills and unexpected weight change.  HENT: Negative for congestion, postnasal drip, rhinorrhea, sneezing and sore throat.   Eyes: Negative.  Negative for redness.  Respiratory: Negative for cough, chest tightness, shortness of breath and wheezing.   Cardiovascular: Negative for chest pain and palpitations.       Elevated blood pressure when first arriving in provider office. bp at home is generally 120s/80s  Gastrointestinal: Positive for nausea. Negative for abdominal pain, constipation, diarrhea and vomiting.  Endocrine: Negative for cold intolerance, heat intolerance, polydipsia, polyphagia and polyuria.  Genitourinary: Positive for dysuria, flank pain, frequency, hematuria and urgency.  Musculoskeletal: Negative for arthralgias, back pain, joint swelling and neck pain.  Skin: Negative for rash.  Allergic/Immunologic: Negative for environmental allergies, food allergies and immunocompromised state.  Neurological: Negative for dizziness, tremors, numbness and headaches.  Hematological: Negative for adenopathy. Does not bruise/bleed easily.  Psychiatric/Behavioral: Negative for behavioral problems (Depression), sleep disturbance and suicidal ideas. The patient is not nervous/anxious.     Physical Exam  Constitutional: She is oriented to person, place, and time. She appears well-developed and well-nourished. No distress.  HENT:  Head: Normocephalic and atraumatic.  Nose: Nose normal.  Mouth/Throat: Oropharynx is clear and moist. No oropharyngeal  exudate.  Eyes: Pupils are equal, round, and reactive to light. Conjunctivae and EOM are normal.  Neck: Normal range of motion. Neck supple. No JVD present. Carotid bruit is not present. No tracheal deviation present. No thyromegaly present.  Cardiovascular: Normal rate, regular rhythm and normal heart sounds. Exam reveals no gallop and no friction rub.  No murmur heard. Pulmonary/Chest: Effort normal and breath sounds normal. No respiratory distress. She has no wheezes. She has no rales. She exhibits no tenderness.  Abdominal: Soft. Bowel sounds are normal. There is no tenderness.  Genitourinary:  Genitourinary Comments: Urine sample is positive for protein and large blood. No CVA tenderness or pelvic tenderness with palpation.   Musculoskeletal: Normal range of motion.  Lymphadenopathy:    She has no cervical adenopathy.  Neurological: She is alert and oriented to person, place, and time. No cranial nerve deficit.  Skin: Skin is warm and dry. She is not diaphoretic.  Psychiatric: She has a normal mood and affect. Her behavior is normal. Judgment and thought content normal.  Nursing note and  vitals reviewed.    Assessment/Plan:  1. Urinary tract infection with hematuria, site unspecified Not improved after two rounds macrobid. Will try round augmentin bid for 10 days. Urine sample sent for culture and sensitivity. Will adjust antibiotic as indicated.  - amoxicillin-clavulanate (AUGMENTIN) 875-125 MG tablet; Take 1 tablet by mouth 2 (two) times daily.  Dispense: 20 tablet; Refill: 0  2. Gross hematuria History of kidney stones which have had to be surgically removed. Refer back to her urologist for further evaluation and treatment.  - Ambulatory referral to Urology  3. Personal history of kidney stones History of kidney stones which have had to be surgically removed. Refer back to her urologist for further evaluation and treatment.    4. Dysuria - POCT Urinalysis Dipstick - CULTURE,  URINE COMPREHENSIVE - Ambulatory referral to Urology - amoxicillin-clavulanate (AUGMENTIN) 875-125 MG tablet; Take 1 tablet by mouth 2 (two) times daily.  Dispense: 20 tablet; Refill: 0  5. Essential hypertension Generally stable. Continue bp medications as prescribed.   General Counseling: Raynell verbalizes understanding of the findings of todays visit and agrees with plan of treatment. I have discussed any further diagnostic evaluation that may be needed or ordered today. We also reviewed her medications today. she has been encouraged to call the office with any questions or concerns that should arise related to todays visit.    Counseling:  This patient was seen by Leretha Pol FNP Collaboration with Dr Lavera Guise as a part of collaborative care agreement   Orders Placed This Encounter  Procedures  . CULTURE, URINE COMPREHENSIVE  . Ambulatory referral to Urology  . POCT Urinalysis Dipstick    Meds ordered this encounter  Medications  . amoxicillin-clavulanate (AUGMENTIN) 875-125 MG tablet    Sig: Take 1 tablet by mouth 2 (two) times daily.    Dispense:  20 tablet    Refill:  0    Order Specific Question:   Supervising Provider    Answer:   Lavera Guise [[id number]]    Time spent: 15 Minutes

## 2018-08-14 LAB — CULTURE, URINE COMPREHENSIVE

## 2018-08-18 DIAGNOSIS — M2021 Hallux rigidus, right foot: Secondary | ICD-10-CM | POA: Diagnosis not present

## 2018-08-18 DIAGNOSIS — M79671 Pain in right foot: Secondary | ICD-10-CM | POA: Diagnosis not present

## 2018-08-18 DIAGNOSIS — L600 Ingrowing nail: Secondary | ICD-10-CM | POA: Diagnosis not present

## 2018-08-22 ENCOUNTER — Other Ambulatory Visit: Payer: Self-pay

## 2018-08-22 ENCOUNTER — Other Ambulatory Visit: Payer: Self-pay | Admitting: Nurse Practitioner

## 2018-08-22 MED ORDER — PROPRANOLOL HCL ER 60 MG PO CP24: 60.0000 mg | ORAL_CAPSULE | Freq: Every day | ORAL | 3 refills | Status: DC

## 2018-08-25 ENCOUNTER — Other Ambulatory Visit: Payer: Self-pay

## 2018-08-25 MED ORDER — HYDRALAZINE HCL 10 MG PO TABS: 10.0000 mg | ORAL_TABLET | Freq: Three times a day (TID) | ORAL | 3 refills | Status: DC

## 2018-09-08 DIAGNOSIS — H4911 Fourth [trochlear] nerve palsy, right eye: Secondary | ICD-10-CM | POA: Diagnosis not present

## 2018-09-08 DIAGNOSIS — H353130 Nonexudative age-related macular degeneration, bilateral, stage unspecified: Secondary | ICD-10-CM | POA: Diagnosis not present

## 2018-09-09 ENCOUNTER — Ambulatory Visit (INDEPENDENT_AMBULATORY_CARE_PROVIDER_SITE_OTHER): Payer: Medicare Other | Admitting: Internal Medicine

## 2018-09-09 ENCOUNTER — Encounter: Payer: Self-pay | Admitting: Internal Medicine

## 2018-09-09 VITALS — BP 158/88 | HR 56 | Resp 16 | Ht 63.0 in | Wt 129.6 lb

## 2018-09-09 DIAGNOSIS — E782 Mixed hyperlipidemia: Secondary | ICD-10-CM

## 2018-09-09 DIAGNOSIS — R319 Hematuria, unspecified: Secondary | ICD-10-CM | POA: Diagnosis not present

## 2018-09-09 DIAGNOSIS — M810 Age-related osteoporosis without current pathological fracture: Secondary | ICD-10-CM

## 2018-09-09 DIAGNOSIS — R300 Dysuria: Secondary | ICD-10-CM | POA: Diagnosis not present

## 2018-09-09 DIAGNOSIS — I10 Essential (primary) hypertension: Secondary | ICD-10-CM | POA: Diagnosis not present

## 2018-09-09 DIAGNOSIS — N390 Urinary tract infection, site not specified: Secondary | ICD-10-CM

## 2018-09-09 DIAGNOSIS — Z0001 Encounter for general adult medical examination with abnormal findings: Secondary | ICD-10-CM | POA: Diagnosis not present

## 2018-09-09 LAB — POCT URINALYSIS DIPSTICK
Bilirubin, UA: NEGATIVE
Glucose, UA: NEGATIVE
Ketones, UA: NEGATIVE
LEUKOCYTES UA: NEGATIVE
NITRITE UA: NEGATIVE
PH UA: 7.5 (ref 5.0–8.0)
PROTEIN UA: NEGATIVE
SPEC GRAV UA: 1.01 (ref 1.010–1.025)
UROBILINOGEN UA: 0.2 U/dL

## 2018-09-09 NOTE — Progress Notes (Signed)
[facility] [place], [place] [address]  Internal MEDICINE  Office Visit Note  Patient Name: Amy Gilmore  [id number]  [id number]  Date of Service: 09/09/2018  Chief Complaint  Patient presents with  . Medicare Wellness    6 month CPE.  Marland Kitchen Hyperlipidemia  . Hypertension  . Chronic Kidney Disease  . Quality Metric Gaps    pt stated she will get her Flu vaccine from her pharmacy  . Urinary Tract Infection    pt wanted urine checked to be cleared of a UTI   HPI Pt is here for routine health maintenance examination, recent UTI and hematuria, will see urology. C/o postnasal drip, denies any sore throat, fever or chills. Pt sees cardiology. Recent TIA with complete resolution of her symptoms. Plays golf on a regular basis  Current Medication: Outpatient Encounter Medications as of 09/09/2018  Medication Sig  . acetaminophen (TYLENOL) 500 MG tablet Take 1,000 mg by mouth every 6 (six) hours as needed. Pain   . aspirin 81 MG tablet Take 81 mg by mouth daily.  Marland Kitchen atorvastatin (LIPITOR) 10 MG tablet Take 1 tablet (10 mg total) by mouth at bedtime.  . Glucosamine 500 MG CAPS Take 500 mg by mouth daily.   . hydrALAZINE (APRESOLINE) 10 MG tablet Take 1 tablet (10 mg total) by mouth 3 (three) times daily.  . Hypromellose (GENTEAL) 0.3 % SOLN Place 1-2 drops into both eyes 2 (two) times daily as needed. Dry eyes   . meclizine (ANTIVERT) 25 MG tablet Take 25 mg by mouth as needed. Inner ear  . meloxicam (MOBIC) 15 MG tablet TAKE 1 TABLET BY MOUTH EVERY DAY AFTER MEALS  . Multiple Vitamins-Minerals (CENTRUM SILVER) tablet Take 1 tablet by mouth daily.   . propranolol ER (INDERAL LA) 60 MG 24 hr capsule Take 1 capsule (60 mg total) by mouth daily.  . RESTASIS 0.05 % ophthalmic emulsion INSTILL 1 DROP INTO BOTH EYES TWICE A DAY  . valsartan-hydrochlorothiazide (DIOVAN-HCT) 160-12.5 MG tablet Take 1 tablet by mouth daily.  Marland Kitchen amoxicillin-clavulanate (AUGMENTIN)  875-125 MG tablet Take 1 tablet by mouth 2 (two) times daily. (Patient not taking: Reported on 09/09/2018)  . ciprofloxacin (CIPRO) 500 MG tablet Take 1 tablet (500 mg total) by mouth 2 (two) times daily. (Patient not taking: Reported on 08/11/2018)  . nitrofurantoin, macrocrystal-monohydrate, (MACROBID) 100 MG capsule Take 1 capsule (100 mg total) by mouth 2 (two) times daily. (Patient not taking: Reported on 08/11/2018)   Facility-Administered Encounter Medications as of 09/09/2018  Medication  . 0.9 %  sodium chloride infusion    Surgical History: Past Surgical History:  Procedure Laterality Date  . APPENDECTOMY  1955  . BUNIONECTOMY  1995   and osteotomy  . COLONOSCOPY  2017  . CYSTOSCOPY  02/22/2012   Procedure: CYSTOSCOPY;  Surgeon: Claybon Jabs, MD;  Location: [facility];  Service: Urology;  Laterality: Right;  removal of ureteral stent  . FLEXIBLE SIGMOIDOSCOPY  08/12/2012   Procedure: FLEXIBLE SIGMOIDOSCOPY;  Surgeon: Inda Castle, MD;  Location: [facility];  Service: Endoscopy;  Laterality: N/A;  . NEPHROLITHOTOMY  01/28/2012-  right ureteral stent placement   Procedure: NEPHROLITHOTOMY PERCUTANEOUS;  Surgeon: Claybon Jabs, MD;  Location: [facility];  Service: Urology;  Laterality: Right;  . OTHER SURGICAL HISTORY  01/28/12   right percutaneous nephroliathothoma  . REFRACTIVE SURGERY  yrs ago  . Elizabeth and 2006   right  . URETEROSCOPY  02/22/2012   Procedure: URETEROSCOPY;  Surgeon: Claybon Jabs, MD;  Location: [facility];  Service: Urology;  Laterality: Right;  URETEROSCOPY WITH STONE EXTRACTION   . VAGINAL HYSTERECTOMY  1985    Medical History: Past Medical History:  Diagnosis Date  . Adenomatous colon polyp 07/03/2012  . Diverticulosis   . Frequency of urination   . History of kidney stones   . Hyperlipidemia   . Hypertension   . Internal hemorrhoids   . Mitral valve prolapse per dr wall note 09/12--  stable  . PAC  (premature atrial contraction) followed by cardiologist --  dr wall--  last visit  sept 12  note in epic--  states stable  . Palpitations hx pac's-   occasional  . Renal calculus, right followed by dr Karsten Ro   February 2013  . Right shoulder pain Dec 07, 2011 fall   pain at times with positioning  . Urgency of micturation   . Vertigo, intermittent     Family History: Family History  Problem Relation Age of Onset  . Liver cancer Mother   . Heart attack Sister   . Peripheral vascular disease Sister   . Colon cancer Neg Hx   . Breast cancer Neg Hx    Review of Systems  Constitutional: Negative for chills, diaphoresis and fatigue.  HENT: Positive for postnasal drip. Negative for ear pain and sinus pressure.   Eyes: Negative for photophobia, discharge, redness, itching and visual disturbance.  Respiratory: Negative for cough, shortness of breath and wheezing.   Cardiovascular: Negative for chest pain, palpitations and leg swelling.  Gastrointestinal: Negative for abdominal pain, constipation, diarrhea, nausea and vomiting.  Genitourinary: Negative for dysuria and flank pain.  Musculoskeletal: Negative for arthralgias, back pain, gait problem and neck pain.  Skin: Negative for color change.  Allergic/Immunologic: Negative for environmental allergies and food allergies.  Neurological: Negative for dizziness and headaches.  Hematological: Does not bruise/bleed easily.  Psychiatric/Behavioral: Negative for agitation, behavioral problems (depression) and hallucinations.   Vital Signs: BP (!) 158/88 (BP Location: Right Arm, Patient Position: Sitting, Cuff Size: Normal)   Pulse (!) 56   Resp 16   Ht 5\' 3"  (1.6 m)   Wt 129 lb 9.6 oz (58.8 kg)   SpO2 98%   BMI 22.96 kg/m    Physical Exam  Constitutional: She is oriented to person, place, and time. She appears well-developed and well-nourished. No distress.  HENT:  Head: Normocephalic and atraumatic.  Mouth/Throat: Oropharynx is  clear and moist. No oropharyngeal exudate.  Eyes: Pupils are equal, round, and reactive to light. EOM are normal.  Neck: Normal range of motion. Neck supple. No JVD present. No tracheal deviation present. No thyromegaly present.  Cardiovascular: Normal rate, regular rhythm and normal heart sounds. Exam reveals no gallop and no friction rub.  No murmur heard. Pulmonary/Chest: Effort normal. No respiratory distress. She has no wheezes. She has no rales. She exhibits no tenderness. Right breast exhibits no mass and no tenderness. Left breast exhibits no mass and no tenderness. No breast tenderness.  Abdominal: Soft. Bowel sounds are normal.  Musculoskeletal: Normal range of motion.  Lymphadenopathy:    She has no cervical adenopathy.  Neurological: She is alert and oriented to person, place, and time. No cranial nerve deficit.  Skin: Skin is warm and dry. She is not diaphoretic.  Psychiatric: She has a normal mood and affect. Her behavior is normal. Judgment and thought content normal.   LABS: Recent Results (from the past  2160 hour(s))  POCT Urinalysis Dipstick     Status: None   Collection Time: 07/16/18 12:35 PM  Result Value Ref Range   Color, UA     Clarity, UA     Glucose, UA Negative Negative   Bilirubin, UA Negative    Ketones, UA Negative    Spec Grav, UA 1.010 1.010 - 1.025   Blood, UA mooderate    pH, UA 7.5 5.0 - 8.0   Protein, UA Negative Negative   Urobilinogen, UA 0.2 0.2 or 1.0 E.U./dL   Nitrite, UA Negative    Leukocytes, UA Negative Negative   Appearance     Odor    CULTURE, URINE COMPREHENSIVE     Status: Abnormal   Collection Time: 07/16/18  3:35 PM  Result Value Ref Range   Urine Culture, Comprehensive Final report (A)    Organism ID, Bacteria Proteus mirabilis (A)   POCT Urinalysis Dipstick     Status: Abnormal   Collection Time: 08/11/18  3:03 PM  Result Value Ref Range   Color, UA     Clarity, UA     Glucose, UA Negative Negative   Bilirubin, UA  negative    Ketones, UA negative    Spec Grav, UA 1.010 1.010 - 1.025   Blood, UA large    pH, UA 7.5 5.0 - 8.0   Protein, UA Positive (A) Negative   Urobilinogen, UA 0.2 0.2 or 1.0 E.U./dL   Nitrite, UA negative    Leukocytes, UA Negative Negative   Appearance     Odor    CULTURE, URINE COMPREHENSIVE     Status: None   Collection Time: 08/11/18  3:13 PM  Result Value Ref Range   Urine Culture, Comprehensive Final report    Organism ID, Bacteria Comment     Comment: Mixed urogenital flora 4,000 Colonies/mL   POCT Urinalysis Dipstick     Status: None   Collection Time: 09/09/18 10:55 AM  Result Value Ref Range   Color, UA     Clarity, UA     Glucose, UA Negative Negative   Bilirubin, UA negative    Ketones, UA negative    Spec Grav, UA 1.010 1.010 - 1.025   Blood, UA moderate    pH, UA 7.5 5.0 - 8.0   Protein, UA Negative Negative   Urobilinogen, UA 0.2 0.2 or 1.0 E.U./dL   Nitrite, UA negative    Leukocytes, UA Negative Negative   Appearance     Odor     Assessment/Plan: 1. Encounter for general adult medical examination with abnormal findings - Pt is up to date on her PHM, labs and mammogram is reviewed   2. Essential hypertension, benign - Slightly elevated today, however home readings are normal  3. Osteoporosis, senile - DG Bone Density; Future  4. Urinary tract infection with hematuria, site unspecified - Will need to see urology - UA/M w/rflx Culture, Routine - POCT Urinalysis Dipstick  5. Mixed hyperlipidemia - Controlled with meds   General Counseling: Nadine verbalizes understanding of the findings of todays visit and agrees with plan of treatment. I have discussed any further diagnostic evaluation that may be needed or ordered today. We also reviewed her medications today. she has been encouraged to call the office with any questions or concerns that should arise related to todays visit.   Orders Placed This Encounter  Procedures  . DG Bone  Density  . UA/M w/rflx Culture, Routine  . POCT Urinalysis Dipstick  Time spent: [place], MD  Internal Medicine

## 2018-09-10 ENCOUNTER — Ambulatory Visit: Payer: Self-pay | Admitting: Adult Health

## 2018-09-10 LAB — UA/M W/RFLX CULTURE, ROUTINE
Bilirubin, UA: NEGATIVE
Glucose, UA: NEGATIVE
KETONES UA: NEGATIVE
LEUKOCYTES UA: NEGATIVE
Nitrite, UA: NEGATIVE
PH UA: 7 (ref 5.0–7.5)
Protein, UA: NEGATIVE
RBC, UA: NEGATIVE
SPEC GRAV UA: 1.013 (ref 1.005–1.030)
UUROB: 0.2 mg/dL (ref 0.2–1.0)

## 2018-09-10 LAB — MICROSCOPIC EXAMINATION
Bacteria, UA: NONE SEEN
CASTS: NONE SEEN /LPF
EPITHELIAL CELLS (NON RENAL): NONE SEEN /HPF (ref 0–10)
WBC, UA: NONE SEEN /hpf (ref 0–5)

## 2018-09-26 ENCOUNTER — Ambulatory Visit: Payer: Self-pay | Admitting: Urology

## 2018-10-01 DIAGNOSIS — Z23 Encounter for immunization: Secondary | ICD-10-CM | POA: Diagnosis not present

## 2018-11-03 ENCOUNTER — Ambulatory Visit: Payer: Self-pay | Admitting: Urology

## 2018-11-03 ENCOUNTER — Telehealth: Payer: Self-pay

## 2018-11-03 ENCOUNTER — Other Ambulatory Visit: Payer: Self-pay

## 2018-11-03 NOTE — Telephone Encounter (Signed)
Faxed last note and labs to dr Edwena Blow

## 2018-11-12 ENCOUNTER — Ambulatory Visit
Admission: RE | Admit: 2018-11-12 | Discharge: 2018-11-12 | Disposition: A | Discharge status: Home / Self Care | Payer: Medicare Other | Source: Ambulatory Visit | Attending: Internal Medicine | Admitting: Internal Medicine

## 2018-11-12 DIAGNOSIS — Z780 Asymptomatic menopausal state: Secondary | ICD-10-CM | POA: Diagnosis not present

## 2018-11-12 DIAGNOSIS — M810 Age-related osteoporosis without current pathological fracture: Secondary | ICD-10-CM | POA: Insufficient documentation

## 2018-11-13 ENCOUNTER — Telehealth: Payer: Self-pay | Admitting: Internal Medicine

## 2018-11-13 DIAGNOSIS — Z87442 Personal history of urinary calculi: Secondary | ICD-10-CM | POA: Diagnosis not present

## 2018-11-13 DIAGNOSIS — R3121 Asymptomatic microscopic hematuria: Secondary | ICD-10-CM | POA: Diagnosis not present

## 2018-11-13 NOTE — Telephone Encounter (Signed)
Spoke with patient about results from her bone density and she states that she does not want to see a rheumatologist, that she has this covered .

## 2018-11-13 NOTE — Progress Notes (Signed)
Please advise pt she has more bone loss than before, I would like her to see a rheumatologist to discuss meds options

## 2018-11-13 NOTE — Telephone Encounter (Signed)
Left message for patient to call back  

## 2018-11-14 DIAGNOSIS — R3121 Asymptomatic microscopic hematuria: Secondary | ICD-10-CM | POA: Diagnosis not present

## 2018-11-14 DIAGNOSIS — N281 Cyst of kidney, acquired: Secondary | ICD-10-CM | POA: Diagnosis not present

## 2018-12-02 DIAGNOSIS — N281 Cyst of kidney, acquired: Secondary | ICD-10-CM | POA: Diagnosis not present

## 2018-12-02 DIAGNOSIS — R3121 Asymptomatic microscopic hematuria: Secondary | ICD-10-CM | POA: Diagnosis not present

## 2018-12-08 ENCOUNTER — Other Ambulatory Visit: Payer: Self-pay

## 2018-12-08 MED ORDER — PROPRANOLOL HCL ER 60 MG PO CP24: 60.0000 mg | ORAL_CAPSULE | Freq: Every day | ORAL | 3 refills | Status: DC

## 2019-01-22 ENCOUNTER — Other Ambulatory Visit: Payer: Self-pay

## 2019-01-22 MED ORDER — HYDRALAZINE HCL 10 MG PO TABS: 10.0000 mg | ORAL_TABLET | Freq: Three times a day (TID) | ORAL | 3 refills | Status: DC

## 2019-03-24 ENCOUNTER — Ambulatory Visit (INDEPENDENT_AMBULATORY_CARE_PROVIDER_SITE_OTHER): Payer: Medicare Other | Admitting: Internal Medicine

## 2019-03-24 ENCOUNTER — Other Ambulatory Visit: Payer: Self-pay

## 2019-03-24 ENCOUNTER — Encounter: Payer: Self-pay | Admitting: Internal Medicine

## 2019-03-24 VITALS — BP 128/84 | HR 63 | Resp 16 | Ht 63.0 in | Wt 126.0 lb

## 2019-03-24 DIAGNOSIS — M810 Age-related osteoporosis without current pathological fracture: Secondary | ICD-10-CM | POA: Diagnosis not present

## 2019-03-24 DIAGNOSIS — I10 Essential (primary) hypertension: Secondary | ICD-10-CM | POA: Diagnosis not present

## 2019-03-24 DIAGNOSIS — R040 Epistaxis: Secondary | ICD-10-CM | POA: Diagnosis not present

## 2019-03-24 DIAGNOSIS — E782 Mixed hyperlipidemia: Secondary | ICD-10-CM | POA: Diagnosis not present

## 2019-03-24 MED ORDER — IBANDRONATE SODIUM 150 MG PO TABS: 150.0000 mg | ORAL_TABLET | ORAL | 1 refills | Status: DC

## 2019-03-24 NOTE — Progress Notes (Signed)
[facility] [address], [place] [address]  Internal MEDICINE  Telephone Visit  Patient Name: Amy Gilmore  [id number]  [id number]  Date of Service: 03/24/2019  I connected with the patient at 1000 by telephone and verified the patients identity using two identifiers.   I discussed the limitations, risks, security and privacy concerns of performing an evaluation and management service by telephone and the availability of in person appointments. I also discussed with the patient that there may be a patient responsible charge related to the service.  The patient expressed understanding and agrees to proceed.    Chief Complaint  Patient presents with  . Telephone Assessment  . Telephone Screen  . Hyperlipidemia  . Hypertension  . Epistaxis    going on since march    HPI Pt is connected through webcam to prevent the pandemic of covid 19. She is to her baseline 1. C/O nose bleeds and it is profuse in nature, she denies use of any nasal spray. 2. Pt is taking calcium and vit D for osteoporosis, she was suppose to get in touch with Rheumotology for possible bi-phosphonate infusion 3. Does she Cardiology and urology on a regular basis  4. BP is slightly elevated, she does take all her medications as prescribed   Current Medication: Outpatient Encounter Medications as of 03/24/2019  Medication Sig  . acetaminophen (TYLENOL) 500 MG tablet Take 1,000 mg by mouth every 6 (six) hours as needed. Pain   . aspirin 81 MG tablet Take 81 mg by mouth daily.  Marland Kitchen atorvastatin (LIPITOR) 10 MG tablet Take 1 tablet (10 mg total) by mouth at bedtime.  . Glucosamine 500 MG CAPS Take 500 mg by mouth daily.   . hydrALAZINE (APRESOLINE) 10 MG tablet Take 1 tablet (10 mg total) by mouth 3 (three) times daily.  . Hypromellose (GENTEAL) 0.3 % SOLN Place 1-2 drops into both eyes 2 (two) times daily as needed. Dry eyes   . meclizine (ANTIVERT) 25 MG tablet Take 25 mg by mouth as  needed. Inner ear  . meloxicam (MOBIC) 15 MG tablet TAKE 1 TABLET BY MOUTH EVERY DAY AFTER MEALS  . Multiple Vitamins-Minerals (CENTRUM SILVER) tablet Take 1 tablet by mouth daily.   . propranolol ER (INDERAL LA) 60 MG 24 hr capsule Take 1 capsule (60 mg total) by mouth daily.  . RESTASIS 0.05 % ophthalmic emulsion INSTILL 1 DROP INTO BOTH EYES TWICE A DAY  . valsartan-hydrochlorothiazide (DIOVAN-HCT) 160-12.5 MG tablet Take 1 tablet by mouth daily.  Marland Kitchen ibandronate (BONIVA) 150 MG tablet Take 1 tablet (150 mg total) by mouth every 30 (thirty) days. Take in the morning with a full glass of water, on an empty stomach, and do not take anything else by mouth or lie down for the next 30 min.  . [DISCONTINUED] amoxicillin-clavulanate (AUGMENTIN) 875-125 MG tablet Take 1 tablet by mouth 2 (two) times daily. (Patient not taking: Reported on 09/09/2018)  . [DISCONTINUED] ciprofloxacin (CIPRO) 500 MG tablet Take 1 tablet (500 mg total) by mouth 2 (two) times daily. (Patient not taking: Reported on 08/11/2018)  . [DISCONTINUED] nitrofurantoin, macrocrystal-monohydrate, (MACROBID) 100 MG capsule Take 1 capsule (100 mg total) by mouth 2 (two) times daily. (Patient not taking: Reported on 08/11/2018)   Facility-Administered Encounter Medications as of 03/24/2019  Medication  . 0.9 %  sodium chloride infusion    Surgical History: Past Surgical History:  Procedure Laterality Date  . APPENDECTOMY  1955  . [place]  and osteotomy  . COLONOSCOPY  2017  . CYSTOSCOPY  02/22/2012   Procedure: CYSTOSCOPY;  Surgeon: Claybon Jabs, MD;  Location:  [facility];  Service: Urology;  Laterality: Right;  removal of ureteral stent  . FLEXIBLE SIGMOIDOSCOPY  08/12/2012   Procedure: FLEXIBLE SIGMOIDOSCOPY;  Surgeon: Inda Castle, MD;  Location: [facility];  Service: Endoscopy;  Laterality: N/A;  . NEPHROLITHOTOMY  01/28/2012-  right ureteral stent placement   Procedure: NEPHROLITHOTOMY PERCUTANEOUS;   Surgeon: Claybon Jabs, MD;  Location: [facility];  Service: Urology;  Laterality: Right;  . OTHER SURGICAL HISTORY  01/28/12   right percutaneous nephroliathothoma  . REFRACTIVE SURGERY  yrs ago  . [place] and 2006   right  . URETEROSCOPY  02/22/2012   Procedure: URETEROSCOPY;  Surgeon: Claybon Jabs, MD;  Location: [facility];  Service: Urology;  Laterality: Right;  URETEROSCOPY WITH STONE EXTRACTION   . VAGINAL HYSTERECTOMY  1985    Medical History: Past Medical History:  Diagnosis Date  . Adenomatous colon polyp 07/03/2012  . Diverticulosis   . Frequency of urination   . History of kidney stones   . Hyperlipidemia   . Hypertension   . Internal hemorrhoids   . Mitral valve prolapse per dr wall note 09/12--  stable  . PAC (premature atrial contraction) followed by cardiologist --  dr wall--  last visit  sept 12  note in epic--  states stable  . Palpitations hx pac's-   occasional  . Renal calculus, right followed by dr Karsten Ro   February 2013  . Right shoulder pain Dec 07, 2011 fall   pain at times with positioning  . Urgency of micturation   . Vertigo, intermittent     Family History: Family History  Problem Relation Age of Onset  . Liver cancer Mother   . Heart attack Sister   . Peripheral vascular disease Sister   . Colon cancer Neg Hx   . Breast cancer Neg Hx     Social History   Socioeconomic History  . Marital status: Married    Spouse name: Not on file  . Number of children: 3  . Years of education: Not on file  . Highest education level: Not on file  Occupational History  . Occupation: Housewife  Social Needs  . Financial resource strain: Not on file  . Food insecurity:    Worry: Not on file    Inability: Not on file  . Transportation needs:    Medical: Not on file    Non-medical: Not on file  Tobacco Use  . Smoking status: Former Smoker    Packs/day: 1.00    Years: 15.00    Pack years: 15.00    Last attempt to  quit: 12/04/1975    Years since quitting: 43.3  . Smokeless tobacco: Never Used  Substance and Sexual Activity  . Alcohol use: Yes    Alcohol/week: 7.0 standard drinks    Types: 7 Glasses of wine per week    Comment: Socially  . Drug use: No  . Sexual activity: Not on file  Lifestyle  . Physical activity:    Days per week: Not on file    Minutes per session: Not on file  . Stress: Not on file  Relationships  . Social connections:    Talks on phone: Not on file    Gets together: Not on file    Attends religious service: Not on file  Active member of club or organization: Not on file    Attends meetings of clubs or organizations: Not on file    Relationship status: Not on file  . Intimate partner violence:    Fear of current or ex partner: Not on file    Emotionally abused: Not on file    Physically abused: Not on file    Forced sexual activity: Not on file  Other Topics Concern  . Not on file  Social History Narrative   Married.   Gets regular exercise.   Caffeine daily. 3 cups of coffee or soda per day      Review of Systems  Constitutional: Negative.   HENT: Positive for nosebleeds. Negative for congestion.   Respiratory: Negative.   Cardiovascular: Positive for chest pain and leg swelling.  Gastrointestinal: Negative.   Endocrine: Negative.   Genitourinary: Negative.   Musculoskeletal: Negative.     Vital Signs: BP 128/84   Pulse 63   Resp 16   Ht 5\' 3"  (1.6 m)   Wt 126 lb (57.2 kg)   BMI 22.32 kg/m    Observation/Objective: She is seen through webcam, NAD, pleasant to talk to, keeps an eye on her blood pressure, slight confusion about the treatment of osteoporosis however will address on next visit   Assessment/Plan: 1. Epistaxis - instructed to get saline nasal spray  - Ambulatory referral to ENT  2. Essential hypertension, benign - Controlled   3. Osteoporosis, senile - Will like her to start Boniva, continue calcium and vitd   4. Mixed  hyperlipidemia - Controlled with meds   General Counseling: Shevy verbalizes understanding of the findings of today's phone visit and agrees with plan of treatment. I have discussed any further diagnostic evaluation that may be needed or ordered today. We also reviewed her medications today. she has been encouraged to call the office with any questions or concerns that should arise related to todays visit.  Time spent: 58 Minutes  Dr Lavera Guise Internal medicine

## 2019-03-30 ENCOUNTER — Other Ambulatory Visit: Payer: Self-pay

## 2019-03-30 MED ORDER — PROPRANOLOL HCL ER 60 MG PO CP24: 60.0000 mg | ORAL_CAPSULE | Freq: Every day | ORAL | 3 refills | Status: DC

## 2019-04-17 ENCOUNTER — Other Ambulatory Visit: Payer: Self-pay | Admitting: Internal Medicine

## 2019-05-26 ENCOUNTER — Other Ambulatory Visit: Payer: Self-pay

## 2019-05-26 ENCOUNTER — Ambulatory Visit (INDEPENDENT_AMBULATORY_CARE_PROVIDER_SITE_OTHER): Payer: Medicare Other | Admitting: Internal Medicine

## 2019-05-26 ENCOUNTER — Encounter: Payer: Self-pay | Admitting: Internal Medicine

## 2019-05-26 VITALS — BP 178/92 | HR 61 | Resp 16 | Ht 63.0 in | Wt 127.6 lb

## 2019-05-26 DIAGNOSIS — R001 Bradycardia, unspecified: Secondary | ICD-10-CM | POA: Diagnosis not present

## 2019-05-26 DIAGNOSIS — I10 Essential (primary) hypertension: Secondary | ICD-10-CM | POA: Diagnosis not present

## 2019-05-26 DIAGNOSIS — T50905A Adverse effect of unspecified drugs, medicaments and biological substances, initial encounter: Secondary | ICD-10-CM

## 2019-05-26 MED ORDER — PROPRANOLOL HCL 10 MG PO TABS: ORAL_TABLET | ORAL | 3 refills | Status: DC

## 2019-05-26 NOTE — Progress Notes (Signed)
[facility] [place],  [address]  Internal MEDICINE  Office Visit Note  Patient Name: Amy Gilmore  [id number]  [id number]  Date of Service: 05/28/2019  Chief Complaint  Patient presents with  . Hypertension  . Hyperlipidemia    HPI  Pt is here for routine follow up, concerned about her elevated bp and labile numbers with some high and some lows, her pulse is slow as well. She does see a cardiologist, H/o MVP and has been maintained on Inderal LA over 20 years. Her heart rate is on the low side . She is on multiple antihypertensives  Recently her hydralazine was increased   Current Medication: Outpatient Encounter Medications as of 05/26/2019  Medication Sig  . acetaminophen (TYLENOL) 500 MG tablet Take 1,000 mg by mouth every 6 (six) hours as needed. Pain   . aspirin 81 MG tablet Take 81 mg by mouth daily.  Marland Kitchen atorvastatin (LIPITOR) 10 MG tablet TAKE 1 TABLET(10 MG) BY MOUTH AT BEDTIME  . Glucosamine 500 MG CAPS Take 500 mg by mouth daily.   . hydrALAZINE (APRESOLINE) 10 MG tablet Take 1 tablet (10 mg total) by mouth 3 (three) times daily.  . Hypromellose (GENTEAL) 0.3 % SOLN Place 1-2 drops into both eyes 2 (two) times daily as needed. Dry eyes   . ibandronate (BONIVA) 150 MG tablet Take 1 tablet (150 mg total) by mouth every 30 (thirty) days. Take in the morning with a full glass of water, on an empty stomach, and do not take anything else by mouth or lie down for the next 30 min.  . meclizine (ANTIVERT) 25 MG tablet Take 25 mg by mouth as needed. Inner ear  . meloxicam (MOBIC) 15 MG tablet TAKE 1 TABLET BY MOUTH EVERY DAY AFTER MEALS  . Multiple Vitamins-Minerals (CENTRUM SILVER) tablet Take 1 tablet by mouth daily.   . propranolol ER (INDERAL LA) 60 MG 24 hr capsule Take 1 capsule (60 mg total) by mouth daily.  . RESTASIS 0.05 % ophthalmic emulsion INSTILL 1 DROP INTO BOTH EYES TWICE A DAY  . valsartan-hydrochlorothiazide (DIOVAN-HCT)  160-12.5 MG tablet TAKE 1 TABLET BY MOUTH DAILY  . propranolol (INDERAL) 10 MG tablet Take one tab po tid for bp sys greater than 140 prn   Facility-Administered Encounter Medications as of 05/26/2019  Medication  . 0.9 %  sodium chloride infusion    Surgical History: Past Surgical History:  Procedure Laterality Date  . APPENDECTOMY  1955  . BUNIONECTOMY  1995   and osteotomy  . COLONOSCOPY  2017  . CYSTOSCOPY  02/22/2012   Procedure: CYSTOSCOPY;  Surgeon: Claybon Jabs, MD;  Location: [facility];  Service: Urology;  Laterality: Right;  removal of ureteral stent  . FLEXIBLE SIGMOIDOSCOPY  08/12/2012   Procedure: FLEXIBLE SIGMOIDOSCOPY;  Surgeon: Inda Castle, MD;  Location: [facility];  Service: Endoscopy;  Laterality: N/A;  . NEPHROLITHOTOMY  01/28/2012-  right ureteral stent placement   Procedure: NEPHROLITHOTOMY PERCUTANEOUS;  Surgeon: Claybon Jabs, MD;  Location: [facility];  Service: Urology;  Laterality: Right;  . OTHER SURGICAL HISTORY  01/28/12   right percutaneous nephroliathothoma  . REFRACTIVE SURGERY  yrs ago  . [place] and 2006   right  . URETEROSCOPY  02/22/2012   Procedure: URETEROSCOPY;  Surgeon: Claybon Jabs, MD;  Location: [facility];  Service: Urology;  Laterality: Right;  URETEROSCOPY WITH STONE EXTRACTION   . VAGINAL HYSTERECTOMY  1985    Medical History: Past Medical History:  Diagnosis Date  . Adenomatous colon polyp 07/03/2012  . Diverticulosis   . Frequency of urination   . History of kidney stones   . Hyperlipidemia   . Hypertension   . Internal hemorrhoids   . Mitral valve prolapse per dr wall note 09/12--  stable  . PAC (premature atrial contraction) followed by cardiologist --  dr wall--  last visit  sept 12  note in epic--  states stable  . Palpitations hx pac's-   occasional  . Renal calculus, right followed by dr Karsten Ro   February 2013  . Right shoulder pain Dec 07, 2011 fall   pain at  times with positioning  . Urgency of micturation   . Vertigo, intermittent     Family History: Family History  Problem Relation Age of Onset  . Liver cancer Mother   . Heart attack Sister   . Peripheral vascular disease Sister   . Colon cancer Neg Hx   . Breast cancer Neg Hx     Social History   Socioeconomic History  . Marital status: Married    Spouse name: Not on file  . Number of children: 3  . Years of education: Not on file  . Highest education level: Not on file  Occupational History  . Occupation: Housewife  Social Needs  . Financial resource strain: Not on file  . Food insecurity    Worry: Not on file    Inability: Not on file  . Transportation needs    Medical: Not on file    Non-medical: Not on file  Tobacco Use  . Smoking status: Former Smoker    Packs/day: 1.00    Years: 15.00    Pack years: 15.00    Quit date: 12/04/1975    Years since quitting: 43.5  . Smokeless tobacco: Never Used  Substance and Sexual Activity  . Alcohol use: Yes    Alcohol/week: 7.0 standard drinks    Types: 7 Glasses of wine per week    Comment: Socially  . Drug use: No  . Sexual activity: Not on file  Lifestyle  . Physical activity    Days per week: Not on file    Minutes per session: Not on file  . Stress: Not on file  Relationships  . Social Herbalist on phone: Not on file    Gets together: Not on file    Attends religious service: Not on file    Active member of club or organization: Not on file    Attends meetings of clubs or organizations: Not on file    Relationship status: Not on file  . Intimate partner violence    Fear of current or ex partner: Not on file    Emotionally abused: Not on file    Physically abused: Not on file    Forced sexual activity: Not on file  Other Topics Concern  . Not on file  Social History Narrative   Married.   Gets regular exercise.   Caffeine daily. 3 cups of coffee or soda per day   Review of Systems   Constitutional: Negative.   HENT: Negative.   Respiratory: Negative for cough and shortness of breath.   Cardiovascular: Negative for chest pain and palpitations.       Elevated and labile BP   Gastrointestinal: Negative.   Genitourinary: Negative.   Allergic/Immunologic: Negative.   Neurological: Negative.  Negative for speech difficulty.  Psychiatric/Behavioral: Negative.     Vital Signs: BP (!) 178/92 (BP Location: Left Arm, Patient Position: Sitting, Cuff Size: Small) Comment: 1st reading 180/102  Pulse 61   Resp 16   Ht 5\' 3"  (1.6 m)   Wt 127 lb 9.6 oz (57.9 kg)   SpO2 98%   BMI 22.60 kg/m   Physical Exam Constitutional:      General: She is not in acute distress.    Appearance: Normal appearance. She is normal weight.  HENT:     Head: Normocephalic and atraumatic.     Nose: Nose normal.  Eyes:     Extraocular Movements: Extraocular movements intact.     Pupils: Pupils are equal, round, and reactive to light.  Neck:     Musculoskeletal: Normal range of motion.  Cardiovascular:     Rate and Rhythm: Regular rhythm. Bradycardia present.     Pulses: Normal pulses.  Pulmonary:     Effort: Pulmonary effort is normal.  Skin:    General: Skin is warm and dry.  Neurological:     General: No focal deficit present.     Mental Status: She is alert and oriented to person, place, and time.     Motor: No weakness.  Psychiatric:        Mood and Affect: Mood normal.        Behavior: Behavior normal.        Thought Content: Thought content normal.        Judgment: Judgment normal.    Assessment/Plan: 1. Labile essential hypertension - Needs some modification in her therapy, continue Diovan/hctz, Hydralazine as before, will titratte if needed   2. Drug-induced bradycardia - DC Inderal LA, will give her short acting propranolol prn 10 mg tid, will monitor closely   General Counseling: Leona verbalizes understanding of the findings of todays visit and agrees with plan  of treatment. I have discussed any further diagnostic evaluation that may be needed or ordered today. We also reviewed her medications today. she has been encouraged to call the office with any questions or concerns that should arise related to todays visit.    Meds ordered this encounter  Medications  . propranolol (INDERAL) 10 MG tablet    Sig: Take one tab po tid for bp sys greater than 140 prn    Dispense:  90 tablet    Refill:  3    Time spent:25 Minutes   Dr Lavera Guise Internal medicine

## 2019-05-26 NOTE — Progress Notes (Signed)
Pt blood pressure elevated, taken twice 1st reading 180/102 2nd reading 178/92 Informed provider

## 2019-06-17 DIAGNOSIS — L600 Ingrowing nail: Secondary | ICD-10-CM | POA: Diagnosis not present

## 2019-06-17 DIAGNOSIS — M2021 Hallux rigidus, right foot: Secondary | ICD-10-CM | POA: Diagnosis not present

## 2019-06-19 ENCOUNTER — Other Ambulatory Visit: Payer: Self-pay | Admitting: Internal Medicine

## 2019-06-19 DIAGNOSIS — Z1231 Encounter for screening mammogram for malignant neoplasm of breast: Secondary | ICD-10-CM

## 2019-06-23 ENCOUNTER — Encounter: Payer: Self-pay | Admitting: Internal Medicine

## 2019-06-23 ENCOUNTER — Other Ambulatory Visit: Payer: Self-pay

## 2019-06-23 ENCOUNTER — Ambulatory Visit (INDEPENDENT_AMBULATORY_CARE_PROVIDER_SITE_OTHER): Payer: Medicare Other | Admitting: Internal Medicine

## 2019-06-23 DIAGNOSIS — Z1231 Encounter for screening mammogram for malignant neoplasm of breast: Secondary | ICD-10-CM | POA: Diagnosis not present

## 2019-06-23 DIAGNOSIS — I10 Essential (primary) hypertension: Secondary | ICD-10-CM | POA: Diagnosis not present

## 2019-06-23 DIAGNOSIS — M810 Age-related osteoporosis without current pathological fracture: Secondary | ICD-10-CM | POA: Diagnosis not present

## 2019-06-23 DIAGNOSIS — R001 Bradycardia, unspecified: Secondary | ICD-10-CM

## 2019-06-23 MED ORDER — VALSARTAN-HYDROCHLOROTHIAZIDE 160-12.5 MG PO TABS: ORAL_TABLET | ORAL | 2 refills | Status: DC

## 2019-06-23 NOTE — Progress Notes (Signed)
[facility] [place], [place] [address]  Internal MEDICINE  Office Visit Note  Patient Name: Amy Gilmore  [id number]  [id number]  Date of Service: 07/07/2019  Chief Complaint  Patient presents with  . Medical Management of Chronic Issues    4 week follow up   . Hypertension    HPI Pt is here for routine follow up, Her Propranolol was decreased due to bradycardia, bp is better on increased dose of Diovan/hctz, no other problems. Denies any c/p or sob   Current Medication: Outpatient Encounter Medications as of 06/23/2019  Medication Sig  . acetaminophen (TYLENOL) 500 MG tablet Take 1,000 mg by mouth every 6 (six) hours as needed. Pain   . aspirin 81 MG tablet Take 81 mg by mouth daily.  Marland Kitchen atorvastatin (LIPITOR) 10 MG tablet TAKE 1 TABLET(10 MG) BY MOUTH AT BEDTIME  . Glucosamine 500 MG CAPS Take 500 mg by mouth daily.   . hydrALAZINE (APRESOLINE) 10 MG tablet Take 1 tablet (10 mg total) by mouth 3 (three) times daily.  . Hypromellose (GENTEAL) 0.3 % SOLN Place 1-2 drops into both eyes 2 (two) times daily as needed. Dry eyes   . ibandronate (BONIVA) 150 MG tablet Take 1 tablet (150 mg total) by mouth every 30 (thirty) days. Take in the morning with a full glass of water, on an empty stomach, and do not take anything else by mouth or lie down for the next 30 min.  . meloxicam (MOBIC) 15 MG tablet TAKE 1 TABLET BY MOUTH EVERY DAY AFTER MEALS  . Multiple Vitamins-Minerals (CENTRUM SILVER) tablet Take 1 tablet by mouth daily.   . propranolol (INDERAL) 10 MG tablet Take one tab po tid for bp sys greater than 140 prn  . RESTASIS 0.05 % ophthalmic emulsion INSTILL 1 DROP INTO BOTH EYES TWICE A DAY  . [DISCONTINUED] meclizine (ANTIVERT) 25 MG tablet Take 25 mg by mouth as needed. Inner ear  . [DISCONTINUED] propranolol ER (INDERAL LA) 60 MG 24 hr capsule Take 1 capsule (60 mg total) by mouth daily. (Patient not taking: Reported on 06/23/2019)  .  [DISCONTINUED] valsartan-hydrochlorothiazide (DIOVAN-HCT) 160-12.5 MG tablet TAKE 1 TABLET BY MOUTH DAILY  . [DISCONTINUED] valsartan-hydrochlorothiazide (DIOVAN-HCT) 160-12.5 MG tablet Take one tab po bid for bp   Facility-Administered Encounter Medications as of 06/23/2019  Medication  . 0.9 %  sodium chloride infusion    Surgical History: Past Surgical History:  Procedure Laterality Date  . APPENDECTOMY  1955  . BUNIONECTOMY  1995   and osteotomy  . COLONOSCOPY  2017  . CYSTOSCOPY  02/22/2012   Procedure: CYSTOSCOPY;  Surgeon: Claybon Jabs, MD;  Location: [facility];  Service: Urology;  Laterality: Right;  removal of ureteral stent  . FLEXIBLE SIGMOIDOSCOPY  08/12/2012   Procedure: FLEXIBLE SIGMOIDOSCOPY;  Surgeon: Inda Castle, MD;  Location: [facility];  Service: Endoscopy;  Laterality: N/A;  . NEPHROLITHOTOMY  01/28/2012-  right ureteral stent placement   Procedure: NEPHROLITHOTOMY PERCUTANEOUS;  Surgeon: Claybon Jabs, MD;  Location: [facility];  Service: Urology;  Laterality: Right;  . OTHER SURGICAL HISTORY  01/28/12   right percutaneous nephroliathothoma  . REFRACTIVE SURGERY  yrs ago  . [place] and 2006   right  . URETEROSCOPY  02/22/2012   Procedure: URETEROSCOPY;  Surgeon: Claybon Jabs, MD;  Location: [facility];  Service: Urology;  Laterality: Right;  URETEROSCOPY WITH STONE EXTRACTION   . VAGINAL HYSTERECTOMY  1985    Medical History: Past Medical History:  Diagnosis Date  . Adenomatous colon polyp 07/03/2012  . Diverticulosis   . Frequency of urination   . History of kidney stones   . Hyperlipidemia   . Hypertension   . Internal hemorrhoids   . Mitral valve prolapse per dr wall note 09/12--  stable  . PAC (premature atrial contraction) followed by cardiologist --  dr wall--  last visit  sept 12  note in epic--  states stable  . Palpitations hx pac's-   occasional  . Renal calculus, right followed by dr  Karsten Ro   February 2013  . Right shoulder pain Dec 07, 2011 fall   pain at times with positioning  . Urgency of micturation   . Vertigo, intermittent     Family History: Family History  Problem Relation Age of Onset  . Liver cancer Mother   . Heart attack Sister   . Peripheral vascular disease Sister   . Colon cancer Neg Hx   . Breast cancer Neg Hx     Social History   Socioeconomic History  . Marital status: Married    Spouse name: Not on file  . Number of children: 3  . Years of education: Not on file  . Highest education level: Not on file  Occupational History  . Occupation: Housewife  Social Needs  . Financial resource strain: Not on file  . Food insecurity    Worry: Not on file    Inability: Not on file  . Transportation needs    Medical: Not on file    Non-medical: Not on file  Tobacco Use  . Smoking status: Former Smoker    Packs/day: 1.00    Years: 15.00    Pack years: 15.00    Quit date: 12/04/1975    Years since quitting: 43.6  . Smokeless tobacco: Never Used  Substance and Sexual Activity  . Alcohol use: Yes    Alcohol/week: 7.0 standard drinks    Types: 7 Glasses of wine per week    Comment: Socially  . Drug use: No  . Sexual activity: Not on file  Lifestyle  . Physical activity    Days per week: Not on file    Minutes per session: Not on file  . Stress: Not on file  Relationships  . Social Herbalist on phone: Not on file    Gets together: Not on file    Attends religious service: Not on file    Active member of club or organization: Not on file    Attends meetings of clubs or organizations: Not on file    Relationship status: Not on file  . Intimate partner violence    Fear of current or ex partner: Not on file    Emotionally abused: Not on file    Physically abused: Not on file    Forced sexual activity: Not on file  Other Topics Concern  . Not on file  Social History Narrative   Married.   Gets regular exercise.    Caffeine daily. 3 cups of coffee or soda per day    Review of Systems  Constitutional: Negative for chills, diaphoresis and fatigue.  HENT: Negative for ear pain, postnasal drip and sinus pressure.   Eyes: Negative for photophobia, discharge, redness, itching and visual disturbance.  Respiratory: Negative for cough, shortness of breath and wheezing.   Cardiovascular: Negative for chest pain, palpitations and leg swelling.  Gastrointestinal: Negative for abdominal  pain, constipation, diarrhea, nausea and vomiting.  Genitourinary: Negative for dysuria and flank pain.  Musculoskeletal: Negative for arthralgias, back pain, gait problem and neck pain.  Skin: Negative for color change.  Allergic/Immunologic: Negative for environmental allergies and food allergies.  Neurological: Negative for dizziness and headaches.  Hematological: Does not bruise/bleed easily.  Psychiatric/Behavioral: Negative for agitation, behavioral problems (depression) and hallucinations.    Vital Signs: BP (!) 148/80   Pulse 62   Resp 16   Ht 5\' 3"  (1.6 m)   Wt 127 lb (57.6 kg)   SpO2 98%   BMI 22.50 kg/m    Physical Exam Constitutional:      General: She is not in acute distress.    Appearance: She is well-developed. She is not diaphoretic.  HENT:     Head: Normocephalic and atraumatic.     Mouth/Throat:     Pharynx: No oropharyngeal exudate.  Eyes:     Pupils: Pupils are equal, round, and reactive to light.  Neck:     Musculoskeletal: Normal range of motion and neck supple.     Thyroid: No thyromegaly.     Vascular: No JVD.     Trachea: No tracheal deviation.  Cardiovascular:     Rate and Rhythm: Normal rate and regular rhythm.     Heart sounds: Normal heart sounds. No murmur. No friction rub. No gallop.   Pulmonary:     Effort: Pulmonary effort is normal. No respiratory distress.     Breath sounds: No wheezing or rales.  Chest:     Chest wall: No tenderness.  Abdominal:     General: Bowel  sounds are normal.     Palpations: Abdomen is soft.  Musculoskeletal: Normal range of motion.  Lymphadenopathy:     Cervical: No cervical adenopathy.  Skin:    General: Skin is warm and dry.  Neurological:     Mental Status: She is alert and oriented to person, place, and time.     Cranial Nerves: No cranial nerve deficit.  Psychiatric:        Behavior: Behavior normal.        Thought Content: Thought content normal.        Judgment: Judgment normal.    Assessment/Plan: 1. Essential hypertension, benign - Increase Diovan /hctz 320/25 mg po qd. Inderal prn and taper hydralazine down to DC  if pt has low bp  - Basic metabolic panel  2. Visit for screening mammogram - MM 3D SCREEN BREAST BILATERAL  3. Bradycardia - Improved after decreasing her dose for propranolol   General Counseling: Amy Gilmore verbalizes understanding of the findings of todays visit and agrees with plan of treatment. I have discussed any further diagnostic evaluation that may be needed or ordered today. We also reviewed her medications today. she has been encouraged to call the office with any questions or concerns that should arise related to todays visit.    Orders Placed This Encounter  Procedures  . Basic metabolic panel    Meds ordered this encounter  Medications  . DISCONTD: valsartan-hydrochlorothiazide (DIOVAN-HCT) 160-12.5 MG tablet    Sig: Take one tab po bid for bp    Dispense:  180 tablet    Refill:  2    Time spent:25 Minutes   Dr Lavera Guise Internal medicine

## 2019-06-24 ENCOUNTER — Other Ambulatory Visit: Payer: Self-pay | Admitting: Internal Medicine

## 2019-06-24 ENCOUNTER — Telehealth: Payer: Self-pay | Admitting: Internal Medicine

## 2019-06-24 MED ORDER — VALSARTAN-HYDROCHLOROTHIAZIDE 320-25 MG PO TABS: 1.0000 | ORAL_TABLET | Freq: Every day | ORAL | 0 refills | Status: DC

## 2019-06-24 NOTE — Telephone Encounter (Signed)
Patient is to finish her valsartan Jonita Albee as discussed in office with Dr Humphrey Rolls and new prescription is called into pharmacy for patient to take 1/2 tab morning and 1/2 tab in the evening

## 2019-06-29 ENCOUNTER — Other Ambulatory Visit: Payer: Self-pay

## 2019-06-29 MED ORDER — MECLIZINE HCL 25 MG PO TABS: 25.0000 mg | ORAL_TABLET | ORAL | 0 refills | Status: DC | PRN

## 2019-07-24 ENCOUNTER — Telehealth: Payer: Self-pay

## 2019-07-24 ENCOUNTER — Other Ambulatory Visit: Payer: Self-pay

## 2019-07-24 ENCOUNTER — Ambulatory Visit
Admission: RE | Admit: 2019-07-24 | Discharge: 2019-07-24 | Disposition: A | Discharge status: Home / Self Care | Payer: Medicare Other | Source: Ambulatory Visit | Attending: Internal Medicine | Admitting: Internal Medicine

## 2019-07-24 DIAGNOSIS — Z1231 Encounter for screening mammogram for malignant neoplasm of breast: Secondary | ICD-10-CM | POA: Insufficient documentation

## 2019-07-24 MED ORDER — CEPHALEXIN 500 MG PO CAPS: 500.0000 mg | ORAL_CAPSULE | Freq: Three times a day (TID) | ORAL | 0 refills | Status: AC

## 2019-07-24 NOTE — Telephone Encounter (Signed)
Pt called she had dog bite on right leg as per dr Humphrey Rolls send keflex 500 mg take 1 cap po tid 15 tab no refills so advised pt keep clean area and call us back Monday how she is doing

## 2019-07-28 ENCOUNTER — Other Ambulatory Visit: Payer: Self-pay

## 2019-07-28 ENCOUNTER — Ambulatory Visit (INDEPENDENT_AMBULATORY_CARE_PROVIDER_SITE_OTHER): Payer: Medicare Other | Admitting: Internal Medicine

## 2019-07-28 ENCOUNTER — Encounter: Payer: Self-pay | Admitting: Internal Medicine

## 2019-07-28 VITALS — BP 150/90 | HR 85 | Resp 16 | Ht 63.0 in | Wt 125.0 lb

## 2019-07-28 DIAGNOSIS — L03115 Cellulitis of right lower limb: Secondary | ICD-10-CM

## 2019-07-28 DIAGNOSIS — I10 Essential (primary) hypertension: Secondary | ICD-10-CM

## 2019-07-28 DIAGNOSIS — W540XXA Bitten by dog, initial encounter: Secondary | ICD-10-CM

## 2019-07-28 MED ORDER — AMOXICILLIN-POT CLAVULANATE 875-125 MG PO TABS: 1.0000 | ORAL_TABLET | Freq: Two times a day (BID) | ORAL | 0 refills | Status: DC

## 2019-07-28 NOTE — Progress Notes (Signed)
[facility] [place], [place] [address]  Internal MEDICINE  Office Visit Note  Patient Name: Amy Gilmore  [id number]  [id number]  Date of Service: 08/04/2019  Chief Complaint  Patient presents with  . Hypertension  . Hyperlipidemia  . Follow-up    Bp    HPI  83yr old female presents for f/u. She also c/o of occasional dizziness. She has a healing scar on the medial aspect of her left leg from a dog bite 2 weeks ago.  1) pt denies any fall. She agrees to decrease her dosage to reduce dizzy spells. 2) Pt c/o of swelling in the medial aspect of right leg where she has the bite mark; she is prescribed  antibiotics during this visit to help with the would from her dog bite. 3) Pt sates she is compliant with all her medications.  Current Medication: Outpatient Encounter Medications as of 07/28/2019  Medication Sig  . acetaminophen (TYLENOL) 500 MG tablet Take 1,000 mg by mouth every 6 (six) hours as needed. Pain   . aspirin 81 MG tablet Take 81 mg by mouth daily.  Marland Kitchen atorvastatin (LIPITOR) 10 MG tablet TAKE 1 TABLET(10 MG) BY MOUTH AT BEDTIME  . Glucosamine 500 MG CAPS Take 500 mg by mouth daily.   . Hypromellose (GENTEAL) 0.3 % SOLN Place 1-2 drops into both eyes 2 (two) times daily as needed. Dry eyes   . ibandronate (BONIVA) 150 MG tablet Take 1 tablet (150 mg total) by mouth every 30 (thirty) days. Take in the morning with a full glass of water, on an empty stomach, and do not take anything else by mouth or lie down for the next 30 min.  . meclizine (ANTIVERT) 25 MG tablet Take 1 tablet (25 mg total) by mouth as needed. Inner ear  . meloxicam (MOBIC) 15 MG tablet TAKE 1 TABLET BY MOUTH EVERY DAY AFTER MEALS  . Multiple Vitamins-Minerals (CENTRUM SILVER) tablet Take 1 tablet by mouth daily.   . propranolol (INDERAL) 10 MG tablet Take one tab po tid for bp sys greater than 140 prn  . RESTASIS 0.05 % ophthalmic emulsion INSTILL 1 DROP INTO BOTH EYES  TWICE A DAY  . valsartan-hydrochlorothiazide (DIOVAN-HCT) 320-25 MG tablet Take 1 tablet by mouth daily.  . [DISCONTINUED] hydrALAZINE (APRESOLINE) 10 MG tablet Take 1 tablet (10 mg total) by mouth 3 (three) times daily.  Marland Kitchen amoxicillin-clavulanate (AUGMENTIN) 875-125 MG tablet Take 1 tablet by mouth 2 (two) times daily.   Facility-Administered Encounter Medications as of 07/28/2019  Medication  . 0.9 %  sodium chloride infusion    Surgical History: Past Surgical History:  Procedure Laterality Date  . APPENDECTOMY  1955  . BUNIONECTOMY  1995   and osteotomy  . COLONOSCOPY  2017  . CYSTOSCOPY  02/22/2012   Procedure: CYSTOSCOPY;  Surgeon: Claybon Jabs, MD;  Location: [facility];  Service: Urology;  Laterality: Right;  removal of ureteral stent  . FLEXIBLE SIGMOIDOSCOPY  08/12/2012   Procedure: FLEXIBLE SIGMOIDOSCOPY;  Surgeon: Inda Castle, MD;  Location: [facility];  Service: Endoscopy;  Laterality: N/A;  . NEPHROLITHOTOMY  01/28/2012-  right ureteral stent placement   Procedure: NEPHROLITHOTOMY PERCUTANEOUS;  Surgeon: Claybon Jabs, MD;  Location: [facility];  Service: Urology;  Laterality: Right;  . OTHER SURGICAL HISTORY  01/28/12   right percutaneous nephroliathothoma  . REFRACTIVE SURGERY  yrs ago  . [place] and 2006   right  . URETEROSCOPY  02/22/2012   Procedure: URETEROSCOPY;  Surgeon: Claybon Jabs, MD;  Location: [facility];  Service: Urology;  Laterality: Right;  URETEROSCOPY WITH STONE EXTRACTION   . VAGINAL HYSTERECTOMY  1985    Medical History: Past Medical History:  Diagnosis Date  . Adenomatous colon polyp 07/03/2012  . Diverticulosis   . Frequency of urination   . History of kidney stones   . Hyperlipidemia   . Hypertension   . Internal hemorrhoids   . Mitral valve prolapse per dr wall note 09/12--  stable  . PAC (premature atrial contraction) followed by cardiologist --  dr wall--  last visit  sept 12  note  in epic--  states stable  . Palpitations hx pac's-   occasional  . Renal calculus, right followed by dr Karsten Ro   February 2013  . Right shoulder pain Dec 07, 2011 fall   pain at times with positioning  . Urgency of micturation   . Vertigo, intermittent     Family History: Family History  Problem Relation Age of Onset  . Liver cancer Mother   . Heart attack Sister   . Peripheral vascular disease Sister   . Colon cancer Neg Hx   . Breast cancer Neg Hx     Social History   Socioeconomic History  . Marital status: Married    Spouse name: Not on file  . Number of children: 3  . Years of education: Not on file  . Highest education level: Not on file  Occupational History  . Occupation: Housewife  Social Needs  . Financial resource strain: Not on file  . Food insecurity    Worry: Not on file    Inability: Not on file  . Transportation needs    Medical: Not on file    Non-medical: Not on file  Tobacco Use  . Smoking status: Former Smoker    Packs/day: 1.00    Years: 15.00    Pack years: 15.00    Quit date: 12/04/1975    Years since quitting: 43.6  . Smokeless tobacco: Never Used  Substance and Sexual Activity  . Alcohol use: Yes    Alcohol/week: 7.0 standard drinks    Types: 7 Glasses of wine per week    Comment: Socially  . Drug use: No  . Sexual activity: Not on file  Lifestyle  . Physical activity    Days per week: Not on file    Minutes per session: Not on file  . Stress: Not on file  Relationships  . Social Herbalist on phone: Not on file    Gets together: Not on file    Attends religious service: Not on file    Active member of club or organization: Not on file    Attends meetings of clubs or organizations: Not on file    Relationship status: Not on file  . Intimate partner violence    Fear of current or ex partner: Not on file    Emotionally abused: Not on file    Physically abused: Not on file    Forced sexual activity: Not on file   Other Topics Concern  . Not on file  Social History Narrative   Married.   Gets regular exercise.   Caffeine daily. 3 cups of coffee or soda per day    Review of Systems  Constitutional: Negative for chills, diaphoresis and fatigue.  HENT: Negative for ear pain, postnasal drip and sinus pressure.   Eyes: Negative  for photophobia, discharge, redness, itching and visual disturbance.  Respiratory: Negative for cough, shortness of breath and wheezing.   Cardiovascular: Negative for chest pain, palpitations and leg swelling.  Gastrointestinal: Negative for abdominal pain, constipation, diarrhea, nausea and vomiting.  Genitourinary: Negative for dysuria and flank pain.  Musculoskeletal: Negative for arthralgias, back pain, gait problem and neck pain.  Skin: Negative for color change.  Allergic/Immunologic: Negative for environmental allergies and food allergies.  Neurological: Negative for dizziness and headaches.  Hematological: Does not bruise/bleed easily.  Psychiatric/Behavioral: Negative for agitation, behavioral problems (depression) and hallucinations.    Vital Signs: BP (!) 150/90 (BP Location: Left Arm, Patient Position: Sitting, Cuff Size: Normal)   Pulse 85   Resp 16   Ht 5\' 3"  (1.6 m)   Wt 125 lb (56.7 kg)   SpO2 96%   BMI 22.14 kg/m    Physical Exam Constitutional:      General: She is not in acute distress.    Appearance: She is well-developed. She is not diaphoretic.  HENT:     Head: Normocephalic and atraumatic.     Mouth/Throat:     Pharynx: No oropharyngeal exudate.  Eyes:     Pupils: Pupils are equal, round, and reactive to light.  Neck:     Musculoskeletal: Normal range of motion and neck supple.     Thyroid: No thyromegaly.     Vascular: No JVD.     Trachea: No tracheal deviation.  Cardiovascular:     Rate and Rhythm: Normal rate and regular rhythm.     Heart sounds: Normal heart sounds. No murmur. No friction rub. No gallop.   Pulmonary:      Effort: Pulmonary effort is normal. No respiratory distress.     Breath sounds: No wheezing or rales.  Chest:     Chest wall: No tenderness.  Abdominal:     General: Bowel sounds are normal.     Palpations: Abdomen is soft.  Musculoskeletal: Normal range of motion.  Lymphadenopathy:     Cervical: No cervical adenopathy.  Skin:    General: Skin is warm and dry.     Comments: Left shin has open wound with erythema, slight oozing   Neurological:     Mental Status: She is alert and oriented to person, place, and time.     Cranial Nerves: No cranial nerve deficit.  Psychiatric:        Behavior: Behavior normal.        Thought Content: Thought content normal.        Judgment: Judgment normal.    Assessment/Plan: 1. Essential hypertension, benign - Continue Diovan/hctz 320/25 mg po qd  - Basic metabolic panel  2. Dog bite, initial encounter - This is her own pet, fully vaccinated   3. Cellulitis of right lower extremity - amoxicillin-clavulanate (AUGMENTIN) 875-125 MG tablet; Take 1 tablet by mouth 2 (two) times daily.  Dispense: 10 tablet; Refill: 0  General Counseling: Dora verbalizes understanding of the findings of todays visit and agrees with plan of treatment. I have discussed any further diagnostic evaluation that may be needed or ordered today. We also reviewed her medications today. she has been encouraged to call the office with any questions or concerns that should arise related to todays visit.  Orders Placed This Encounter  Procedures  . Basic metabolic panel    Meds ordered this encounter  Medications  . amoxicillin-clavulanate (AUGMENTIN) 875-125 MG tablet    Sig: Take 1 tablet by mouth 2 (two) times  daily.    Dispense:  10 tablet    Refill:  0    Time spent:25 Minutes  Dr Lavera Guise Internal medicine

## 2019-08-04 ENCOUNTER — Telehealth: Payer: Self-pay

## 2019-08-04 NOTE — Telephone Encounter (Signed)
Pt called that her right leg still oozing and still red as per dfk pt need seen tomorrow by adam may be need culture and pt advised and make appt for tomorrow

## 2019-08-05 ENCOUNTER — Other Ambulatory Visit: Payer: Self-pay

## 2019-08-05 ENCOUNTER — Ambulatory Visit (INDEPENDENT_AMBULATORY_CARE_PROVIDER_SITE_OTHER): Payer: Medicare Other | Admitting: Adult Health

## 2019-08-05 ENCOUNTER — Encounter: Payer: Self-pay | Admitting: Adult Health

## 2019-08-05 VITALS — BP 166/94 | HR 75 | Temp 98.2°F | Resp 16 | Ht 63.0 in | Wt 126.6 lb

## 2019-08-05 DIAGNOSIS — S51059A Open bite, unspecified elbow, initial encounter: Secondary | ICD-10-CM | POA: Diagnosis not present

## 2019-08-05 DIAGNOSIS — S91052S Open bite, left ankle, sequela: Secondary | ICD-10-CM | POA: Diagnosis not present

## 2019-08-05 DIAGNOSIS — R42 Dizziness and giddiness: Secondary | ICD-10-CM | POA: Diagnosis not present

## 2019-08-05 DIAGNOSIS — I10 Essential (primary) hypertension: Secondary | ICD-10-CM

## 2019-08-05 MED ORDER — AMOXICILLIN-POT CLAVULANATE 875-125 MG PO TABS: 1.0000 | ORAL_TABLET | Freq: Two times a day (BID) | ORAL | 0 refills | Status: DC

## 2019-08-05 NOTE — Progress Notes (Signed)
[facility] [place], [place] [address]  Internal MEDICINE  Office Visit Note  Patient Name: Amy Gilmore  [id number]  [id number]  Date of Service: 08/05/2019  Chief Complaint  Patient presents with  . Animal Bite    still redness , drainage and swelling , completed antibiotics     HPI Pt is here for a sick visit.  Pt reports she was bitten by a dog about 4 weeks ago. She took 5 days of augmentin from her last visit. The dog bite has improved but there is still 2 deep puncture wounds that are draining pus.  There is some redness around the site, with some warmth noted. She denies fever, or fatigue.     Current Medication:  Outpatient Encounter Medications as of 08/05/2019  Medication Sig  . acetaminophen (TYLENOL) 500 MG tablet Take 1,000 mg by mouth every 6 (six) hours as needed. Pain   . amoxicillin-clavulanate (AUGMENTIN) 875-125 MG tablet Take 1 tablet by mouth 2 (two) times daily.  Marland Kitchen aspirin 81 MG tablet Take 81 mg by mouth daily.  Marland Kitchen atorvastatin (LIPITOR) 10 MG tablet TAKE 1 TABLET(10 MG) BY MOUTH AT BEDTIME  . Glucosamine 500 MG CAPS Take 500 mg by mouth daily.   . Hypromellose (GENTEAL) 0.3 % SOLN Place 1-2 drops into both eyes 2 (two) times daily as needed. Dry eyes   . ibandronate (BONIVA) 150 MG tablet Take 1 tablet (150 mg total) by mouth every 30 (thirty) days. Take in the morning with a full glass of water, on an empty stomach, and do not take anything else by mouth or lie down for the next 30 min.  . meclizine (ANTIVERT) 25 MG tablet Take 1 tablet (25 mg total) by mouth as needed. Inner ear  . meloxicam (MOBIC) 15 MG tablet TAKE 1 TABLET BY MOUTH EVERY DAY AFTER MEALS  . Multiple Vitamins-Minerals (CENTRUM SILVER) tablet Take 1 tablet by mouth daily.   . propranolol (INDERAL) 10 MG tablet Take one tab po tid for bp sys greater than 140 prn  . RESTASIS 0.05 % ophthalmic emulsion INSTILL 1 DROP INTO BOTH EYES TWICE A DAY  .  valsartan-hydrochlorothiazide (DIOVAN-HCT) 320-25 MG tablet Take 1 tablet by mouth daily.  . [DISCONTINUED] amoxicillin-clavulanate (AUGMENTIN) 875-125 MG tablet Take 1 tablet by mouth 2 (two) times daily. (Patient not taking: Reported on 08/05/2019)   Facility-Administered Encounter Medications as of 08/05/2019  Medication  . 0.9 %  sodium chloride infusion      Medical History: Past Medical History:  Diagnosis Date  . Adenomatous colon polyp 07/03/2012  . Diverticulosis   . Frequency of urination   . History of kidney stones   . Hyperlipidemia   . Hypertension   . Internal hemorrhoids   . Mitral valve prolapse per dr wall note 09/12--  stable  . PAC (premature atrial contraction) followed by cardiologist --  dr wall--  last visit  sept 12  note in epic--  states stable  . Palpitations hx pac's-   occasional  . Renal calculus, right followed by dr Karsten Ro   February 2013  . Right shoulder pain Dec 07, 2011 fall   pain at times with positioning  . Urgency of micturation   . Vertigo, intermittent      Vital Signs: BP (!) 166/94   Pulse 75   Temp 98.2 F (36.8 C)   Resp 16   Ht 5\' 3"  (1.6 m)   Wt 126 lb 9.6 oz (57.4  kg)   SpO2 97%   BMI 22.43 kg/m    Review of Systems  Constitutional: Negative for chills, fatigue and unexpected weight change.  HENT: Negative for congestion, rhinorrhea, sneezing and sore throat.   Eyes: Negative for photophobia, pain and redness.  Respiratory: Negative for cough, chest tightness and shortness of breath.   Cardiovascular: Negative for chest pain and palpitations.  Gastrointestinal: Negative for abdominal pain, constipation, diarrhea, nausea and vomiting.  Endocrine: Negative.   Genitourinary: Negative for dysuria and frequency.  Musculoskeletal: Negative for arthralgias, back pain, joint swelling and neck pain.  Skin: Negative for rash.  Allergic/Immunologic: Negative.   Neurological: Negative for tremors and numbness.  Hematological:  Negative for adenopathy. Does not bruise/bleed easily.  Psychiatric/Behavioral: Negative for behavioral problems and sleep disturbance. The patient is not nervous/anxious.     Physical Exam Vitals signs and nursing note reviewed.  Constitutional:      General: She is not in acute distress.    Appearance: She is well-developed. She is not diaphoretic.  HENT:     Head: Normocephalic and atraumatic.     Mouth/Throat:     Pharynx: No oropharyngeal exudate.  Eyes:     Pupils: Pupils are equal, round, and reactive to light.  Neck:     Musculoskeletal: Normal range of motion and neck supple.     Thyroid: No thyromegaly.     Vascular: No JVD.     Trachea: No tracheal deviation.  Cardiovascular:     Rate and Rhythm: Normal rate and regular rhythm.     Heart sounds: Normal heart sounds. No murmur. No friction rub. No gallop.   Pulmonary:     Effort: Pulmonary effort is normal. No respiratory distress.     Breath sounds: Normal breath sounds. No wheezing or rales.  Chest:     Chest wall: No tenderness.  Abdominal:     Palpations: Abdomen is soft.     Tenderness: There is no abdominal tenderness. There is no guarding.  Musculoskeletal: Normal range of motion.  Lymphadenopathy:     Cervical: No cervical adenopathy.  Skin:    General: Skin is warm and dry.  Neurological:     Mental Status: She is alert and oriented to person, place, and time.     Cranial Nerves: No cranial nerve deficit.  Psychiatric:        Behavior: Behavior normal.        Thought Content: Thought content normal.        Judgment: Judgment normal.     Assessment/Plan: 1. Dog bite of ankle, left, sequela Advised patient to take entire course of antibiotics as prescribed with food. Pt should return to clinic in 7-10 days if symptoms fail to improve or new symptoms develop.  - amoxicillin-clavulanate (AUGMENTIN) 875-125 MG tablet; Take 1 tablet by mouth 2 (two) times daily.  Dispense: 10 tablet; Refill: 0  2.  Dizziness Improved with medication change.  3. Essential hypertension, benign Stable, continue present management.  General Counseling: Amy Gilmore verbalizes understanding of the findings of todays visit and agrees with plan of treatment. I have discussed any further diagnostic evaluation that may be needed or ordered today. We also reviewed her medications today. she has been encouraged to call the office with any questions or concerns that should arise related to todays visit.   No orders of the defined types were placed in this encounter.   Meds ordered this encounter  Medications  . amoxicillin-clavulanate (AUGMENTIN) 875-125 MG tablet  Sig: Take 1 tablet by mouth 2 (two) times daily.    Dispense:  10 tablet    Refill:  0    Time spent: 15 Minutes  This patient was seen by Orson Gear AGNP-C in Collaboration with Dr Lavera Guise as a part of collaborative care agreement.  Kendell Bane AGNP-C Internal Medicine

## 2019-08-09 LAB — ANAEROBIC AND AEROBIC CULTURE

## 2019-08-11 ENCOUNTER — Ambulatory Visit: Payer: Medicare Other | Admitting: Internal Medicine

## 2019-08-12 ENCOUNTER — Telehealth: Payer: Self-pay

## 2019-08-12 ENCOUNTER — Other Ambulatory Visit: Payer: Self-pay

## 2019-08-12 MED ORDER — VALSARTAN-HYDROCHLOROTHIAZIDE 320-12.5 MG PO TABS: 1.0000 | ORAL_TABLET | Freq: Every day | ORAL | 1 refills | Status: DC

## 2019-08-12 NOTE — Telephone Encounter (Signed)
lmom to call us back

## 2019-08-12 NOTE — Telephone Encounter (Signed)
Pt called that dr Humphrey Rolls told her that we can change her diovan -hctz 320/25mg  to diovan-hctz 320/12.5 mg if causing dizziness as per dr Humphrey Rolls d/c diovan 320/25 and send new pres to ph[facility] diovan-hctz 320/12.5 once daily

## 2019-08-12 NOTE — Telephone Encounter (Signed)
Spoke with pt she is doing good oozing is better and little drainage as per dr Humphrey Rolls advised pt keep an eye that and call us back Monday she how she is doing

## 2019-08-31 ENCOUNTER — Other Ambulatory Visit: Payer: Self-pay | Admitting: Adult Health

## 2019-08-31 MED ORDER — MECLIZINE HCL 25 MG PO TABS: 25.0000 mg | ORAL_TABLET | ORAL | 0 refills | Status: DC | PRN

## 2019-09-02 DIAGNOSIS — Z23 Encounter for immunization: Secondary | ICD-10-CM | POA: Diagnosis not present

## 2019-09-04 DIAGNOSIS — L814 Other melanin hyperpigmentation: Secondary | ICD-10-CM | POA: Diagnosis not present

## 2019-09-04 DIAGNOSIS — L820 Inflamed seborrheic keratosis: Secondary | ICD-10-CM | POA: Diagnosis not present

## 2019-09-04 DIAGNOSIS — L821 Other seborrheic keratosis: Secondary | ICD-10-CM | POA: Diagnosis not present

## 2019-09-04 DIAGNOSIS — L570 Actinic keratosis: Secondary | ICD-10-CM | POA: Diagnosis not present

## 2019-09-04 DIAGNOSIS — D1801 Hemangioma of skin and subcutaneous tissue: Secondary | ICD-10-CM | POA: Diagnosis not present

## 2019-09-15 ENCOUNTER — Ambulatory Visit: Payer: Medicare Other | Admitting: Internal Medicine

## 2019-09-29 ENCOUNTER — Ambulatory Visit: Payer: Medicare Other | Admitting: Internal Medicine

## 2019-10-13 ENCOUNTER — Ambulatory Visit (INDEPENDENT_AMBULATORY_CARE_PROVIDER_SITE_OTHER): Payer: Medicare Other | Admitting: Internal Medicine

## 2019-10-13 ENCOUNTER — Other Ambulatory Visit: Payer: Self-pay

## 2019-10-13 ENCOUNTER — Encounter: Payer: Self-pay | Admitting: Internal Medicine

## 2019-10-13 VITALS — BP 150/90 | HR 74 | Temp 97.9°F | Resp 16 | Ht 63.0 in | Wt 128.0 lb

## 2019-10-13 DIAGNOSIS — I10 Essential (primary) hypertension: Secondary | ICD-10-CM

## 2019-10-13 DIAGNOSIS — E782 Mixed hyperlipidemia: Secondary | ICD-10-CM | POA: Diagnosis not present

## 2019-10-13 DIAGNOSIS — Z1231 Encounter for screening mammogram for malignant neoplasm of breast: Secondary | ICD-10-CM | POA: Diagnosis not present

## 2019-10-13 DIAGNOSIS — M810 Age-related osteoporosis without current pathological fracture: Secondary | ICD-10-CM

## 2019-10-13 DIAGNOSIS — Z0001 Encounter for general adult medical examination with abnormal findings: Secondary | ICD-10-CM | POA: Diagnosis not present

## 2019-10-13 NOTE — Progress Notes (Signed)
[facility] [place], [place] [address]  Internal MEDICINE  Office Visit Note  Patient Name: Amy Gilmore  [id number]  [id number]  Date of Service: 10/15/2019  Chief Complaint  Patient presents with  . Annual Exam  . Hyperlipidemia  . Hypertension    HPI Pt is here for routine health maintenance examination. Denies any major complaints. bp is well controlled at home. Today is elevated at the office visit. Vertigo is almost resolved, Only positional when lays in bed. Does have Disc disease, Chronic arthritis. Her dog bite finally healed     Cardiology follow up once a year for MVP. Her heart rate is improved as well, takes propanolol only bid  BMD reviewed again, she is going to think about taking Boniva, she does have osteoporosis. Still plays golf   Current Medication: Outpatient Encounter Medications as of 10/13/2019  Medication Sig  . acetaminophen (TYLENOL) 500 MG tablet Take 1,000 mg by mouth every 6 (six) hours as needed. Pain   . aspirin 81 MG tablet Take 81 mg by mouth daily.  Marland Kitchen atorvastatin (LIPITOR) 10 MG tablet TAKE 1 TABLET(10 MG) BY MOUTH AT BEDTIME  . Glucosamine 500 MG CAPS Take 500 mg by mouth daily.   . Hypromellose (GENTEAL) 0.3 % SOLN Place 1-2 drops into both eyes 2 (two) times daily as needed. Dry eyes   . ibandronate (BONIVA) 150 MG tablet Take 1 tablet (150 mg total) by mouth every 30 (thirty) days. Take in the morning with a full glass of water, on an empty stomach, and do not take anything else by mouth or lie down for the next 30 min.  . meclizine (ANTIVERT) 25 MG tablet Take 1 tablet (25 mg total) by mouth as needed. Inner ear  . meloxicam (MOBIC) 15 MG tablet TAKE 1 TABLET BY MOUTH EVERY DAY AFTER MEALS  . Multiple Vitamins-Minerals (CENTRUM SILVER) tablet Take 1 tablet by mouth daily.   . propranolol (INDERAL) 10 MG tablet Take one tab po tid for bp sys greater than 140 prn  . RESTASIS 0.05 % ophthalmic emulsion  INSTILL 1 DROP INTO BOTH EYES TWICE A DAY  . valsartan-hydrochlorothiazide (DIOVAN-HCT) 320-12.5 MG tablet Take 1 tablet by mouth daily.  . [DISCONTINUED] amoxicillin-clavulanate (AUGMENTIN) 875-125 MG tablet Take 1 tablet by mouth 2 (two) times daily. (Patient not taking: Reported on 10/13/2019)   Facility-Administered Encounter Medications as of 10/13/2019  Medication  . 0.9 %  sodium chloride infusion    Surgical History: Past Surgical History:  Procedure Laterality Date  . APPENDECTOMY  1955  . BUNIONECTOMY  1995   and osteotomy  . COLONOSCOPY  2017  . CYSTOSCOPY  02/22/2012   Procedure: CYSTOSCOPY;  Surgeon: Claybon Jabs, MD;  Location: [facility];  Service: Urology;  Laterality: Right;  removal of ureteral stent  . FLEXIBLE SIGMOIDOSCOPY  08/12/2012   Procedure: FLEXIBLE SIGMOIDOSCOPY;  Surgeon: Inda Castle, MD;  Location: [facility];  Service: Endoscopy;  Laterality: N/A;  . NEPHROLITHOTOMY  01/28/2012-  right ureteral stent placement   Procedure: NEPHROLITHOTOMY PERCUTANEOUS;  Surgeon: Claybon Jabs, MD;  Location: [facility];  Service: Urology;  Laterality: Right;  . OTHER SURGICAL HISTORY  01/28/12   right percutaneous nephroliathothoma  . REFRACTIVE SURGERY  yrs ago  . [place] and 2006   right  . URETEROSCOPY  02/22/2012   Procedure: URETEROSCOPY;  Surgeon: Claybon Jabs, MD;  Location: [facility];  Service: Urology;  Laterality: Right;  URETEROSCOPY WITH STONE EXTRACTION   . VAGINAL HYSTERECTOMY  1985    Medical History: Past Medical History:  Diagnosis Date  . Adenomatous colon polyp 07/03/2012  . Diverticulosis   . Frequency of urination   . History of kidney stones   . Hyperlipidemia   . Hypertension   . Internal hemorrhoids   . Mitral valve prolapse per dr wall note 09/12--  stable  . PAC (premature atrial contraction) followed by cardiologist --  dr wall--  last visit  sept 12  note in epic--  states stable   . Palpitations hx pac's-   occasional  . Renal calculus, right followed by dr Karsten Ro   February 2013  . Right shoulder pain Dec 07, 2011 fall   pain at times with positioning  . Urgency of micturation   . Vertigo, intermittent     Family History: Family History  Problem Relation Age of Onset  . Liver cancer Mother   . Heart attack Sister   . Peripheral vascular disease Sister   . Colon cancer Neg Hx   . Breast cancer Neg Hx     Review of Systems  Constitutional: Negative for chills, diaphoresis and fatigue.  HENT: Negative for ear pain, postnasal drip and sinus pressure.   Eyes: Negative for photophobia, discharge, redness, itching and visual disturbance.  Respiratory: Negative for cough, shortness of breath and wheezing.   Cardiovascular: Negative for chest pain, palpitations and leg swelling.  Gastrointestinal: Negative for abdominal pain, constipation, diarrhea, nausea and vomiting.  Genitourinary: Negative for dysuria and flank pain.  Musculoskeletal: Positive for arthralgias. Negative for back pain, gait problem and neck pain.  Skin: Negative for color change.  Allergic/Immunologic: Negative for environmental allergies and food allergies.  Neurological: Positive for dizziness. Negative for headaches.  Hematological: Does not bruise/bleed easily.  Psychiatric/Behavioral: Negative for agitation, behavioral problems (depression) and hallucinations.   Vital Signs: BP (!) 150/90   Pulse 74   Temp 97.9 F (36.6 C)   Resp 16   Ht 5\' 3"  (1.6 m)   Wt 128 lb (58.1 kg)   SpO2 97%   BMI 22.67 kg/m    Physical Exam Constitutional:      General: She is not in acute distress.    Appearance: She is well-developed. She is not diaphoretic.  HENT:     Head: Normocephalic and atraumatic.     Mouth/Throat:     Pharynx: No oropharyngeal exudate.  Eyes:     Pupils: Pupils are equal, round, and reactive to light.  Neck:     Musculoskeletal: Normal range of motion and neck  supple.     Thyroid: No thyromegaly.     Vascular: No JVD.     Trachea: No tracheal deviation.  Cardiovascular:     Rate and Rhythm: Normal rate and regular rhythm.     Heart sounds: Normal heart sounds. No murmur. No friction rub. No gallop.   Pulmonary:     Effort: Pulmonary effort is normal. No respiratory distress.     Breath sounds: No wheezing or rales.  Chest:     Chest wall: No tenderness.     Breasts:        Right: Normal. No mass.        Left: Normal. No mass.  Abdominal:     General: Bowel sounds are normal.     Palpations: Abdomen is soft.  Musculoskeletal: Normal range of motion.  Lymphadenopathy:     Cervical: No cervical adenopathy.  Skin:    General: Skin is warm and dry.  Neurological:     Mental Status: She is alert and oriented to person, place, and time.     Cranial Nerves: No cranial nerve deficit.  Psychiatric:        Behavior: Behavior normal.        Thought Content: Thought content normal.        Judgment: Judgment normal.      LABS: Recent Results (from the past 2160 hour(s))  Anaerobic and Aerobic Culture     Status: Abnormal   Collection Time: 08/05/19  9:10 AM   Specimen: Leg, Right; Wound   WOUND  Result Value Ref Range   Anaerobic Culture Final report    Result 1 Comment     Comment: No anaerobic growth in 72 hours.   Aerobic Culture Final report (A)    Result 1 Pasteurella multocida (A)     Comment: Heavy growth Beta lactamase negative.    Assessment/Plan: 1. Encounter for general adult medical examination with abnormal findings - pt is up to date on all preventive medicine  - CBC with Differential/Platelet - Lipid Panel With LDL/HDL Ratio - TSH - T4, free - Comprehensive metabolic panel - Urinalysis  2. Essential hypertension - Continue to monitor   3. Mixed hyperlipidemia - Continue to monitor   4. Osteoporosis, senile - Encouraged to take Boniva along with calcium and vitd   5. Encounter for mammogram to establish  baseline mammogram - She has follow up app   General Counseling: Evangaline verbalizes understanding of the findings of todays visit and agrees with plan of treatment. I have discussed any further diagnostic evaluation that may be needed or ordered today. We also reviewed her medications today. she has been encouraged to call the office with any questions or concerns that should arise related to todays visit. Counseling:Hypertension Counseling:   The following hypertensive lifestyle modification were recommended and discussed:  1. Limiting alcohol intake to less than 1 oz/day of ethanol:(24 oz of beer or 8 oz of wine or 2 oz of 100-proof whiskey). 2. Take baby ASA 81 mg daily. 3. Importance of regular aerobic exercise and losing weight. 4. Reduce dietary saturated fat and cholesterol intake for overall cardiovascular health. 5. Maintaining adequate dietary potassium, calcium, and magnesium intake. 6. Regular monitoring of the blood pressure. 7. Reduce sodium intake to less than 100 mmol/day (less than 2.3 gm of sodium or less than 6 gm of sodium choride)    Orders Placed This Encounter  Procedures  . CBC with Differential/Platelet  . Lipid Panel With LDL/HDL Ratio  . TSH  . T4, free  . Comprehensive metabolic panel  . Urinalysis   Time spent: [place], MD  Internal Medicine

## 2019-11-05 DIAGNOSIS — H903 Sensorineural hearing loss, bilateral: Secondary | ICD-10-CM | POA: Diagnosis not present

## 2019-11-05 DIAGNOSIS — H8110 Benign paroxysmal vertigo, unspecified ear: Secondary | ICD-10-CM | POA: Diagnosis not present

## 2019-11-10 DIAGNOSIS — I10 Essential (primary) hypertension: Secondary | ICD-10-CM | POA: Diagnosis not present

## 2019-11-10 DIAGNOSIS — E782 Mixed hyperlipidemia: Secondary | ICD-10-CM | POA: Diagnosis not present

## 2019-11-10 DIAGNOSIS — Z0001 Encounter for general adult medical examination with abnormal findings: Secondary | ICD-10-CM | POA: Diagnosis not present

## 2019-11-11 DIAGNOSIS — H8111 Benign paroxysmal vertigo, right ear: Secondary | ICD-10-CM | POA: Diagnosis not present

## 2019-11-11 DIAGNOSIS — I10 Essential (primary) hypertension: Secondary | ICD-10-CM | POA: Diagnosis not present

## 2019-11-11 LAB — LIPID PANEL WITH LDL/HDL RATIO
Cholesterol, Total: 148 mg/dL (ref 100–199)
HDL: 65 mg/dL (ref >39)
LDL Chol Calc (NIH): 65 mg/dL (ref 0–99)
LDL/HDL Ratio: 1 ratio (ref 0.0–3.2)
Triglycerides: 98 mg/dL (ref 0–149)
VLDL Cholesterol Cal: 18 mg/dL (ref 5–40)

## 2019-11-11 LAB — CBC WITH DIFFERENTIAL/PLATELET
Basophils Absolute: 0.1 10*3/uL (ref 0.0–0.2)
Basos: 1 %
EOS (ABSOLUTE): 0.2 10*3/uL (ref 0.0–0.4)
Eos: 3 %
Hematocrit: 40.3 % (ref 34.0–46.6)
Hemoglobin: 13.8 g/dL (ref 11.1–15.9)
Immature Grans (Abs): 0 10*3/uL (ref 0.0–0.1)
Immature Granulocytes: 0 %
Lymphocytes Absolute: 2 10*3/uL (ref 0.7–3.1)
Lymphs: 29 %
MCH: 33.2 pg — ABNORMAL HIGH (ref 26.6–33.0)
MCHC: 34.2 g/dL (ref 31.5–35.7)
MCV: 97 fL (ref 79–97)
Monocytes Absolute: 0.6 10*3/uL (ref 0.1–0.9)
Monocytes: 9 %
Neutrophils Absolute: 3.9 10*3/uL (ref 1.4–7.0)
Neutrophils: 58 %
Platelets: 371 10*3/uL (ref 150–450)
RBC: 4.16 x10E6/uL (ref 3.77–5.28)
RDW: 12.6 % (ref 11.7–15.4)
WBC: 6.7 10*3/uL (ref 3.4–10.8)

## 2019-11-11 LAB — COMPREHENSIVE METABOLIC PANEL
ALT: 26 IU/L (ref 0–32)
AST: 31 IU/L (ref 0–40)
Albumin/Globulin Ratio: 1.9 (ref 1.2–2.2)
Albumin: 4.5 g/dL (ref 3.6–4.6)
Alkaline Phosphatase: 72 IU/L (ref 39–117)
BUN/Creatinine Ratio: 19 (ref 12–28)
BUN: 14 mg/dL (ref 8–27)
Bilirubin Total: 0.6 mg/dL (ref 0.0–1.2)
CO2: 25 mmol/L (ref 20–29)
Calcium: 10 mg/dL (ref 8.7–10.3)
Chloride: 99 mmol/L (ref 96–106)
Creatinine, Ser: 0.74 mg/dL (ref 0.57–1.00)
GFR calc Af Amer: 88 mL/min/{1.73_m2} (ref >59)
GFR calc non Af Amer: 76 mL/min/{1.73_m2} (ref >59)
Globulin, Total: 2.4 g/dL (ref 1.5–4.5)
Glucose: 111 mg/dL — ABNORMAL HIGH (ref 65–99)
Potassium: 3.7 mmol/L (ref 3.5–5.2)
Sodium: 140 mmol/L (ref 134–144)
Total Protein: 6.9 g/dL (ref 6.0–8.5)

## 2019-11-11 LAB — T4, FREE: Free T4: 1.46 ng/dL (ref 0.82–1.77)

## 2019-11-11 LAB — TSH: TSH: 3.63 u[IU]/mL (ref 0.450–4.500)

## 2019-11-27 NOTE — Progress Notes (Signed)
All labs are WNL

## 2019-11-30 ENCOUNTER — Telehealth: Payer: Self-pay

## 2019-11-30 NOTE — Telephone Encounter (Signed)
-----   Message from Lavera Guise, MD sent at 11/27/2019  8:57 AM EST ----- All labs are WNL

## 2019-11-30 NOTE — Telephone Encounter (Signed)
Pt was notified labs are wnl

## 2019-12-10 DIAGNOSIS — Z23 Encounter for immunization: Secondary | ICD-10-CM | POA: Diagnosis not present

## 2019-12-31 DIAGNOSIS — Z23 Encounter for immunization: Secondary | ICD-10-CM | POA: Diagnosis not present

## 2020-01-24 ENCOUNTER — Other Ambulatory Visit: Payer: Self-pay | Admitting: Internal Medicine

## 2020-01-25 ENCOUNTER — Other Ambulatory Visit: Payer: Self-pay

## 2020-01-25 MED ORDER — ATORVASTATIN CALCIUM 10 MG PO TABS: ORAL_TABLET | ORAL | 2 refills | Status: DC

## 2020-01-26 ENCOUNTER — Other Ambulatory Visit: Payer: Self-pay

## 2020-01-26 MED ORDER — MELOXICAM 15 MG PO TABS: ORAL_TABLET | ORAL | 3 refills | Status: DC

## 2020-02-02 DIAGNOSIS — M7918 Myalgia, other site: Secondary | ICD-10-CM | POA: Diagnosis not present

## 2020-02-02 DIAGNOSIS — M533 Sacrococcygeal disorders, not elsewhere classified: Secondary | ICD-10-CM | POA: Diagnosis not present

## 2020-02-02 DIAGNOSIS — M5416 Radiculopathy, lumbar region: Secondary | ICD-10-CM | POA: Diagnosis not present

## 2020-02-08 DIAGNOSIS — M545 Low back pain: Secondary | ICD-10-CM | POA: Diagnosis not present

## 2020-02-12 DIAGNOSIS — M545 Low back pain: Secondary | ICD-10-CM | POA: Diagnosis not present

## 2020-02-17 DIAGNOSIS — M545 Low back pain: Secondary | ICD-10-CM | POA: Diagnosis not present

## 2020-02-19 DIAGNOSIS — M545 Low back pain: Secondary | ICD-10-CM | POA: Diagnosis not present

## 2020-02-22 DIAGNOSIS — M545 Low back pain: Secondary | ICD-10-CM | POA: Diagnosis not present

## 2020-02-24 DIAGNOSIS — M545 Low back pain: Secondary | ICD-10-CM | POA: Diagnosis not present

## 2020-02-26 DIAGNOSIS — M545 Low back pain: Secondary | ICD-10-CM | POA: Diagnosis not present

## 2020-03-01 ENCOUNTER — Telehealth: Payer: Self-pay

## 2020-03-01 NOTE — Telephone Encounter (Signed)
COMPLETED MEDICAL RECORD REQUEST AND FAXED REQUESTED RECORDS BACK TO [facility] [place].

## 2020-03-02 DIAGNOSIS — M545 Low back pain: Secondary | ICD-10-CM | POA: Diagnosis not present

## 2020-03-08 DIAGNOSIS — M545 Low back pain: Secondary | ICD-10-CM | POA: Diagnosis not present

## 2020-03-12 NOTE — Progress Notes (Signed)
Cardiology Office Note  Date:  03/14/2020   ID:  Amy Gilmore, DOB 09-19-1936, MRN [id number]  PCP:  Lavera Guise, MD   Chief Complaint  Patient presents with  . office visit    12 month F/U-Patient reports fluctuating BP readings; Meds verbally reviewed with patient.    HPI:  Amy Gilmore is a 84 year old woman with notes indicating a history of mitral valve prolapse,  palpitations and PACs,  hypertension  hyperlipidemia,  echocardiogram in 2011 did not mention mitral valve prolapse but did suggest mild, possible mild to moderate MR, normal ejection fraction. Prior smoking history, stopped 30 years ago She presents today for routine follow-up of her mitral valve regurgitation and APCs  In follow-up today she reports that she is doing relatively well BP up and down Takes propranolol as needed for elevated BP Bothersome to have to take it 3 times a day  Active Walking, golf, No orthostasis symptoms No regular exercise program Denies chest pain shortness of breath No tachycardia or palpitations  Lab work reviewed Total chol 148, LDL 65  EKG personally reviewed by myself on todays visit Shows normal sinus rhythm rate 72 bpm no significant ST or T wave changes    PMH:   has a past medical history of Adenomatous colon polyp (07/03/2012), Diverticulosis, Frequency of urination, History of kidney stones, Hyperlipidemia, Hypertension, Internal hemorrhoids, Mitral valve prolapse (per dr wall note 09/12--  stable), PAC (premature atrial contraction) (followed by cardiologist --  dr wall--  last visit  sept 12  note in epic--  states stable), Palpitations (hx pac's-   occasional), Renal calculus, right (followed by dr Karsten Ro), Right shoulder pain (Dec 07, 2011 fall), Urgency of micturation, and Vertigo, intermittent.  PSH:    Past Surgical History:  Procedure Laterality Date  . APPENDECTOMY  1955  . BUNIONECTOMY  1995   and osteotomy  . COLONOSCOPY  2017  .  CYSTOSCOPY  02/22/2012   Procedure: CYSTOSCOPY;  Surgeon: Claybon Jabs, MD;  [facility]: [facility];  Service: Urology;  Laterality: Right;  removal of ureteral stent  . FLEXIBLE SIGMOIDOSCOPY  08/12/2012   Procedure: FLEXIBLE SIGMOIDOSCOPY;  Surgeon: Inda Castle, MD;  Location: [facility];  Service: Endoscopy;  Laterality: N/A;  . NEPHROLITHOTOMY  01/28/2012-  right ureteral stent placement   Procedure: NEPHROLITHOTOMY PERCUTANEOUS;  Surgeon: Claybon Jabs, MD;  Location: [facility];  Service: Urology;  Laterality: Right;  . OTHER SURGICAL HISTORY  01/28/12   right percutaneous nephroliathothoma  . REFRACTIVE SURGERY  yrs ago  . [place] and 2006   right  . URETEROSCOPY  02/22/2012   Procedure: URETEROSCOPY;  Surgeon: Claybon Jabs, MD;  Location: [facility];  Service: Urology;  Laterality: Right;  URETEROSCOPY WITH STONE EXTRACTION   . VAGINAL HYSTERECTOMY  1985    Current Outpatient Medications  Medication Sig Dispense Refill  . acetaminophen (TYLENOL) 500 MG tablet Take 1,000 mg by mouth every 6 (six) hours as needed. Pain     . aspirin 81 MG tablet Take 81 mg by mouth daily.    Marland Kitchen atorvastatin (LIPITOR) 10 MG tablet TAKE 1 TABLET(10 MG) BY MOUTH AT BEDTIME 90 tablet 2  . Glucosamine 500 MG CAPS Take 500 mg by mouth daily.     . Hypromellose (GENTEAL) 0.3 % SOLN Place 1-2 drops into both eyes 2 (two) times daily as needed. Dry eyes     . ibandronate (BONIVA) 150 MG tablet Take  1 tablet (150 mg total) by mouth every 30 (thirty) days. Take in the morning with a full glass of water, on an empty stomach, and do not take anything else by mouth or lie down for the next 30 min. 3 tablet 1  . meclizine (ANTIVERT) 25 MG tablet Take 1 tablet (25 mg total) by mouth as needed. Inner ear 30 tablet 0  . meloxicam (MOBIC) 15 MG tablet TAKE 1 TABLET BY MOUTH EVERY DAY AFTER MEALS 30 tablet 3  . Multiple Vitamins-Minerals (CENTRUM SILVER) tablet Take 1  tablet by mouth daily.     . propranolol (INDERAL) 10 MG tablet Take one tab po tid for bp sys greater than 140 prn 90 tablet 3  . RESTASIS 0.05 % ophthalmic emulsion INSTILL 1 DROP INTO BOTH EYES TWICE A DAY  4  . valsartan-hydrochlorothiazide (DIOVAN-HCT) 320-12.5 MG tablet TAKE 1 TABLET BY MOUTH DAILY 90 tablet 1   Current Facility-Administered Medications  Medication Dose Route Frequency Provider Last Rate Last Admin  . 0.9 %  sodium chloride infusion  500 mL Intravenous Continuous Armbruster, Carlota Raspberry, MD         Allergies:   Codeine and Sulfa antibiotics   Social History:  The patient  reports that she quit smoking about 44 years ago. She has a 15.00 pack-year smoking history. She has never used smokeless tobacco. She reports current alcohol use of about 7.0 standard drinks of alcohol per week. She reports that she does not use drugs.   Family History:   family history includes Heart attack in her sister; Liver cancer in her mother; Peripheral vascular disease in her sister.    Review of Systems: Review of Systems  Constitutional: Negative.   HENT: Negative.   Respiratory: Negative.   Cardiovascular: Negative.   Gastrointestinal: Negative.        Indigestion  Musculoskeletal: Negative.   Neurological: Negative.   Psychiatric/Behavioral: Negative.   All other systems reviewed and are negative.    PHYSICAL EXAM: VS:  BP (!) 170/100 (BP Location: Left Arm, Patient Position: Sitting, Cuff Size: Normal)   Pulse 72   Ht 5\' 3"  (1.6 m)   Wt 122 lb (55.3 kg)   SpO2 96%   BMI 21.61 kg/m  , BMI Body mass index is 21.61 kg/m. Constitutional:  oriented to person, place, and time. No distress.  HENT:  Head: Grossly normal Eyes:  no discharge. No scleral icterus.  Neck: No JVD, no carotid bruits  Cardiovascular: Regular rate and rhythm, no murmurs appreciated Pulmonary/Chest: Clear to auscultation bilaterally, no wheezes or rails Abdominal: Soft.  no distension.  no  tenderness.  Musculoskeletal: Normal range of motion Neurological:  normal muscle tone. Coordination normal. No atrophy Skin: Skin warm and dry Psychiatric: normal affect, pleasant   Recent Labs: 11/10/2019: ALT 26; BUN 14; Creatinine, Ser 0.74; Hemoglobin 13.8; Platelets 371; Potassium 3.7; Sodium 140; TSH 3.630    Lipid Panel Lab Results  Component Value Date   CHOL 148 11/10/2019   HDL 65 11/10/2019   LDLCALC 65 11/10/2019   TRIG 98 11/10/2019      Wt Readings from Last 3 Encounters:  03/14/20 122 lb (55.3 kg)  10/13/19 128 lb (58.1 kg)  08/05/19 126 lb 9.6 oz (57.4 kg)     ASSESSMENT AND PLAN:  Essential hypertension - For the most part blood pressures 120s up to 150s Takes valsartan HCTZ Bothered by propranolol 3 times daily dosing that she takes as needed Recommend if she prefers a  One-A-Day as needed pill she could try amlodipine 2.5 mg Continue exercise  Mixed hyperlipidemia Cholesterol is at goal on the current lipid regimen. No changes to the medications were made.  Mitral valve insufficiency, unspecified etiology No significant murmur appreciated on exam No indication for repeat echoes Not having any symptoms  Palpitations She could take propranolol as needed Not a major issue on today's visit   Total encounter time more than 25 minutes  Greater than 50% was spent in counseling and coordination of care with the patient   Disposition:   F/U as needed   No orders of the defined types were placed in this encounter.    Signed, Esmond Plants, M.D., Ph.D. 03/14/2020  [place], [place]

## 2020-03-14 ENCOUNTER — Other Ambulatory Visit: Payer: Self-pay

## 2020-03-14 ENCOUNTER — Ambulatory Visit (INDEPENDENT_AMBULATORY_CARE_PROVIDER_SITE_OTHER): Payer: Medicare Other | Admitting: Cardiovascular Disease

## 2020-03-14 ENCOUNTER — Encounter: Payer: Self-pay | Admitting: Cardiovascular Disease

## 2020-03-14 VITALS — BP 170/100 | HR 72 | Ht 63.0 in | Wt 122.0 lb

## 2020-03-14 DIAGNOSIS — R002 Palpitations: Secondary | ICD-10-CM | POA: Diagnosis not present

## 2020-03-14 DIAGNOSIS — I10 Essential (primary) hypertension: Secondary | ICD-10-CM

## 2020-03-14 DIAGNOSIS — I340 Nonrheumatic mitral (valve) insufficiency: Secondary | ICD-10-CM

## 2020-03-14 DIAGNOSIS — E782 Mixed hyperlipidemia: Secondary | ICD-10-CM | POA: Diagnosis not present

## 2020-03-14 MED ORDER — AMLODIPINE BESYLATE 2.5 MG PO TABS: 2.5000 mg | ORAL_TABLET | Freq: Every day | ORAL | 3 refills | Status: DC | PRN

## 2020-03-14 NOTE — Patient Instructions (Addendum)
Medication Instructions:  For high pressure (orange/red) take amlodipine 2.5 mg daily as needed  If you need a refill on your cardiac medications before your next appointment, please call your pharmacy.    Lab work: No new labs needed   If you have labs (blood work) drawn today and your tests are completely normal, you will receive your results only by: Marland Kitchen MyChart Message (if you have MyChart) OR . A paper copy in the mail If you have any lab test that is abnormal or we need to change your treatment, we will call you to review the results.   Testing/Procedures: No new testing needed   Follow-Up: At [facility], you and your health needs are our priority.  As part of our continuing mission to provide you with exceptional heart care, we have created designated Provider Care Teams.  These Care Teams include your primary Cardiologist (physician) and Advanced Practice Providers (APPs -  Physician Assistants and Nurse Practitioners) who all work together to provide you with the care you need, when you need it.  . You will need a follow up appointment in 12 months  . Providers on your designated Care Team:   . Murray Hodgkins, NP . Christell Faith, PA-C . Marrianne Mood, PA-C  Any Other Special Instructions Will Be Listed Below (If Applicable).  For educational health videos Log in to : [email or web address] Or : [email or web address], password : [email or web address]

## 2020-03-23 DIAGNOSIS — M545 Low back pain: Secondary | ICD-10-CM | POA: Diagnosis not present

## 2020-04-01 ENCOUNTER — Telehealth: Payer: Self-pay

## 2020-04-01 NOTE — Telephone Encounter (Signed)
Lmom to confirm and screen for 04-05-20 ov.

## 2020-04-04 ENCOUNTER — Telehealth: Payer: Self-pay

## 2020-04-04 NOTE — Telephone Encounter (Signed)
Confirmed and screened for 04-05-20 ov. 

## 2020-04-05 ENCOUNTER — Ambulatory Visit (INDEPENDENT_AMBULATORY_CARE_PROVIDER_SITE_OTHER): Payer: Medicare Other | Admitting: Internal Medicine

## 2020-04-05 ENCOUNTER — Encounter: Payer: Self-pay | Admitting: Internal Medicine

## 2020-04-05 ENCOUNTER — Other Ambulatory Visit: Payer: Self-pay

## 2020-04-05 DIAGNOSIS — I10 Essential (primary) hypertension: Secondary | ICD-10-CM | POA: Diagnosis not present

## 2020-04-05 DIAGNOSIS — E782 Mixed hyperlipidemia: Secondary | ICD-10-CM

## 2020-04-05 DIAGNOSIS — M810 Age-related osteoporosis without current pathological fracture: Secondary | ICD-10-CM

## 2020-04-05 MED ORDER — VALSARTAN-HYDROCHLOROTHIAZIDE 320-12.5 MG PO TABS: 1.0000 | ORAL_TABLET | Freq: Every day | ORAL | 3 refills | Status: DC

## 2020-04-05 NOTE — Progress Notes (Signed)
[facility] [place], [place] [address]  Internal MEDICINE  Office Visit Note  Patient Name: Amy Gilmore  [id number]  [id number]  Date of Service: 04/10/2020  Chief Complaint  Patient presents with  . Hypertension  . Hyperlipidemia    HPI  Pt is here for routine follow up. Denies any complaints, overall bp is well controlled at home, she does have elevated BP here in the office. She sees cardiology on a regular basis. Needs refill on her Diovan/hctz. Pt is taking her Boniva now along with calcium and vitd   Current Medication: Outpatient Encounter Medications as of 04/05/2020  Medication Sig  . acetaminophen (TYLENOL) 500 MG tablet Take 1,000 mg by mouth every 6 (six) hours as needed. Pain   . amLODipine (NORVASC) 2.5 MG tablet Take 1 tablet (2.5 mg total) by mouth daily as needed.  Marland Kitchen aspirin 81 MG tablet Take 81 mg by mouth daily.  Marland Kitchen atorvastatin (LIPITOR) 10 MG tablet TAKE 1 TABLET(10 MG) BY MOUTH AT BEDTIME  . Hypromellose (GENTEAL) 0.3 % SOLN Place 1-2 drops into both eyes 2 (two) times daily as needed. Dry eyes   . ibandronate (BONIVA) 150 MG tablet Take 1 tablet (150 mg total) by mouth every 30 (thirty) days. Take in the morning with a full glass of water, on an empty stomach, and do not take anything else by mouth or lie down for the next 30 min.  . meloxicam (MOBIC) 15 MG tablet TAKE 1 TABLET BY MOUTH EVERY DAY AFTER MEALS  . Multiple Vitamins-Minerals (CENTRUM SILVER) tablet Take 1 tablet by mouth daily.   . RESTASIS 0.05 % ophthalmic emulsion INSTILL 1 DROP INTO BOTH EYES TWICE A DAY  . [DISCONTINUED] valsartan-hydrochlorothiazide (DIOVAN-HCT) 320-12.5 MG tablet TAKE 1 TABLET BY MOUTH DAILY  . valsartan-hydrochlorothiazide (DIOVAN HCT) 320-12.5 MG tablet Take 1 tablet by mouth daily. For HTN  . [DISCONTINUED] Glucosamine 500 MG CAPS Take 500 mg by mouth daily.   . [DISCONTINUED] meclizine (ANTIVERT) 25 MG tablet Take 1 tablet (25 mg  total) by mouth as needed. Inner ear (Patient not taking: Reported on 04/05/2020)  . [DISCONTINUED] propranolol (INDERAL) 10 MG tablet Take one tab po tid for bp sys greater than 140 prn (Patient not taking: Reported on 04/05/2020)   Facility-Administered Encounter Medications as of 04/05/2020  Medication  . 0.9 %  sodium chloride infusion    Surgical History: Past Surgical History:  Procedure Laterality Date  . APPENDECTOMY  1955  . BUNIONECTOMY  1995   and osteotomy  . COLONOSCOPY  2017  . CYSTOSCOPY  02/22/2012   Procedure: CYSTOSCOPY;  Surgeon: Claybon Jabs, MD;  Location: [facility];  Service: Urology;  Laterality: Right;  removal of ureteral stent  . FLEXIBLE SIGMOIDOSCOPY  08/12/2012   Procedure: FLEXIBLE SIGMOIDOSCOPY;  Surgeon: Inda Castle, MD;  Location: [facility];  Service: Endoscopy;  Laterality: N/A;  . NEPHROLITHOTOMY  01/28/2012-  right ureteral stent placement   Procedure: NEPHROLITHOTOMY PERCUTANEOUS;  Surgeon: Claybon Jabs, MD;  Location: [facility];  Service: Urology;  Laterality: Right;  . OTHER SURGICAL HISTORY  01/28/12   right percutaneous nephroliathothoma  . REFRACTIVE SURGERY  yrs ago  . [place] and 2006   right  . URETEROSCOPY  02/22/2012   Procedure: URETEROSCOPY;  Surgeon: Claybon Jabs, MD;  Location: [facility];  Service: Urology;  Laterality: Right;  URETEROSCOPY WITH STONE EXTRACTION   . [place]  Medical History: Past Medical History:  Diagnosis Date  . Adenomatous colon polyp 07/03/2012  . Diverticulosis   . Frequency of urination   . History of kidney stones   . Hyperlipidemia   . Hypertension   . Internal hemorrhoids   . Mitral valve prolapse per dr wall note 09/12--  stable  . PAC (premature atrial contraction) followed by cardiologist --  dr wall--  last visit  sept 12  note in epic--  states stable  . Palpitations hx pac's-   occasional  . Renal calculus, right  followed by dr Karsten Ro   February 2013  . Right shoulder pain Dec 07, 2011 fall   pain at times with positioning  . Urgency of micturation   . Vertigo, intermittent     Family History: Family History  Problem Relation Age of Onset  . Liver cancer Mother   . Heart attack Sister   . Peripheral vascular disease Sister   . Colon cancer Neg Hx   . Breast cancer Neg Hx     Social History   Socioeconomic History  . Marital status: Married    Spouse name: Not on file  . Number of children: 3  . Years of education: Not on file  . Highest education level: Not on file  Occupational History  . Occupation: Housewife  Tobacco Use  . Smoking status: Former Smoker    Packs/day: 1.00    Years: 15.00    Pack years: 15.00    Quit date: 12/04/1975    Years since quitting: 44.3  . Smokeless tobacco: Never Used  Substance and Sexual Activity  . Alcohol use: Yes    Alcohol/week: 7.0 standard drinks    Types: 7 Glasses of wine per week    Comment: 2-4 oz several times per week  . Drug use: No  . Sexual activity: Not on file  Other Topics Concern  . Not on file  Social History Narrative   Married.   Gets regular exercise.   Caffeine daily. 3 cups of coffee or soda per day   Social Determinants of Health   Financial Resource Strain:   . Difficulty of Paying Living Expenses:   Food Insecurity:   . Worried About Charity fundraiser in the Last Year:   . Arboriculturist in the Last Year:   Transportation Needs:   . Film/video editor (Medical):   Marland Kitchen Lack of Transportation (Non-Medical):   Physical Activity:   . Days of Exercise per Week:   . Minutes of Exercise per Session:   Stress:   . Feeling of Stress :   Social Connections:   . Frequency of Communication with Friends and Family:   . Frequency of Social Gatherings with Friends and Family:   . Attends Religious Services:   . Active Member of Clubs or Organizations:   . Attends Archivist Meetings:   Marland Kitchen Marital  Status:   Intimate Partner Violence:   . Fear of Current or Ex-Partner:   . Emotionally Abused:   Marland Kitchen Physically Abused:   . Sexually Abused:       Review of Systems  Constitutional: Negative for chills, diaphoresis and fatigue.  HENT: Negative for ear pain, postnasal drip and sinus pressure.   Respiratory: Negative for cough, shortness of breath and wheezing.   Cardiovascular: Negative for chest pain and palpitations.  Gastrointestinal: Negative for constipation and nausea.  Genitourinary: Negative for dysuria and flank pain.  Musculoskeletal: Positive for arthralgias. Negative  for neck pain.  Skin: Negative for color change.  Allergic/Immunologic: Negative for environmental allergies and food allergies.  Neurological: Negative for dizziness and headaches.  Psychiatric/Behavioral: Negative for agitation, behavioral problems (depression) and hallucinations.    Vital Signs: BP 133/75   Pulse 78   Temp 98.4 F (36.9 C)   Resp 16   Ht 5\' 3"  (1.6 m)   Wt 121 lb (54.9 kg)   SpO2 97%   BMI 21.43 kg/m    Physical Exam Constitutional:      General: She is not in acute distress.    Appearance: She is well-developed. She is not diaphoretic.  HENT:     Head: Normocephalic and atraumatic.     Mouth/Throat:     Pharynx: No oropharyngeal exudate.  Eyes:     Pupils: Pupils are equal, round, and reactive to light.  Neck:     Thyroid: No thyromegaly.     Vascular: No JVD.     Trachea: No tracheal deviation.  Cardiovascular:     Rate and Rhythm: Normal rate and regular rhythm.     Heart sounds: Normal heart sounds. No murmur. No friction rub. No gallop.   Pulmonary:     Effort: Pulmonary effort is normal. No respiratory distress.     Breath sounds: No wheezing or rales.  Chest:     Chest wall: No tenderness.  Abdominal:     General: Bowel sounds are normal.     Palpations: Abdomen is soft.  Musculoskeletal:        General: Normal range of motion.     Cervical back: Normal  range of motion and neck supple.  Lymphadenopathy:     Cervical: No cervical adenopathy.  Skin:    General: Skin is warm and dry.  Neurological:     Mental Status: She is alert and oriented to person, place, and time.     Cranial Nerves: No cranial nerve deficit.  Psychiatric:        Behavior: Behavior normal.        Thought Content: Thought content normal.        Judgment: Judgment normal.    Assessment/Plan: 1. Essential hypertension - Continue to monitor her bp at home. She does have RX of norvasc to be taken prn - valsartan-hydrochlorothiazide (DIOVAN HCT) 320-12.5 MG tablet; Take 1 tablet by mouth daily. For HTN  Dispense: 90 tablet; Refill: 3  2. Mixed hyperlipidemia - Controlled with Lipitor  3. Osteoporosis, senile - Continue Boniva   General Counseling: Charleigh verbalizes understanding of the findings of todays visit and agrees with plan of treatment. I have discussed any further diagnostic evaluation that may be needed or ordered today. We also reviewed her medications today. she has been encouraged to call the office with any questions or concerns that should arise related to todays visit.  Meds ordered this encounter  Medications  . valsartan-hydrochlorothiazide (DIOVAN HCT) 320-12.5 MG tablet    Sig: Take 1 tablet by mouth daily. For HTN    Dispense:  90 tablet    Refill:  3    Total time spent:30  Minutes Time spent includes review of chart, medications, test results, and follow up plan with the patient.   Dr Lavera Guise Internal medicine

## 2020-04-10 ENCOUNTER — Encounter: Payer: Self-pay | Admitting: Internal Medicine

## 2020-04-12 ENCOUNTER — Ambulatory Visit: Payer: Medicare Other | Admitting: Internal Medicine

## 2020-07-26 DIAGNOSIS — M810 Age-related osteoporosis without current pathological fracture: Secondary | ICD-10-CM | POA: Diagnosis not present

## 2020-08-03 DIAGNOSIS — I10 Essential (primary) hypertension: Secondary | ICD-10-CM | POA: Diagnosis not present

## 2020-08-03 DIAGNOSIS — M810 Age-related osteoporosis without current pathological fracture: Secondary | ICD-10-CM | POA: Diagnosis not present

## 2020-08-30 DIAGNOSIS — H353132 Nonexudative age-related macular degeneration, bilateral, intermediate dry stage: Secondary | ICD-10-CM | POA: Diagnosis not present

## 2020-09-02 DIAGNOSIS — Z23 Encounter for immunization: Secondary | ICD-10-CM | POA: Diagnosis not present

## 2020-09-07 ENCOUNTER — Other Ambulatory Visit: Payer: Self-pay | Admitting: Internal Medicine

## 2020-09-07 DIAGNOSIS — Z1231 Encounter for screening mammogram for malignant neoplasm of breast: Secondary | ICD-10-CM

## 2020-09-13 DIAGNOSIS — L570 Actinic keratosis: Secondary | ICD-10-CM | POA: Diagnosis not present

## 2020-09-13 DIAGNOSIS — L738 Other specified follicular disorders: Secondary | ICD-10-CM | POA: Diagnosis not present

## 2020-09-13 DIAGNOSIS — D1801 Hemangioma of skin and subcutaneous tissue: Secondary | ICD-10-CM | POA: Diagnosis not present

## 2020-09-13 DIAGNOSIS — L821 Other seborrheic keratosis: Secondary | ICD-10-CM | POA: Diagnosis not present

## 2020-09-29 DIAGNOSIS — Z23 Encounter for immunization: Secondary | ICD-10-CM | POA: Diagnosis not present

## 2020-10-18 ENCOUNTER — Ambulatory Visit (INDEPENDENT_AMBULATORY_CARE_PROVIDER_SITE_OTHER): Payer: Medicare Other | Admitting: Internal Medicine

## 2020-10-18 ENCOUNTER — Other Ambulatory Visit: Payer: Self-pay

## 2020-10-18 ENCOUNTER — Encounter: Payer: Self-pay | Admitting: Internal Medicine

## 2020-10-18 VITALS — BP 132/75 | HR 80 | Temp 97.7°F | Resp 16 | Ht 62.0 in | Wt 119.8 lb

## 2020-10-18 DIAGNOSIS — M810 Age-related osteoporosis without current pathological fracture: Secondary | ICD-10-CM | POA: Diagnosis not present

## 2020-10-18 DIAGNOSIS — Z0001 Encounter for general adult medical examination with abnormal findings: Secondary | ICD-10-CM | POA: Diagnosis not present

## 2020-10-18 DIAGNOSIS — I10 Essential (primary) hypertension: Secondary | ICD-10-CM | POA: Diagnosis not present

## 2020-10-18 DIAGNOSIS — R300 Dysuria: Secondary | ICD-10-CM

## 2020-10-18 DIAGNOSIS — R2689 Other abnormalities of gait and mobility: Secondary | ICD-10-CM

## 2020-10-18 DIAGNOSIS — E782 Mixed hyperlipidemia: Secondary | ICD-10-CM

## 2020-10-18 NOTE — Progress Notes (Signed)
[facility] [place], [place] [address]  Internal MEDICINE  Office Visit Note  Patient Name: Amy Gilmore  [id number]  [id number]  Date of Service: 10/18/2020  Chief Complaint  Patient presents with  . Medicare Wellness  . Hyperlipidemia  . Hypertension  . policy update form    received     HPI Pt is here for routine health maintenance examination. Her only complaints is that she feels off balance at times. She does not describe it as dizzy spell but more like equilibrium issue. No c/p or sob or palpitations. BP is always elevated in the office but at home readings are reviewed and are within normal range. She sees her cardiologist on a regular basis. Scheduled to have a mammogram. She does not wish to continue with colon cancer screening   Does take multiple vitamins. She is taking Boniva regularly, no side effects noticed, she continues to play golf   Current Medication: Outpatient Encounter Medications as of 10/18/2020  Medication Sig  . acetaminophen (TYLENOL) 500 MG tablet Take 1,000 mg by mouth every 6 (six) hours as needed. Pain   . aspirin 81 MG tablet Take 81 mg by mouth daily.  Marland Kitchen atorvastatin (LIPITOR) 10 MG tablet TAKE 1 TABLET(10 MG) BY MOUTH AT BEDTIME  . Hypromellose (GENTEAL) 0.3 % SOLN Place 1-2 drops into both eyes 2 (two) times daily as needed. Dry eyes   . ibandronate (BONIVA) 150 MG tablet Take 1 tablet (150 mg total) by mouth every 30 (thirty) days. Take in the morning with a full glass of water, on an empty stomach, and do not take anything else by mouth or lie down for the next 30 min.  . Multiple Vitamins-Minerals (CENTRUM SILVER) tablet Take 1 tablet by mouth daily.   . RESTASIS 0.05 % ophthalmic emulsion INSTILL 1 DROP INTO BOTH EYES TWICE A DAY  . valsartan-hydrochlorothiazide (DIOVAN HCT) 320-12.5 MG tablet Take 1 tablet by mouth daily. For HTN  . [DISCONTINUED] amLODipine (NORVASC) 2.5 MG tablet Take 1 tablet (2.5  mg total) by mouth daily as needed.  . [DISCONTINUED] meloxicam (MOBIC) 15 MG tablet TAKE 1 TABLET BY MOUTH EVERY DAY AFTER MEALS  . [DISCONTINUED] 0.9 %  sodium chloride infusion    No facility-administered encounter medications on file as of 10/18/2020.    Surgical History: Past Surgical History:  Procedure Laterality Date  . APPENDECTOMY  1955  . BUNIONECTOMY  1995   and osteotomy  . COLONOSCOPY  2017  . CYSTOSCOPY  02/22/2012   Procedure: CYSTOSCOPY;  Surgeon: Claybon Jabs, MD;  [facility]: [facility];  Service: Urology;  Laterality: Right;  removal of ureteral stent  . FLEXIBLE SIGMOIDOSCOPY  08/12/2012   Procedure: FLEXIBLE SIGMOIDOSCOPY;  Surgeon: Inda Castle, MD;  Location: [facility];  Service: Endoscopy;  Laterality: N/A;  . NEPHROLITHOTOMY  01/28/2012-  right ureteral stent placement   Procedure: NEPHROLITHOTOMY PERCUTANEOUS;  Surgeon: Claybon Jabs, MD;  Location: [facility];  Service: Urology;  Laterality: Right;  . OTHER SURGICAL HISTORY  01/28/12   right percutaneous nephroliathothoma  . REFRACTIVE SURGERY  yrs ago  . [place] and 2006   right  . URETEROSCOPY  02/22/2012   Procedure: URETEROSCOPY;  Surgeon: Claybon Jabs, MD;  Location: [facility];  Service: Urology;  Laterality: Right;  URETEROSCOPY WITH STONE EXTRACTION   . VAGINAL HYSTERECTOMY  1985    Medical History: Past Medical History:  Diagnosis Date  .  Adenomatous colon polyp 07/03/2012  . Diverticulosis   . Frequency of urination   . History of kidney stones   . Hyperlipidemia   . Hypertension   . Internal hemorrhoids   . Mitral valve prolapse per dr wall note 09/12--  stable  . PAC (premature atrial contraction) followed by cardiologist --  dr wall--  last visit  sept 12  note in epic--  states stable  . Palpitations hx pac's-   occasional  . Renal calculus, right followed by dr Karsten Ro   February 2013  . Right shoulder pain Dec 07, 2011 fall   pain  at times with positioning  . Urgency of micturation   . Vertigo, intermittent     Family History: Family History  Problem Relation Age of Onset  . Liver cancer Mother   . Heart attack Sister   . Peripheral vascular disease Sister   . Colon cancer Neg Hx   . Breast cancer Neg Hx       Review of Systems  Constitutional: Negative for chills, diaphoresis and fatigue.  HENT: Negative for ear pain, postnasal drip and sinus pressure.   Eyes: Negative for photophobia, discharge, redness, itching and visual disturbance.  Respiratory: Negative for cough, shortness of breath and wheezing.   Cardiovascular: Negative for chest pain, palpitations and leg swelling.  Gastrointestinal: Negative for abdominal pain, constipation, diarrhea, nausea and vomiting.  Genitourinary: Negative for dysuria and flank pain.  Musculoskeletal: Negative for arthralgias, back pain, gait problem and neck pain.  Skin: Negative for color change.  Allergic/Immunologic: Negative for environmental allergies and food allergies.  Neurological: Negative for dizziness and headaches.  Hematological: Does not bruise/bleed easily.  Psychiatric/Behavioral: Negative for agitation, behavioral problems (depression) and hallucinations.     Vital Signs: BP 132/75 Comment: 172/100  Pulse 80   Temp 97.7 F (36.5 C)   Resp 16   Ht 5\' 2"  (1.575 m)   Wt 119 lb 12.8 oz (54.3 kg)   SpO2 98%   BMI 21.91 kg/m    Physical Exam Constitutional:      General: She is not in acute distress.    Appearance: She is well-developed. She is not diaphoretic.  HENT:     Head: Normocephalic and atraumatic.     Right Ear: Tympanic membrane and external ear normal.     Left Ear: Tympanic membrane and external ear normal.     Nose: Nose normal.     Mouth/Throat:     Pharynx: No oropharyngeal exudate.  Eyes:     General: No scleral icterus.       Right eye: No discharge.        Left eye: No discharge.     Extraocular Movements:  Extraocular movements intact.     Conjunctiva/sclera: Conjunctivae normal.     Pupils: Pupils are equal, round, and reactive to light.  Neck:     Thyroid: No thyromegaly.     Vascular: No JVD.     Trachea: No tracheal deviation.  Cardiovascular:     Rate and Rhythm: Normal rate and regular rhythm.     Heart sounds: Normal heart sounds. No murmur heard.  No friction rub. No gallop.   Pulmonary:     Effort: Pulmonary effort is normal. No respiratory distress.     Breath sounds: Normal breath sounds. No stridor. No wheezing or rales.  Chest:     Chest wall: No tenderness.  Abdominal:     General: Bowel sounds are normal. There is no distension.  Palpations: Abdomen is soft. There is no mass.     Tenderness: There is no abdominal tenderness. There is no guarding or rebound.  Musculoskeletal:        General: No tenderness or deformity. Normal range of motion.     Cervical back: Normal range of motion and neck supple.  Lymphadenopathy:     Cervical: No cervical adenopathy.  Skin:    General: Skin is warm and dry.     Coloration: Skin is not pale.     Findings: No erythema or rash.  Neurological:     Mental Status: She is alert and oriented to person, place, and time.     Cranial Nerves: No cranial nerve deficit.     Motor: No abnormal muscle tone.     Coordination: Coordination normal.     Deep Tendon Reflexes: Reflexes are normal and symmetric.  Psychiatric:        Behavior: Behavior normal.        Thought Content: Thought content normal.        Judgment: Judgment normal.       Assessment/Plan: 1. Encounter for general adult medical examination with abnormal findings Age appropriate PHM is ordered and updated   2. Mixed hyperlipidemia Continue Lipitor as before  - Lipid Panel With LDL/HDL Ratio   3. Essential hypertension, benign BP is elevated in the office, Continues to monitor at home, home readings are within range ( can be cause of Imbalance but she thinks her  bp is only high in doctor's office   4. Imbalance I instructed to stop all OTC vitamins, monitor symptoms, might need to see PT for gait and balance training    5. Senile osteoporosis Continue Boniva, calcium and Vit d  6. Dysuria Urine for Urinalysis   General Counseling: Jayla verbalizes understanding of the findings of todays visit and agrees with plan of treatment. I have discussed any further diagnostic evaluation that may be needed or ordered today. We also reviewed her medications today. she has been encouraged to call the office with any questions or concerns that should arise related to todays visit.  Counseling: Hypertension Counseling:   The following hypertensive lifestyle modification were recommended and discussed:  1. Limiting alcohol intake to less than 1 oz/day of ethanol:(24 oz of beer or 8 oz of wine or 2 oz of 100-proof whiskey). 2. Take baby ASA 81 mg daily. 3. Importance of regular aerobic exercise and losing weight. 4. Reduce dietary saturated fat and cholesterol intake for overall cardiovascular health. 5. Maintaining adequate dietary potassium, calcium, and magnesium intake. 6. Regular monitoring of the blood pressure. 7. Reduce sodium intake to less than 100 mmol/day (less than 2.3 gm of sodium or less than 6 gm of sodium choride)    Orders Placed This Encounter  Procedures  . CBC with Differential/Platelet  . Lipid Panel With LDL/HDL Ratio  . TSH  . T4, free  . Comprehensive metabolic panel      Total time spent:35 Minutes  Time spent includes review of chart, medications, test results, and follow up plan with the patient.     Lavera Guise, MD  Internal Medicine

## 2020-10-25 ENCOUNTER — Other Ambulatory Visit: Payer: Self-pay | Admitting: Internal Medicine

## 2020-11-10 DIAGNOSIS — R300 Dysuria: Secondary | ICD-10-CM | POA: Diagnosis not present

## 2020-11-10 DIAGNOSIS — Z0001 Encounter for general adult medical examination with abnormal findings: Secondary | ICD-10-CM | POA: Diagnosis not present

## 2020-11-10 DIAGNOSIS — I10 Essential (primary) hypertension: Secondary | ICD-10-CM | POA: Diagnosis not present

## 2020-11-10 DIAGNOSIS — E782 Mixed hyperlipidemia: Secondary | ICD-10-CM | POA: Diagnosis not present

## 2020-11-10 DIAGNOSIS — M810 Age-related osteoporosis without current pathological fracture: Secondary | ICD-10-CM | POA: Diagnosis not present

## 2020-11-11 ENCOUNTER — Ambulatory Visit
Admission: RE | Admit: 2020-11-11 | Discharge: 2020-11-11 | Disposition: A | Discharge status: Home / Self Care | Payer: Medicare Other | Source: Ambulatory Visit | Attending: Internal Medicine | Admitting: Internal Medicine

## 2020-11-11 ENCOUNTER — Other Ambulatory Visit: Payer: Self-pay

## 2020-11-11 DIAGNOSIS — Z1231 Encounter for screening mammogram for malignant neoplasm of breast: Secondary | ICD-10-CM | POA: Diagnosis not present

## 2020-11-11 LAB — COMPREHENSIVE METABOLIC PANEL
ALT: 21 IU/L (ref 0–32)
AST: 23 IU/L (ref 0–40)
Albumin/Globulin Ratio: 1.9 (ref 1.2–2.2)
Albumin: 4.6 g/dL (ref 3.6–4.6)
Alkaline Phosphatase: 64 IU/L (ref 44–121)
BUN/Creatinine Ratio: 19 (ref 12–28)
BUN: 13 mg/dL (ref 8–27)
Bilirubin Total: 0.7 mg/dL (ref 0.0–1.2)
CO2: 25 mmol/L (ref 20–29)
Calcium: 9.6 mg/dL (ref 8.7–10.3)
Chloride: 103 mmol/L (ref 96–106)
Creatinine, Ser: 0.69 mg/dL (ref 0.57–1.00)
GFR calc Af Amer: 94 mL/min/{1.73_m2} (ref >59)
GFR calc non Af Amer: 81 mL/min/{1.73_m2} (ref >59)
Globulin, Total: 2.4 g/dL (ref 1.5–4.5)
Glucose: 106 mg/dL — ABNORMAL HIGH (ref 65–99)
Potassium: 3.7 mmol/L (ref 3.5–5.2)
Sodium: 142 mmol/L (ref 134–144)
Total Protein: 7 g/dL (ref 6.0–8.5)

## 2020-11-11 LAB — CBC WITH DIFFERENTIAL/PLATELET
Basophils Absolute: 0.1 10*3/uL (ref 0.0–0.2)
Basos: 1 %
EOS (ABSOLUTE): 0.2 10*3/uL (ref 0.0–0.4)
Eos: 3 %
Hematocrit: 40.5 % (ref 34.0–46.6)
Hemoglobin: 13.9 g/dL (ref 11.1–15.9)
Immature Grans (Abs): 0 10*3/uL (ref 0.0–0.1)
Immature Granulocytes: 0 %
Lymphocytes Absolute: 1.8 10*3/uL (ref 0.7–3.1)
Lymphs: 27 %
MCH: 33.1 pg — ABNORMAL HIGH (ref 26.6–33.0)
MCHC: 34.3 g/dL (ref 31.5–35.7)
MCV: 96 fL (ref 79–97)
Monocytes Absolute: 0.7 10*3/uL (ref 0.1–0.9)
Monocytes: 10 %
Neutrophils Absolute: 4 10*3/uL (ref 1.4–7.0)
Neutrophils: 59 %
Platelets: 344 10*3/uL (ref 150–450)
RBC: 4.2 x10E6/uL (ref 3.77–5.28)
RDW: 12.3 % (ref 11.7–15.4)
WBC: 6.8 10*3/uL (ref 3.4–10.8)

## 2020-11-11 LAB — TSH: TSH: 3.76 u[IU]/mL (ref 0.450–4.500)

## 2020-11-11 LAB — T4, FREE: Free T4: 1.38 ng/dL (ref 0.82–1.77)

## 2020-11-11 LAB — LIPID PANEL WITH LDL/HDL RATIO
Cholesterol, Total: 181 mg/dL (ref 100–199)
HDL: 73 mg/dL (ref >39)
LDL Chol Calc (NIH): 94 mg/dL (ref 0–99)
LDL/HDL Ratio: 1.3 ratio (ref 0.0–3.2)
Triglycerides: 77 mg/dL (ref 0–149)
VLDL Cholesterol Cal: 14 mg/dL (ref 5–40)

## 2020-11-14 NOTE — Progress Notes (Signed)
Labs are reviewed and are normal

## 2020-12-14 DIAGNOSIS — M65331 Trigger finger, right middle finger: Secondary | ICD-10-CM | POA: Diagnosis not present

## 2020-12-14 DIAGNOSIS — M65849 Other synovitis and tenosynovitis, unspecified hand: Secondary | ICD-10-CM | POA: Diagnosis not present

## 2020-12-14 DIAGNOSIS — M65841 Other synovitis and tenosynovitis, right hand: Secondary | ICD-10-CM | POA: Diagnosis not present

## 2020-12-14 DIAGNOSIS — M65341 Trigger finger, right ring finger: Secondary | ICD-10-CM | POA: Diagnosis not present

## 2020-12-14 DIAGNOSIS — M79641 Pain in right hand: Secondary | ICD-10-CM | POA: Diagnosis not present

## 2021-02-21 ENCOUNTER — Telehealth: Payer: Self-pay

## 2021-02-21 MED ORDER — ALPRAZOLAM 0.25 MG PO TABS: 0.2500 mg | ORAL_TABLET | Freq: Two times a day (BID) | ORAL | 0 refills | Status: DC | PRN

## 2021-02-21 NOTE — Telephone Encounter (Signed)
LMOM for pt to check pharmacy.

## 2021-02-23 ENCOUNTER — Ambulatory Visit (INDEPENDENT_AMBULATORY_CARE_PROVIDER_SITE_OTHER): Payer: Medicare Other | Admitting: Physician Assistant

## 2021-02-23 ENCOUNTER — Other Ambulatory Visit: Payer: Self-pay

## 2021-02-23 ENCOUNTER — Encounter: Payer: Self-pay | Admitting: Physician Assistant

## 2021-02-23 DIAGNOSIS — F320 Major depressive disorder, single episode, mild: Secondary | ICD-10-CM | POA: Diagnosis not present

## 2021-02-23 DIAGNOSIS — I10 Essential (primary) hypertension: Secondary | ICD-10-CM

## 2021-02-23 MED ORDER — ESCITALOPRAM OXALATE 5 MG PO TABS: 5.0000 mg | ORAL_TABLET | Freq: Every day | ORAL | 2 refills | Status: DC

## 2021-02-23 NOTE — Progress Notes (Signed)
[facility] [place], [place] [address]  Internal MEDICINE  Office Visit Note  Patient Name: Amy Gilmore  [id number]  [id number]  Date of Service: 02/26/2021  Chief Complaint  Patient presents with  . Depression    Going on for few months   . Hypertension  . Hyperlipidemia     HPI Pt is here for a sick visit. -Past few months has felt jittery/nervous and can cry at the drop of the hat. Sleeping well. She also got a labradoodle that she shares with her son that has been helping. Denies any anxiety. When she holds her hands out they are steady so she states she "knows it is internal." Thinks her mood has just been "off." She denies any changes in appetite, sleep, or routine. -Did not want to take xanax since she does not feel she has anxiety, just gets nervous and feels depressed.  -BP is normally well controlled at home, it was 133/85 at home this AM.    Current Medication:  Outpatient Encounter Medications as of 02/23/2021  Medication Sig  . acetaminophen (TYLENOL) 500 MG tablet Take 1,000 mg by mouth every 6 (six) hours as needed. Pain   . ALPRAZolam (XANAX) 0.25 MG tablet Take 1 tablet (0.25 mg total) by mouth 2 (two) times daily as needed for anxiety.  Marland Kitchen aspirin 81 MG tablet Take 81 mg by mouth daily.  Marland Kitchen atorvastatin (LIPITOR) 10 MG tablet TAKE 1 TABLET(10 MG) BY MOUTH AT BEDTIME  . escitalopram (LEXAPRO) 5 MG tablet Take 1 tablet (5 mg total) by mouth daily.  . Hypromellose 0.3 % SOLN Place 1-2 drops into both eyes 2 (two) times daily as needed. Dry eyes   . ibandronate (BONIVA) 150 MG tablet Take 1 tablet (150 mg total) by mouth every 30 (thirty) days. Take in the morning with a full glass of water, on an empty stomach, and do not take anything else by mouth or lie down for the next 30 min.  . Multiple Vitamins-Minerals (CENTRUM SILVER) tablet Take 1 tablet by mouth daily.  . RESTASIS 0.05 % ophthalmic emulsion INSTILL 1 DROP INTO BOTH  EYES TWICE A DAY  . valsartan-hydrochlorothiazide (DIOVAN HCT) 320-12.5 MG tablet Take 1 tablet by mouth daily. For HTN   No facility-administered encounter medications on file as of 02/23/2021.      Medical History: Past Medical History:  Diagnosis Date  . Adenomatous colon polyp 07/03/2012  . Diverticulosis   . Frequency of urination   . History of kidney stones   . Hyperlipidemia   . Hypertension   . Internal hemorrhoids   . Mitral valve prolapse per dr wall note 09/12--  stable  . PAC (premature atrial contraction) followed by cardiologist --  dr wall--  last visit  sept 12  note in epic--  states stable  . Palpitations hx pac's-   occasional  . Renal calculus, right followed by dr Karsten Ro   February 2013  . Right shoulder pain Dec 07, 2011 fall   pain at times with positioning  . Urgency of micturation   . Vertigo, intermittent      Vital Signs: BP (!) 150/90   Pulse 83   Temp 97.9 F (36.6 C)   Resp 16   Ht 5\' 2"  (1.575 m)   Wt 118 lb (53.5 kg)   SpO2 97%   BMI 21.58 kg/m    Review of Systems  Constitutional: Negative for activity change, appetite change, fatigue, fever and unexpected  weight change.  HENT: Negative for congestion, mouth sores and postnasal drip.   Respiratory: Negative for cough.   Cardiovascular: Negative for chest pain.  Genitourinary: Negative for flank pain.  Psychiatric/Behavioral: Positive for behavioral problems. Negative for sleep disturbance and suicidal ideas. The patient is nervous/anxious.     Physical Exam Constitutional:      General: She is not in acute distress.    Appearance: She is well-developed and normal weight. She is not diaphoretic.  HENT:     Head: Normocephalic and atraumatic.     Mouth/Throat:     Pharynx: No oropharyngeal exudate.  Eyes:     Pupils: Pupils are equal, round, and reactive to light.  Neck:     Thyroid: No thyromegaly.     Vascular: No JVD.     Trachea: No tracheal deviation.  Cardiovascular:      Rate and Rhythm: Normal rate and regular rhythm.     Heart sounds: Normal heart sounds. No murmur heard. No friction rub. No gallop.   Pulmonary:     Effort: Pulmonary effort is normal. No respiratory distress.     Breath sounds: No wheezing or rales.  Chest:     Chest wall: No tenderness.  Abdominal:     General: Bowel sounds are normal.     Palpations: Abdomen is soft.  Musculoskeletal:        General: Normal range of motion.     Cervical back: Normal range of motion and neck supple.  Lymphadenopathy:     Cervical: No cervical adenopathy.  Skin:    General: Skin is warm and dry.  Neurological:     Mental Status: She is alert and oriented to person, place, and time.     Cranial Nerves: No cranial nerve deficit.  Psychiatric:        Behavior: Behavior normal.        Thought Content: Thought content normal.        Judgment: Judgment normal.       Assessment/Plan: 1. Depression, major, single episode, mild ([place]) Start on Lexapro daily. Pt agreed to try the lowest dose to help improve mood/nercous feeling, despite denying anxiety. She is resistant to medication as well as counseling. Discussed that we may need to increase the dose for it to have an effect, but will start low and adjust accordingly. - escitalopram (LEXAPRO) 5 MG tablet; Take 1 tablet (5 mg total) by mouth daily.  Dispense: 30 tablet; Refill: 2  2. Essential hypertension Pt normally well controlled at home, but was nervous coming in to discuss mood changes today. She will monitor closely and continue diovan-hct.   General Counseling: Ellamae verbalizes understanding of the findings of todays visit and agrees with plan of treatment. I have discussed any further diagnostic evaluation that may be needed or ordered today. We also reviewed her medications today. she has been encouraged to call the office with any questions or concerns that should arise related to todays visit.    Counseling:    No orders of  the defined types were placed in this encounter.   Meds ordered this encounter  Medications  . escitalopram (LEXAPRO) 5 MG tablet    Sig: Take 1 tablet (5 mg total) by mouth daily.    Dispense:  30 tablet    Refill:  2    Time spent:30 Minutes

## 2021-03-02 ENCOUNTER — Telehealth: Payer: Self-pay

## 2021-03-02 NOTE — Telephone Encounter (Signed)
Called pt and checked how she was doing.  Pt stated that she is doing fine and they have just got back from a trip.  She advised that she hasn't even had to take one of the pills.  She stated that if she ever got nervous again like before or anything she may take one.

## 2021-03-02 NOTE — Telephone Encounter (Signed)
-----   Message from Lavera Guise, MD sent at 03/02/2021 10:01 AM EDT ----- Can u pls call her and see how is she feeling from me and Ander Purpura

## 2021-03-06 DIAGNOSIS — H353132 Nonexudative age-related macular degeneration, bilateral, intermediate dry stage: Secondary | ICD-10-CM | POA: Diagnosis not present

## 2021-03-23 ENCOUNTER — Ambulatory Visit: Payer: Self-pay | Admitting: Physician Assistant

## 2021-04-18 ENCOUNTER — Encounter: Payer: Self-pay | Admitting: Internal Medicine

## 2021-04-18 ENCOUNTER — Ambulatory Visit (INDEPENDENT_AMBULATORY_CARE_PROVIDER_SITE_OTHER): Payer: Medicare Other | Admitting: Internal Medicine

## 2021-04-18 ENCOUNTER — Other Ambulatory Visit: Payer: Self-pay

## 2021-04-18 VITALS — BP 120/60 | HR 75 | Temp 97.3°F | Resp 16 | Ht 63.0 in | Wt 117.2 lb

## 2021-04-18 DIAGNOSIS — I10 Essential (primary) hypertension: Secondary | ICD-10-CM

## 2021-04-18 DIAGNOSIS — F320 Major depressive disorder, single episode, mild: Secondary | ICD-10-CM | POA: Diagnosis not present

## 2021-04-18 DIAGNOSIS — G9519 Other vascular myelopathies: Secondary | ICD-10-CM

## 2021-04-18 DIAGNOSIS — E782 Mixed hyperlipidemia: Secondary | ICD-10-CM | POA: Diagnosis not present

## 2021-04-18 MED ORDER — MELOXICAM 15 MG PO TABS: ORAL_TABLET | ORAL | 3 refills | Status: DC

## 2021-04-18 NOTE — Progress Notes (Signed)
Amy Gilmore, Amy Gilmore  [id number]  [id number]  Date of Service: 04/23/2021  Chief Complaint  Patient presents with  . Follow-up    Review labs, discuss meds  . Hyperlipidemia  . Hypertension    HPI  [name] is here for routine follow-up.  She feels well.  Loves playing golf.  Lately she did notice that her legs get heavy and may be weak, she does have degenerative joint disease and mild spinal stenosis on an MRI done almost 10 years ago she has seen ortho and neurosurgery in the past.  Initially her blood pressure is always elevated here in the office however home readings are normal. Patient did have some anxiety due to war in [place], she thinks she is feeling a little better and trying to cope with that. She has osteoporosis takes Boniva once a month without any side effects denies any current symptoms with it. Patient also have a hyperlipidemia and on Lipitor 10 mg and has been compliant with this medication. Patient also has hypertension and MVP has seen cardiology in the past blood pressure is under good control    Current Medication: Outpatient Encounter Medications as of 04/18/2021  Medication Sig  . acetaminophen (TYLENOL) 500 MG tablet Take 1,000 mg by mouth every 6 (six) hours as needed. Pain   . aspirin 81 MG tablet Take 81 mg by mouth daily.  Marland Kitchen atorvastatin (LIPITOR) 10 MG tablet TAKE 1 TABLET(10 MG) BY MOUTH AT BEDTIME  . Hypromellose 0.3 % SOLN Place 1-2 drops into both eyes 2 (two) times daily as needed. Dry eyes   . ibandronate (BONIVA) 150 MG tablet Take 1 tablet (150 mg total) by mouth every 30 (thirty) days. Take in the morning with a full glass of water, on an empty stomach, and do not take anything else by mouth or lie down for the next 30 min.  . meloxicam (MOBIC) 15 MG tablet Take one tab po qd prn for arthritis  . Multiple  Vitamins-Minerals (CENTRUM SILVER) tablet Take 1 tablet by mouth daily.  . RESTASIS 0.05 % ophthalmic emulsion INSTILL 1 DROP INTO BOTH EYES TWICE A DAY  . valsartan-hydrochlorothiazide (DIOVAN HCT) 320-12.5 MG tablet Take 1 tablet by mouth daily. For HTN  . [DISCONTINUED] ALPRAZolam (XANAX) 0.25 MG tablet Take 1 tablet (0.25 mg total) by mouth 2 (two) times daily as needed for anxiety. (Patient not taking: Reported on 04/18/2021)  . [DISCONTINUED] escitalopram (LEXAPRO) 5 MG tablet Take 1 tablet (5 mg total) by mouth daily. (Patient not taking: Reported on 04/18/2021)   No facility-administered encounter medications on file as of 04/18/2021.    Surgical History: Past Surgical History:  Procedure Laterality Date  . APPENDECTOMY  1955  . BUNIONECTOMY  1995   and osteotomy  . COLONOSCOPY  2017  . CYSTOSCOPY  02/22/2012   Procedure: CYSTOSCOPY;  Surgeon: Claybon Jabs, MD;  Location: [facility];  Service: Urology;  Laterality: Right;  removal of ureteral stent  . FLEXIBLE SIGMOIDOSCOPY  08/12/2012   Procedure: FLEXIBLE SIGMOIDOSCOPY;  Surgeon: Inda Castle, MD;  Location: [facility];  Service: Endoscopy;  Laterality: N/A;  . NEPHROLITHOTOMY  01/28/2012-  right ureteral stent placement   Procedure: NEPHROLITHOTOMY PERCUTANEOUS;  Surgeon: Claybon Jabs, MD;  Location: [facility];  Service: Urology;  Laterality: Right;  . OTHER SURGICAL HISTORY  01/28/12   right percutaneous nephroliathothoma  .  REFRACTIVE SURGERY  yrs ago  . [place] and 2006   right  . URETEROSCOPY  02/22/2012   Procedure: URETEROSCOPY;  Surgeon: Claybon Jabs, MD;  Location: [facility];  Service: Urology;  Laterality: Right;  URETEROSCOPY WITH STONE EXTRACTION   . VAGINAL HYSTERECTOMY  1985    Medical History: Past Medical History:  Diagnosis Date  . Adenomatous colon polyp 07/03/2012  . Diverticulosis   . Frequency of urination   . History of kidney stones   .  Hyperlipidemia   . Hypertension   . Internal hemorrhoids   . Mitral valve prolapse per dr wall note 09/12--  stable  . PAC (premature atrial contraction) followed by cardiologist --  dr wall--  last visit  sept 12  note in epic--  states stable  . Palpitations hx pac's-   occasional  . Renal calculus, right followed by dr Karsten Ro   February 2013  . Right shoulder pain Dec 07, 2011 fall   pain at times with positioning  . Urgency of micturation   . Vertigo, intermittent     Family History: Family History  Problem Relation Age of Onset  . Liver cancer Mother   . Heart attack Sister   . Peripheral vascular disease Sister   . Colon cancer Neg Hx   . Breast cancer Neg Hx     Social History   Socioeconomic History  . Marital status: Married    Spouse name: Not on file  . Number of children: 3  . Years of education: Not on file  . Highest education level: Not on file  Occupational History  . Occupation: Housewife  Tobacco Use  . Smoking status: Former Smoker    Packs/day: 1.00    Years: 15.00    Pack years: 15.00    Quit date: 12/04/1975    Years since quitting: 45.4  . Smokeless tobacco: Never Used  Vaping Use  . Vaping Use: Never used  Substance and Sexual Activity  . Alcohol use: Yes    Alcohol/week: 7.0 standard drinks    Types: 7 Glasses of wine per week    Comment: 2-4 oz several times per week  . Drug use: No  . Sexual activity: Not on file  Other Topics Concern  . Not on file  Social History Narrative   Married.   Gets regular exercise.   Caffeine daily. 3 cups of coffee or soda per day   Social Determinants of Health   Financial Resource Strain: Not on file  Food Insecurity: Not on file  Transportation Needs: Not on file  Physical Activity: Not on file  Stress: Not on file  Social Connections: Not on file  Intimate Partner Violence: Not on file      Review of Systems  Constitutional: Negative for chills, diaphoresis and fatigue.  HENT: Negative  for ear pain, postnasal drip and sinus pressure.   Eyes: Negative for photophobia, discharge, redness, itching and visual disturbance.  Respiratory: Negative for cough, shortness of breath and wheezing.   Cardiovascular: Negative for chest pain, palpitations and leg swelling.  Gastrointestinal: Negative for abdominal pain, constipation, diarrhea, nausea and vomiting.  Genitourinary: Negative for dysuria and flank pain.  Musculoskeletal: Negative for arthralgias, back pain, gait problem and neck pain.  Skin: Negative for color change.  Allergic/Immunologic: Negative for environmental allergies and food allergies.  Neurological: Negative for dizziness and headaches.  Hematological: Does not bruise/bleed easily.  Psychiatric/Behavioral: Negative for agitation, behavioral problems (depression)  and hallucinations.    Vital Signs: BP 120/60 Comment: 176/100  Pulse 75   Temp (!) 97.3 F (36.3 C)   Resp 16   Ht 5\' 3"  (1.6 m)   Wt 117 lb 3.2 oz (53.2 kg)   SpO2 97%   BMI 20.76 kg/m    Physical Exam Constitutional:      General: She is not in acute distress.    Appearance: She is well-developed. She is not diaphoretic.  HENT:     Head: Normocephalic and atraumatic.     Mouth/Throat:     Pharynx: No oropharyngeal exudate.  Eyes:     Pupils: Pupils are equal, round, and reactive to light.  Neck:     Thyroid: No thyromegaly.     Vascular: No JVD.     Trachea: No tracheal deviation.  Cardiovascular:     Rate and Rhythm: Normal rate and regular rhythm.     Heart sounds: Normal heart sounds. No murmur heard. No friction rub. No gallop.   Pulmonary:     Effort: Pulmonary effort is normal. No respiratory distress.     Breath sounds: No wheezing or rales.  Chest:     Chest wall: No tenderness.  Abdominal:     General: Bowel sounds are normal.     Palpations: Abdomen is soft.  Musculoskeletal:        General: Normal range of motion.     Cervical back: Normal range of motion and  neck supple.  Lymphadenopathy:     Cervical: No cervical adenopathy.  Skin:    General: Skin is warm and dry.  Neurological:     Mental Status: She is alert and oriented to person, place, and time.     Cranial Nerves: No cranial nerve deficit.  Psychiatric:        Behavior: Behavior normal.        Thought Content: Thought content normal.        Judgment: Judgment normal.        Assessment/Plan: 1. Essential hypertension Blood pressure is very well controlled from home readings even though in the office her blood pressure was elevated however all home readings were within normal limits most of the numbers were below 106 systolic  2. Mixed hyperlipidemia Will continue on Lipitor patient has tolerated this well  3. Depression, major, single episode, mild ([place]) This is more situational seems to be resolved at this time patient is not on any medications  4. Neurogenic claudication (HCC) Her symptoms of heaviness and lower leg weakness while playing golf sounds like to me is neurogenic claudication due to probably worsening of spinal stenosis patient was instructed to speak with orthopedics.  Will prescribe meloxicam 15 mg once a day until seen by her orthopedics  General Counseling: Akaya verbalizes understanding of the findings of todays visit and agrees with plan of treatment. I have discussed any further diagnostic evaluation that may be needed or ordered today. We also reviewed her medications today. she has been encouraged to call the office with any questions or concerns that should arise related to todays visit.    No orders of the defined types were placed in this encounter.   Meds ordered this encounter  Medications  . meloxicam (MOBIC) 15 MG tablet    Sig: Take one tab po qd prn for arthritis    Dispense:  30 tablet    Refill:  3    Total time spent:30 Minutes Time spent includes review of chart, medications, test  results, and follow up plan with the patient.    [place] Controlled Substance Database was reviewed by me.   Dr Lavera Guise Internal medicine

## 2021-04-25 ENCOUNTER — Other Ambulatory Visit: Payer: Self-pay | Admitting: Internal Medicine

## 2021-04-25 DIAGNOSIS — I10 Essential (primary) hypertension: Secondary | ICD-10-CM

## 2021-04-27 DIAGNOSIS — M65331 Trigger finger, right middle finger: Secondary | ICD-10-CM | POA: Diagnosis not present

## 2021-04-27 DIAGNOSIS — Z4789 Encounter for other orthopedic aftercare: Secondary | ICD-10-CM | POA: Diagnosis not present

## 2021-04-27 DIAGNOSIS — M65341 Trigger finger, right ring finger: Secondary | ICD-10-CM | POA: Diagnosis not present

## 2021-05-11 DIAGNOSIS — M25641 Stiffness of right hand, not elsewhere classified: Secondary | ICD-10-CM | POA: Diagnosis not present

## 2021-06-07 DIAGNOSIS — Z4789 Encounter for other orthopedic aftercare: Secondary | ICD-10-CM | POA: Diagnosis not present

## 2021-06-07 DIAGNOSIS — M65331 Trigger finger, right middle finger: Secondary | ICD-10-CM | POA: Diagnosis not present

## 2021-06-07 DIAGNOSIS — M25512 Pain in left shoulder: Secondary | ICD-10-CM | POA: Diagnosis not present

## 2021-06-21 DIAGNOSIS — I10 Essential (primary) hypertension: Secondary | ICD-10-CM | POA: Diagnosis not present

## 2021-06-21 DIAGNOSIS — M810 Age-related osteoporosis without current pathological fracture: Secondary | ICD-10-CM | POA: Diagnosis not present

## 2021-06-21 DIAGNOSIS — M8588 Other specified disorders of bone density and structure, other site: Secondary | ICD-10-CM | POA: Diagnosis not present

## 2021-06-28 DIAGNOSIS — M8580 Other specified disorders of bone density and structure, unspecified site: Secondary | ICD-10-CM | POA: Diagnosis not present

## 2021-06-28 DIAGNOSIS — Z780 Asymptomatic menopausal state: Secondary | ICD-10-CM | POA: Diagnosis not present

## 2021-07-06 DIAGNOSIS — M25512 Pain in left shoulder: Secondary | ICD-10-CM | POA: Diagnosis not present

## 2021-07-12 ENCOUNTER — Other Ambulatory Visit: Payer: Self-pay

## 2021-07-12 MED ORDER — ATORVASTATIN CALCIUM 10 MG PO TABS: ORAL_TABLET | ORAL | 2 refills | Status: DC

## 2021-07-20 ENCOUNTER — Telehealth: Payer: Self-pay

## 2021-07-20 ENCOUNTER — Encounter (INDEPENDENT_AMBULATORY_CARE_PROVIDER_SITE_OTHER): Payer: Medicare Other | Admitting: Ophthalmology

## 2021-07-20 ENCOUNTER — Other Ambulatory Visit: Payer: Self-pay

## 2021-07-20 DIAGNOSIS — H35033 Hypertensive retinopathy, bilateral: Secondary | ICD-10-CM

## 2021-07-20 DIAGNOSIS — I10 Essential (primary) hypertension: Secondary | ICD-10-CM

## 2021-07-20 DIAGNOSIS — H43813 Vitreous degeneration, bilateral: Secondary | ICD-10-CM

## 2021-07-20 DIAGNOSIS — H353132 Nonexudative age-related macular degeneration, bilateral, intermediate dry stage: Secondary | ICD-10-CM

## 2021-07-20 NOTE — Telephone Encounter (Signed)
Left vm for patient to return my call to see if she is okay seeing Alysssa on 10/25/21 or if she prefers to see [id number]-[id number]

## 2021-08-16 DIAGNOSIS — Z4789 Encounter for other orthopedic aftercare: Secondary | ICD-10-CM | POA: Diagnosis not present

## 2021-08-16 DIAGNOSIS — M65849 Other synovitis and tenosynovitis, unspecified hand: Secondary | ICD-10-CM | POA: Diagnosis not present

## 2021-08-16 DIAGNOSIS — M25512 Pain in left shoulder: Secondary | ICD-10-CM | POA: Diagnosis not present

## 2021-08-17 ENCOUNTER — Encounter (INDEPENDENT_AMBULATORY_CARE_PROVIDER_SITE_OTHER): Payer: Medicare Other | Admitting: Ophthalmology

## 2021-08-17 ENCOUNTER — Other Ambulatory Visit: Payer: Self-pay

## 2021-08-17 DIAGNOSIS — H43813 Vitreous degeneration, bilateral: Secondary | ICD-10-CM

## 2021-08-17 DIAGNOSIS — H353132 Nonexudative age-related macular degeneration, bilateral, intermediate dry stage: Secondary | ICD-10-CM | POA: Diagnosis not present

## 2021-08-17 DIAGNOSIS — H35033 Hypertensive retinopathy, bilateral: Secondary | ICD-10-CM | POA: Diagnosis not present

## 2021-08-17 DIAGNOSIS — I10 Essential (primary) hypertension: Secondary | ICD-10-CM

## 2021-08-18 DIAGNOSIS — M25512 Pain in left shoulder: Secondary | ICD-10-CM | POA: Diagnosis not present

## 2021-08-25 DIAGNOSIS — M25512 Pain in left shoulder: Secondary | ICD-10-CM | POA: Diagnosis not present

## 2021-09-01 DIAGNOSIS — M25512 Pain in left shoulder: Secondary | ICD-10-CM | POA: Diagnosis not present

## 2021-09-04 NOTE — Progress Notes (Signed)
Cardiology Office Note  Date:  09/05/2021   ID:  Amy Gilmore, DOB 1936-06-21, MRN [id number]  PCP:  Lavera Guise, MD   Chief Complaint  Patient presents with   Follow-up    Patient doing well. No complaints.     HPI:  Mrs Amy Gilmore is a 85 year old woman with notes indicating a history of mitral valve prolapse,  palpitations and PACs,  hypertension  hyperlipidemia,  echocardiogram in 2011 did not mention mitral valve prolapse but did suggest mild, possible mild to moderate MR, normal ejection fraction. Prior smoking history, stopped 30 years ago She presents today for routine follow-up of her mitral valve regurgitation and APCs  Episodes of vertigo, dizziness Head feels fuzzy Memory issues, Husband who presents with her today thinks he may need evaluation with neurocognitive services, thinking about going to [facility] on treadmill, daily, 1 mile Does not like walking around the neighborhood, feels unsteady  BP at home 140/85-90  No orthostasis symptoms, just fuzzy head Denies chest pain shortness of breath No tachycardia or palpitations  Lab work reviewed Total chol 148, LDL 65  EKG personally reviewed by myself on todays visit Shows normal sinus rhythm rate 75 bpm no significant ST or T wave changes    PMH:   has a past medical history of Adenomatous colon polyp (07/03/2012), Diverticulosis, Frequency of urination, History of kidney stones, Hyperlipidemia, Hypertension, Internal hemorrhoids, Mitral valve prolapse (per dr wall note 09/12--  stable), PAC (premature atrial contraction) (followed by cardiologist --  dr wall--  last visit  sept 12  note in epic--  states stable), Palpitations (hx pac's-   occasional), Renal calculus, right (followed by dr Karsten Ro), Right shoulder pain (Dec 07, 2011 fall), Urgency of micturation, and Vertigo, intermittent.  PSH:    Past Surgical History:  Procedure Laterality Date   [place]    and osteotomy   COLONOSCOPY  2017   CYSTOSCOPY  02/22/2012   Procedure: CYSTOSCOPY;  Surgeon: Claybon Jabs, MD;  Location: [facility];  Service: Urology;  Laterality: Right;  removal of ureteral stent   FLEXIBLE SIGMOIDOSCOPY  08/12/2012   Procedure: FLEXIBLE SIGMOIDOSCOPY;  Surgeon: Inda Castle, MD;  Location: [facility];  Service: Endoscopy;  Laterality: N/A;   NEPHROLITHOTOMY  01/28/2012-  right ureteral stent placement   Procedure: NEPHROLITHOTOMY PERCUTANEOUS;  Surgeon: Claybon Jabs, MD;  Location: [facility];  Service: Urology;  Laterality: Right;   OTHER SURGICAL HISTORY  01/28/12   right percutaneous nephroliathothoma   REFRACTIVE SURGERY  yrs ago   [place] and 2006   right   URETEROSCOPY  02/22/2012   Procedure: URETEROSCOPY;  Surgeon: Claybon Jabs, MD;  Location: [facility];  Service: Urology;  Laterality: Right;  URETEROSCOPY WITH STONE EXTRACTION    VAGINAL HYSTERECTOMY  1985    Current Outpatient Medications  Medication Sig Dispense Refill   acetaminophen (TYLENOL) 500 MG tablet Take 1,000 mg by mouth every 6 (six) hours as needed. Pain      aspirin 81 MG tablet Take 81 mg by mouth daily.     atorvastatin (LIPITOR) 10 MG tablet TAKE 1 TABLET(10 MG) BY MOUTH AT BEDTIME 90 tablet 2   Hypromellose 0.3 % SOLN Place 1-2 drops into both eyes 2 (two) times daily as needed. Dry eyes      meloxicam (MOBIC) 15 MG tablet Take one tab po qd prn for arthritis 30 tablet 3  Multiple Vitamins-Minerals (CENTRUM SILVER) tablet Take 1 tablet by mouth daily.     RESTASIS 0.05 % ophthalmic emulsion INSTILL 1 DROP INTO BOTH EYES TWICE A DAY  4   valsartan-hydrochlorothiazide (DIOVAN-HCT) 160-12.5 MG tablet Take 1 tablet by mouth daily.     No current facility-administered medications for this visit.     Allergies:   Codeine and Sulfa antibiotics   Social History:  The patient  reports that she quit smoking about 45 years ago. Her  smoking use included cigarettes. She has a 15.00 pack-year smoking history. She has never used smokeless tobacco. She reports current alcohol use of about 7.0 standard drinks per week. She reports that she does not use drugs.   Family History:   family history includes Heart attack in her sister; Liver cancer in her mother; Peripheral vascular disease in her sister.    Review of Systems: Review of Systems  Constitutional: Negative.   HENT: Negative.    Respiratory: Negative.    Cardiovascular: Negative.   Gastrointestinal: Negative.        Indigestion  Musculoskeletal: Negative.   Neurological: Negative.   Psychiatric/Behavioral: Negative.    All other systems reviewed and are negative.   PHYSICAL EXAM: VS:  BP 140/82   Pulse 75   Ht 5\' 2"  (1.575 m)   Wt 113 lb 6.4 oz (51.4 kg)   SpO2 98%   BMI 20.74 kg/m  , BMI Body mass index is 20.74 kg/m. Constitutional:  oriented to person, place, and time. No distress.  HENT:  Head: Grossly normal Eyes:  no discharge. No scleral icterus.  Neck: No JVD, no carotid bruits  Cardiovascular: Regular rate and rhythm, no murmurs appreciated Pulmonary/Chest: Clear to auscultation bilaterally, no wheezes or rails Abdominal: Soft.  no distension.  no tenderness.  Musculoskeletal: Normal range of motion Neurological:  normal muscle tone. Coordination normal. No atrophy Skin: Skin warm and dry Psychiatric: normal affect, pleasant   Recent Labs: 11/10/2020: ALT 21; BUN 13; Creatinine, Ser 0.69; Hemoglobin 13.9; Platelets 344; Potassium 3.7; Sodium 142; TSH 3.760    Lipid Panel Lab Results  Component Value Date   CHOL 181 11/10/2020   HDL 73 11/10/2020   LDLCALC 94 11/10/2020   TRIG 77 11/10/2020      Wt Readings from Last 3 Encounters:  09/05/21 113 lb 6.4 oz (51.4 kg)  04/18/21 117 lb 3.2 oz (53.2 kg)  02/23/21 118 lb (53.5 kg)     ASSESSMENT AND PLAN:  Essential hypertension - Concerned about high blood pressure, they are  requesting additional medication changes Currently taking valsartan HCTZ 320/12.5 mg in the morning Recommend he add amlodipine 2.5 mg in the p.m., call us with some blood pressure numbers  Mixed hyperlipidemia Cholesterol is at goal on the current lipid regimen. No changes to the medications were made.  Mitral valve insufficiency, unspecified etiology No significant murmur appreciated on exam No further work-up needed  Palpitations She could take propranolol as needed Not a major issue  Short-term memory loss Discussed various treatment options, recommended they seek further evaluation with neurology   Total encounter time more than 25 minutes  Greater than 50% was spent in counseling and coordination of care with the patient    No orders of the defined types were placed in this encounter.    Signed, Esmond Plants, M.D., Ph.D. 09/05/2021  [facility] [place], [place]

## 2021-09-05 ENCOUNTER — Other Ambulatory Visit: Payer: Self-pay

## 2021-09-05 ENCOUNTER — Ambulatory Visit (INDEPENDENT_AMBULATORY_CARE_PROVIDER_SITE_OTHER): Payer: Medicare Other | Admitting: Cardiovascular Disease

## 2021-09-05 ENCOUNTER — Encounter: Payer: Self-pay | Admitting: Cardiovascular Disease

## 2021-09-05 VITALS — BP 140/82 | HR 75 | Ht 62.0 in | Wt 113.4 lb

## 2021-09-05 DIAGNOSIS — I340 Nonrheumatic mitral (valve) insufficiency: Secondary | ICD-10-CM | POA: Diagnosis not present

## 2021-09-05 DIAGNOSIS — R002 Palpitations: Secondary | ICD-10-CM | POA: Diagnosis not present

## 2021-09-05 DIAGNOSIS — E782 Mixed hyperlipidemia: Secondary | ICD-10-CM

## 2021-09-05 DIAGNOSIS — I10 Essential (primary) hypertension: Secondary | ICD-10-CM | POA: Diagnosis not present

## 2021-09-05 MED ORDER — AMLODIPINE BESYLATE 2.5 MG PO TABS: 2.5000 mg | ORAL_TABLET | Freq: Every day | ORAL | 3 refills | Status: DC

## 2021-09-05 NOTE — Patient Instructions (Addendum)
Medication Instructions:   Please START amlodipine 2.5 daily Can take at dinner or before bed  If you need a refill on your cardiac medications before your next appointment, please call your pharmacy.   Lab work: No new labs needed  Testing/Procedures: No new testing needed  Follow-Up: At [facility], you and your health needs are our priority.  As part of our continuing mission to provide you with exceptional heart care, we have created designated Provider Care Teams.  These Care Teams include your primary Cardiologist (physician) and Advanced Practice Providers (APPs -  Physician Assistants and Nurse Practitioners) who all work together to provide you with the care you need, when you need it.  You will need a follow up appointment in 12 months  Providers on your designated Care Team:   Murray Hodgkins, NP Christell Faith, PA-C Marrianne Mood, PA-C [place], [place]  COVID-19 Vaccine Information can be found at: [email or web address].uk For questions related to vaccine distribution or appointments, please email vaccine@[email or web address] .com or call [phone].

## 2021-09-08 DIAGNOSIS — M25512 Pain in left shoulder: Secondary | ICD-10-CM | POA: Diagnosis not present

## 2021-09-09 DIAGNOSIS — Z23 Encounter for immunization: Secondary | ICD-10-CM | POA: Diagnosis not present

## 2021-09-12 ENCOUNTER — Telehealth: Payer: Self-pay

## 2021-09-12 ENCOUNTER — Other Ambulatory Visit: Payer: Self-pay | Admitting: Internal Medicine

## 2021-09-12 DIAGNOSIS — D519 Vitamin B12 deficiency anemia, unspecified: Secondary | ICD-10-CM

## 2021-09-12 NOTE — Telephone Encounter (Signed)
Done

## 2021-09-13 ENCOUNTER — Telehealth: Payer: Self-pay

## 2021-09-13 DIAGNOSIS — D519 Vitamin B12 deficiency anemia, unspecified: Secondary | ICD-10-CM | POA: Diagnosis not present

## 2021-09-13 NOTE — Telephone Encounter (Signed)
Called and informed pt that [facility] order her B12 and folate panel and she can go to [facility] to have done

## 2021-09-14 LAB — B12 AND FOLATE PANEL
Folate: 19.6 ng/mL (ref >3.0)
Vitamin B-12: >2000 pg/mL — ABNORMAL HIGH (ref 232–1245)

## 2021-09-15 DIAGNOSIS — M25512 Pain in left shoulder: Secondary | ICD-10-CM | POA: Diagnosis not present

## 2021-09-18 ENCOUNTER — Encounter (INDEPENDENT_AMBULATORY_CARE_PROVIDER_SITE_OTHER): Payer: Medicare Other | Admitting: Ophthalmology

## 2021-09-18 ENCOUNTER — Other Ambulatory Visit: Payer: Self-pay

## 2021-09-18 DIAGNOSIS — H353112 Nonexudative age-related macular degeneration, right eye, intermediate dry stage: Secondary | ICD-10-CM

## 2021-09-18 DIAGNOSIS — H43813 Vitreous degeneration, bilateral: Secondary | ICD-10-CM

## 2021-09-18 DIAGNOSIS — H35033 Hypertensive retinopathy, bilateral: Secondary | ICD-10-CM | POA: Diagnosis not present

## 2021-09-18 DIAGNOSIS — I10 Essential (primary) hypertension: Secondary | ICD-10-CM

## 2021-09-18 DIAGNOSIS — H353221 Exudative age-related macular degeneration, left eye, with active choroidal neovascularization: Secondary | ICD-10-CM

## 2021-10-06 ENCOUNTER — Telehealth: Payer: Self-pay

## 2021-10-06 NOTE — Telephone Encounter (Signed)
Left vm to confirm 10/10/21 appointment-Toni 

## 2021-10-10 ENCOUNTER — Ambulatory Visit (INDEPENDENT_AMBULATORY_CARE_PROVIDER_SITE_OTHER): Payer: Medicare Other | Admitting: Internal Medicine

## 2021-10-10 ENCOUNTER — Encounter: Payer: Self-pay | Admitting: Internal Medicine

## 2021-10-10 ENCOUNTER — Other Ambulatory Visit: Payer: Self-pay

## 2021-10-10 VITALS — BP 140/98 | HR 71 | Temp 98.0°F | Resp 16 | Ht 62.0 in | Wt 112.0 lb

## 2021-10-10 DIAGNOSIS — Z0001 Encounter for general adult medical examination with abnormal findings: Secondary | ICD-10-CM | POA: Diagnosis not present

## 2021-10-10 DIAGNOSIS — M159 Polyosteoarthritis, unspecified: Secondary | ICD-10-CM | POA: Diagnosis not present

## 2021-10-10 DIAGNOSIS — I10 Essential (primary) hypertension: Secondary | ICD-10-CM

## 2021-10-10 DIAGNOSIS — E782 Mixed hyperlipidemia: Secondary | ICD-10-CM | POA: Diagnosis not present

## 2021-10-10 DIAGNOSIS — Z1231 Encounter for screening mammogram for malignant neoplasm of breast: Secondary | ICD-10-CM | POA: Diagnosis not present

## 2021-10-10 MED ORDER — AMLODIPINE BESYLATE 5 MG PO TABS: 5.0000 mg | ORAL_TABLET | Freq: Every day | ORAL | 3 refills | Status: DC

## 2021-10-10 MED ORDER — VALSARTAN-HYDROCHLOROTHIAZIDE 320-12.5 MG PO TABS: 1.0000 | ORAL_TABLET | Freq: Every day | ORAL | 3 refills | Status: DC

## 2021-10-10 NOTE — Progress Notes (Signed)
[facility] [place], [place] [address]  Internal MEDICINE  Office Visit Note  Patient Name: Amy Gilmore  [id number]  [id number]  Date of Service: 11/05/2021  Chief Complaint  Patient presents with   Medicare Wellness   Hyperlipidemia   Hypertension     HPI Pt is here for routine health maintenance examination Patient feels well denies any major complaints, blood pressure continues to have fluctuation has been seen by cardiology and Norvasc has been added.. Her arthritis symptoms does bother her while playing golf however she is able to manage seen by orthopedic in the past. She is up-to-date on most of her preventive health maintenance.  Current Medication: Outpatient Encounter Medications as of 10/10/2021  Medication Sig   acetaminophen (TYLENOL) 500 MG tablet Take 1,000 mg by mouth every 6 (six) hours as needed. Pain    amLODipine (NORVASC) 5 MG tablet Take 1 tablet (5 mg total) by mouth daily.   aspirin 81 MG tablet Take 81 mg by mouth daily.   atorvastatin (LIPITOR) 10 MG tablet TAKE 1 TABLET(10 MG) BY MOUTH AT BEDTIME   Hypromellose 0.3 % SOLN Place 1-2 drops into both eyes 2 (two) times daily as needed. Dry eyes    meloxicam (MOBIC) 15 MG tablet Take one tab po qd prn for arthritis   Multiple Vitamins-Minerals (CENTRUM SILVER) tablet Take 1 tablet by mouth daily.   RESTASIS 0.05 % ophthalmic emulsion INSTILL 1 DROP INTO BOTH EYES TWICE A DAY   trimethoprim-polymyxin b (POLYTRIM) ophthalmic solution SMARTSIG:In Eye(s)   valsartan-hydrochlorothiazide (DIOVAN-HCT) 320-12.5 MG tablet Take 1 tablet by mouth daily.   [DISCONTINUED] amLODipine (NORVASC) 2.5 MG tablet Take 1 tablet (2.5 mg total) by mouth daily.   [DISCONTINUED] valsartan-hydrochlorothiazide (DIOVAN-HCT) 160-12.5 MG tablet Take 1 tablet by mouth daily.   No facility-administered encounter medications on file as of 10/10/2021.    Surgical History: Past Surgical History:   Procedure Laterality Date   [place]   and osteotomy   COLONOSCOPY  2017   CYSTOSCOPY  02/22/2012   Procedure: CYSTOSCOPY;  Surgeon: Claybon Jabs, MD;  Location: [facility];  Service: Urology;  Laterality: Right;  removal of ureteral stent   FLEXIBLE SIGMOIDOSCOPY  08/12/2012   Procedure: FLEXIBLE SIGMOIDOSCOPY;  Surgeon: Inda Castle, MD;  Location: [facility];  Service: Endoscopy;  Laterality: N/A;   NEPHROLITHOTOMY  01/28/2012-  right ureteral stent placement   Procedure: NEPHROLITHOTOMY PERCUTANEOUS;  Surgeon: Claybon Jabs, MD;  Location: [facility];  Service: Urology;  Laterality: Right;   OTHER SURGICAL HISTORY  01/28/12   right percutaneous nephroliathothoma   REFRACTIVE SURGERY  yrs ago   [place] and 2006   right   URETEROSCOPY  02/22/2012   Procedure: URETEROSCOPY;  Surgeon: Claybon Jabs, MD;  Location: [facility];  Service: Urology;  Laterality: Right;  URETEROSCOPY WITH STONE EXTRACTION    VAGINAL HYSTERECTOMY  1985    Medical History: Past Medical History:  Diagnosis Date   Adenomatous colon polyp 07/03/2012   Diverticulosis    Frequency of urination    History of kidney stones    Hyperlipidemia    Hypertension    Internal hemorrhoids    Mitral valve prolapse per dr wall note 09/12--  stable   PAC (premature atrial contraction) followed by cardiologist --  dr wall--  last visit  sept 12  note in epic--  states stable   Palpitations  hx pac's-   occasional   Renal calculus, right followed by dr Karsten Ro   February 2013   Right shoulder pain Dec 07, 2011 fall   pain at times with positioning   Urgency of micturation    Vertigo, intermittent     Family History: Family History  Problem Relation Age of Onset   Liver cancer Mother    Heart attack Sister    Peripheral vascular disease Sister    Colon cancer Neg Hx    Breast cancer Neg Hx     Social History: Social History    Socioeconomic History   Marital status: Married    Spouse name: Not on file   Number of children: 3   Years of education: Not on file   Highest education level: Not on file  Occupational History   Occupation: Housewife  Tobacco Use   Smoking status: Former    Packs/day: 1.00    Years: 15.00    Pack years: 15.00    Types: Cigarettes    Quit date: 12/04/1975    Years since quitting: 45.9   Smokeless tobacco: Never  Vaping Use   Vaping Use: Never used  Substance and Sexual Activity   Alcohol use: Yes    Alcohol/week: 7.0 standard drinks    Types: 7 Glasses of wine per week    Comment: 2-4 oz several times per week   Drug use: No   Sexual activity: Not on file  Other Topics Concern   Not on file  Social History Narrative   Married.   Gets regular exercise.   Caffeine daily. 3 cups of coffee or soda per day   Social Determinants of Health   Financial Resource Strain: Not on file  Food Insecurity: Not on file  Transportation Needs: Not on file  Physical Activity: Not on file  Stress: Not on file  Social Connections: Not on file      Review of Systems  Constitutional:  Negative for chills, fatigue and unexpected weight change.  HENT:  Negative for congestion, postnasal drip, rhinorrhea, sneezing and sore throat.   Eyes:  Negative for redness.  Respiratory:  Negative for cough, chest tightness and shortness of breath.   Cardiovascular:  Negative for chest pain and palpitations.  Gastrointestinal:  Negative for abdominal pain, constipation, diarrhea, nausea and vomiting.  Genitourinary:  Negative for dysuria and frequency.  Musculoskeletal:  Positive for arthralgias and neck pain. Negative for back pain and joint swelling.  Skin:  Negative for rash.  Neurological: Negative.  Negative for tremors and numbness.  Hematological:  Negative for adenopathy. Does not bruise/bleed easily.  Psychiatric/Behavioral:  Negative for behavioral problems (Depression), sleep  disturbance and suicidal ideas. The patient is not nervous/anxious.     Vital Signs: BP (!) 140/98   Pulse 71   Temp 98 F (36.7 C)   Resp 16   Ht 5\' 2"  (1.575 m)   Wt 112 lb (50.8 kg)   SpO2 96%   BMI 20.49 kg/m    Physical Exam Constitutional:      General: She is not in acute distress.    Appearance: She is well-developed. She is not diaphoretic.  HENT:     Head: Normocephalic and atraumatic.     Mouth/Throat:     Pharynx: No oropharyngeal exudate.  Eyes:     Pupils: Pupils are equal, round, and reactive to light.  Neck:     Thyroid: No thyromegaly.     Vascular: No JVD.  Trachea: No tracheal deviation.  Cardiovascular:     Rate and Rhythm: Normal rate and regular rhythm.     Heart sounds: Normal heart sounds. No murmur heard.   No friction rub. No gallop.  Pulmonary:     Effort: Pulmonary effort is normal. No respiratory distress.     Breath sounds: No wheezing or rales.  Chest:     Chest wall: No tenderness.  Abdominal:     General: Bowel sounds are normal.     Palpations: Abdomen is soft.  Musculoskeletal:        General: Normal range of motion.     Cervical back: Normal range of motion and neck supple.  Lymphadenopathy:     Cervical: No cervical adenopathy.  Skin:    General: Skin is warm and dry.  Neurological:     Mental Status: She is alert and oriented to person, place, and time.     Cranial Nerves: No cranial nerve deficit.  Psychiatric:        Behavior: Behavior normal.        Thought Content: Thought content normal.        Judgment: Judgment normal.     LABS: Recent Results (from the past 2160 hour(s))  B12 and Folate Panel     Status: Abnormal   Collection Time: 09/13/21  1:25 PM  Result Value Ref Range   Vitamin B-12 >2000 (H) 232 - 1245 pg/mL   Folate 19.6 >3.0 ng/mL    Comment: A serum folate concentration of less than 3.1 ng/mL is considered to represent clinical deficiency.         Assessment/Plan: 1. Encounter for  general adult medical examination with abnormal findings Patient is 85 year old appearing female she is up-to-date on her preventive health maintenance followed by multiple specialist including cardiology  2. Essential hypertension Blood pressure is slightly elevated today we will increase her Norvasc 5 mg once a day continue Diovan HCTZ as before  3. Mixed hyperlipidemia Patient is on Lipitor we will continue  4. Primary osteoarthritis involving multiple joints Take Tylenol as needed  5. Visit for screening mammogram Mammogram is  General Counseling: Dicy verbalizes understanding of the findings of todays visit and agrees with plan of treatment. I have discussed any further diagnostic evaluation that may be needed or ordered today. We also reviewed her medications today. she has been encouraged to call the office with any questions or concerns that should arise related to todays visit.    Counseling:  [place] Controlled Substance Database was reviewed by me.  No orders of the defined types were placed in this encounter.   Meds ordered this encounter  Medications   valsartan-hydrochlorothiazide (DIOVAN-HCT) 320-12.5 MG tablet    Sig: Take 1 tablet by mouth daily.    Dispense:  90 tablet    Refill:  3   amLODipine (NORVASC) 5 MG tablet    Sig: Take 1 tablet (5 mg total) by mouth daily.    Dispense:  90 tablet    Refill:  3     Total time spent:35 Minutes  Time spent includes review of chart, medications, test results, and follow up plan with the patient.     Lavera Guise, MD  Internal Medicine

## 2021-10-16 ENCOUNTER — Encounter (INDEPENDENT_AMBULATORY_CARE_PROVIDER_SITE_OTHER): Payer: Medicare Other | Admitting: Ophthalmology

## 2021-10-16 ENCOUNTER — Other Ambulatory Visit: Payer: Self-pay

## 2021-10-16 DIAGNOSIS — H35033 Hypertensive retinopathy, bilateral: Secondary | ICD-10-CM

## 2021-10-16 DIAGNOSIS — I10 Essential (primary) hypertension: Secondary | ICD-10-CM | POA: Diagnosis not present

## 2021-10-16 DIAGNOSIS — H43813 Vitreous degeneration, bilateral: Secondary | ICD-10-CM

## 2021-10-16 DIAGNOSIS — H353111 Nonexudative age-related macular degeneration, right eye, early dry stage: Secondary | ICD-10-CM

## 2021-10-16 DIAGNOSIS — H353221 Exudative age-related macular degeneration, left eye, with active choroidal neovascularization: Secondary | ICD-10-CM

## 2021-10-17 ENCOUNTER — Other Ambulatory Visit: Payer: Self-pay | Admitting: Internal Medicine

## 2021-10-17 DIAGNOSIS — Z1231 Encounter for screening mammogram for malignant neoplasm of breast: Secondary | ICD-10-CM

## 2021-10-24 ENCOUNTER — Ambulatory Visit: Payer: Medicare Other | Admitting: Internal Medicine

## 2021-10-25 ENCOUNTER — Ambulatory Visit: Payer: Medicare Other | Admitting: Nurse Practitioner

## 2021-10-31 ENCOUNTER — Ambulatory Visit: Payer: Medicare Other | Admitting: Internal Medicine

## 2021-11-01 DIAGNOSIS — M25571 Pain in right ankle and joints of right foot: Secondary | ICD-10-CM | POA: Diagnosis not present

## 2021-11-15 ENCOUNTER — Other Ambulatory Visit: Payer: Self-pay

## 2021-11-15 ENCOUNTER — Encounter (INDEPENDENT_AMBULATORY_CARE_PROVIDER_SITE_OTHER): Payer: Medicare Other | Admitting: Ophthalmology

## 2021-11-15 DIAGNOSIS — H353112 Nonexudative age-related macular degeneration, right eye, intermediate dry stage: Secondary | ICD-10-CM

## 2021-11-15 DIAGNOSIS — H35033 Hypertensive retinopathy, bilateral: Secondary | ICD-10-CM | POA: Diagnosis not present

## 2021-11-15 DIAGNOSIS — H43813 Vitreous degeneration, bilateral: Secondary | ICD-10-CM | POA: Diagnosis not present

## 2021-11-15 DIAGNOSIS — I10 Essential (primary) hypertension: Secondary | ICD-10-CM | POA: Diagnosis not present

## 2021-11-15 DIAGNOSIS — H353221 Exudative age-related macular degeneration, left eye, with active choroidal neovascularization: Secondary | ICD-10-CM

## 2021-11-29 ENCOUNTER — Encounter: Payer: Self-pay | Admitting: Gastroenterology

## 2021-12-06 DIAGNOSIS — M25571 Pain in right ankle and joints of right foot: Secondary | ICD-10-CM | POA: Diagnosis not present

## 2021-12-12 ENCOUNTER — Telehealth: Payer: Self-pay

## 2021-12-12 DIAGNOSIS — M76821 Posterior tibial tendinitis, right leg: Secondary | ICD-10-CM | POA: Diagnosis not present

## 2021-12-12 DIAGNOSIS — M2011 Hallux valgus (acquired), right foot: Secondary | ICD-10-CM | POA: Diagnosis not present

## 2021-12-12 DIAGNOSIS — M2012 Hallux valgus (acquired), left foot: Secondary | ICD-10-CM | POA: Diagnosis not present

## 2021-12-12 NOTE — Telephone Encounter (Signed)
Pt was last seen 10-10-21, she has noticed faint brownish looking flakes in urine and it has happened a couple times over the last 4 to 5 days. She has not gone to urgent care but was concerned. No pelvic pain or pressure, no odor, discharge, no lower back pain. Appt for tomorrow was made.

## 2021-12-13 ENCOUNTER — Encounter: Payer: Self-pay | Admitting: Nurse Practitioner

## 2021-12-13 ENCOUNTER — Other Ambulatory Visit: Payer: Self-pay

## 2021-12-13 ENCOUNTER — Ambulatory Visit (INDEPENDENT_AMBULATORY_CARE_PROVIDER_SITE_OTHER): Payer: Medicare Other | Admitting: Nurse Practitioner

## 2021-12-13 ENCOUNTER — Encounter (INDEPENDENT_AMBULATORY_CARE_PROVIDER_SITE_OTHER): Payer: Medicare Other | Admitting: Ophthalmology

## 2021-12-13 VITALS — BP 126/78 | HR 75 | Temp 98.5°F | Resp 16 | Ht 62.5 in | Wt 113.6 lb

## 2021-12-13 DIAGNOSIS — H353221 Exudative age-related macular degeneration, left eye, with active choroidal neovascularization: Secondary | ICD-10-CM

## 2021-12-13 DIAGNOSIS — H35033 Hypertensive retinopathy, bilateral: Secondary | ICD-10-CM

## 2021-12-13 DIAGNOSIS — H353112 Nonexudative age-related macular degeneration, right eye, intermediate dry stage: Secondary | ICD-10-CM | POA: Diagnosis not present

## 2021-12-13 DIAGNOSIS — I10 Essential (primary) hypertension: Secondary | ICD-10-CM | POA: Diagnosis not present

## 2021-12-13 DIAGNOSIS — H43813 Vitreous degeneration, bilateral: Secondary | ICD-10-CM

## 2021-12-13 DIAGNOSIS — R8290 Unspecified abnormal findings in urine: Secondary | ICD-10-CM

## 2021-12-13 DIAGNOSIS — N3000 Acute cystitis without hematuria: Secondary | ICD-10-CM

## 2021-12-13 MED ORDER — NITROFURANTOIN MONOHYD MACRO 100 MG PO CAPS: 100.0000 mg | ORAL_CAPSULE | Freq: Two times a day (BID) | ORAL | 0 refills | Status: AC

## 2021-12-13 NOTE — Progress Notes (Signed)
[facility] [place], [place] [address]  Internal MEDICINE  Office Visit Note  Patient Name: Amy Gilmore  [id number]  [id number]  Date of Service: 12/13/2021  Chief Complaint  Patient presents with   Acute Visit   Urinary Tract Infection    Pt noticed faint brownish looking flakes in urine and it has happened 4 times over the last 5 to 6 days. No pelvic pain or pressure, no odor, no discharge, no itching, no lower back pain.      HPI Harneet presents for an acute sick visit for possible UTI. She noticed brownish looking flakes in her urine and it has happened 4 times over the last 6 days. She denies any pelvic pain, pelvic pressure, odor, discharge, vaginal itching.      Current Medication:  Outpatient Encounter Medications as of 12/13/2021  Medication Sig   acetaminophen (TYLENOL) 500 MG tablet Take 1,000 mg by mouth every 6 (six) hours as needed. Pain    amLODipine (NORVASC) 5 MG tablet Take 1 tablet (5 mg total) by mouth daily.   aspirin 81 MG tablet Take 81 mg by mouth daily.   atorvastatin (LIPITOR) 10 MG tablet TAKE 1 TABLET(10 MG) BY MOUTH AT BEDTIME   Hypromellose 0.3 % SOLN Place 1-2 drops into both eyes 2 (two) times daily as needed. Dry eyes    meloxicam (MOBIC) 15 MG tablet Take one tab po qd prn for arthritis   Multiple Vitamins-Minerals (CENTRUM SILVER) tablet Take 1 tablet by mouth daily.   [EXPIRED] nitrofurantoin, macrocrystal-monohydrate, (MACROBID) 100 MG capsule Take 1 capsule (100 mg total) by mouth 2 (two) times daily for 7 days.   RESTASIS 0.05 % ophthalmic emulsion INSTILL 1 DROP INTO BOTH EYES TWICE A DAY   trimethoprim-polymyxin b (POLYTRIM) ophthalmic solution SMARTSIG:In Eye(s)   valsartan-hydrochlorothiazide (DIOVAN-HCT) 320-12.5 MG tablet Take 1 tablet by mouth daily.   No facility-administered encounter medications on file as of 12/13/2021.      Medical History: Past Medical History:  Diagnosis Date    Adenomatous colon polyp 07/03/2012   Diverticulosis    Frequency of urination    History of kidney stones    Hyperlipidemia    Hypertension    Internal hemorrhoids    Mitral valve prolapse per dr wall note 09/12--  stable   PAC (premature atrial contraction) followed by cardiologist --  dr wall--  last visit  sept 12  note in epic--  states stable   Palpitations hx pac's-   occasional   Renal calculus, right followed by dr Karsten Ro   February 2013   Right shoulder pain Dec 07, 2011 fall   pain at times with positioning   Urgency of micturation    Vertigo, intermittent      Vital Signs: BP 126/78    Pulse 75    Temp 98.5 F (36.9 C)    Resp 16    Ht 5' 2.5" (1.588 m)    Wt 113 lb 9.6 oz (51.5 kg)    SpO2 98%    BMI 20.45 kg/m    Review of Systems  Constitutional:  Negative for chills, fatigue and unexpected weight change.  HENT:  Negative for congestion, rhinorrhea, sneezing and sore throat.   Eyes:  Negative for redness.  Respiratory:  Negative for cough, chest tightness and shortness of breath.   Cardiovascular:  Negative for chest pain and palpitations.  Gastrointestinal:  Negative for abdominal pain, constipation, diarrhea, nausea and vomiting.  Genitourinary:  Positive for  frequency and urgency. Negative for dysuria.  Musculoskeletal: Negative.  Negative for arthralgias, back pain, joint swelling and neck pain.  Skin:  Negative for rash.  Neurological: Negative.  Negative for tremors and numbness.  Hematological:  Negative for adenopathy. Does not bruise/bleed easily.  Psychiatric/Behavioral:  Negative for behavioral problems (Depression), sleep disturbance and suicidal ideas. The patient is not nervous/anxious.    Physical Exam Vitals reviewed.  Constitutional:      General: She is not in acute distress.    Appearance: Normal appearance. She is normal weight. She is not ill-appearing.  HENT:     Head: Normocephalic and atraumatic.  Eyes:     Pupils: Pupils are equal,  round, and reactive to light.  Cardiovascular:     Rate and Rhythm: Normal rate and regular rhythm.  Neurological:     Mental Status: She is alert and oriented to person, place, and time.     Cranial Nerves: No cranial nerve deficit.     Coordination: Coordination normal.     Gait: Gait normal.  Psychiatric:        Mood and Affect: Mood normal.        Behavior: Behavior normal.      Assessment/Plan: 1. Acute cystitis without hematuria Empiric antibiotic treatment prescribed.  - nitrofurantoin, macrocrystal-monohydrate, (MACROBID) 100 MG capsule; Take 1 capsule (100 mg total) by mouth 2 (two) times daily for 7 days.  Dispense: 14 capsule; Refill: 0  2. Abnormal urine sediment Unable to provide urine specimen in office, order sent to [facility] - UA/M w/rflx Culture, Routine   General Counseling: Daveah verbalizes understanding of the findings of todays visit and agrees with plan of treatment. I have discussed any further diagnostic evaluation that may be needed or ordered today. We also reviewed her medications today. she has been encouraged to call the office with any questions or concerns that should arise related to todays visit.    Counseling:    Orders Placed This Encounter  Procedures   UA/M w/rflx Culture, Routine    Meds ordered this encounter  Medications   nitrofurantoin, macrocrystal-monohydrate, (MACROBID) 100 MG capsule    Sig: Take 1 capsule (100 mg total) by mouth 2 (two) times daily for 7 days.    Dispense:  14 capsule    Refill:  0    Return if symptoms worsen or fail to improve.  [place] Controlled Substance Database was reviewed by me for overdose risk score (ORS)  Time spent:20 Minutes Time spent with patient included reviewing progress notes, labs, imaging studies, and discussing plan for follow up.   This patient was seen by Jonetta Osgood, FNP-C in collaboration with Dr. Clayborn Bigness as a part of collaborative care agreement.  Meliss Fleek R. Valetta Fuller,  MSN, FNP-C Internal Medicine

## 2022-01-04 ENCOUNTER — Other Ambulatory Visit: Payer: Self-pay

## 2022-01-04 MED ORDER — MECLIZINE HCL 25 MG PO TABS: 25.0000 mg | ORAL_TABLET | Freq: Three times a day (TID) | ORAL | 0 refills | Status: DC | PRN

## 2022-01-04 NOTE — Telephone Encounter (Signed)
Pt called that having vertigo as per alyssa send meclizine to [facility]

## 2022-01-10 ENCOUNTER — Encounter (INDEPENDENT_AMBULATORY_CARE_PROVIDER_SITE_OTHER): Payer: Medicare Other | Admitting: Ophthalmology

## 2022-01-10 ENCOUNTER — Other Ambulatory Visit: Payer: Self-pay

## 2022-01-10 DIAGNOSIS — H353112 Nonexudative age-related macular degeneration, right eye, intermediate dry stage: Secondary | ICD-10-CM | POA: Diagnosis not present

## 2022-01-10 DIAGNOSIS — H43813 Vitreous degeneration, bilateral: Secondary | ICD-10-CM

## 2022-01-10 DIAGNOSIS — H353221 Exudative age-related macular degeneration, left eye, with active choroidal neovascularization: Secondary | ICD-10-CM | POA: Diagnosis not present

## 2022-01-10 DIAGNOSIS — I10 Essential (primary) hypertension: Secondary | ICD-10-CM | POA: Diagnosis not present

## 2022-01-10 DIAGNOSIS — H35033 Hypertensive retinopathy, bilateral: Secondary | ICD-10-CM | POA: Diagnosis not present

## 2022-01-15 DIAGNOSIS — R2689 Other abnormalities of gait and mobility: Secondary | ICD-10-CM | POA: Diagnosis not present

## 2022-01-15 DIAGNOSIS — R419 Unspecified symptoms and signs involving cognitive functions and awareness: Secondary | ICD-10-CM | POA: Diagnosis not present

## 2022-01-15 DIAGNOSIS — R42 Dizziness and giddiness: Secondary | ICD-10-CM | POA: Diagnosis not present

## 2022-01-17 ENCOUNTER — Encounter: Payer: Self-pay | Admitting: Nurse Practitioner

## 2022-01-18 DIAGNOSIS — R419 Unspecified symptoms and signs involving cognitive functions and awareness: Secondary | ICD-10-CM | POA: Diagnosis not present

## 2022-01-18 DIAGNOSIS — I6782 Cerebral ischemia: Secondary | ICD-10-CM | POA: Diagnosis not present

## 2022-01-18 DIAGNOSIS — R413 Other amnesia: Secondary | ICD-10-CM | POA: Diagnosis not present

## 2022-02-01 DIAGNOSIS — R262 Difficulty in walking, not elsewhere classified: Secondary | ICD-10-CM | POA: Diagnosis not present

## 2022-02-07 ENCOUNTER — Other Ambulatory Visit: Payer: Self-pay

## 2022-02-07 ENCOUNTER — Encounter (INDEPENDENT_AMBULATORY_CARE_PROVIDER_SITE_OTHER): Payer: Medicare Other | Admitting: Ophthalmology

## 2022-02-07 DIAGNOSIS — H353221 Exudative age-related macular degeneration, left eye, with active choroidal neovascularization: Secondary | ICD-10-CM

## 2022-02-07 DIAGNOSIS — H35033 Hypertensive retinopathy, bilateral: Secondary | ICD-10-CM

## 2022-02-07 DIAGNOSIS — I10 Essential (primary) hypertension: Secondary | ICD-10-CM

## 2022-02-07 DIAGNOSIS — H353112 Nonexudative age-related macular degeneration, right eye, intermediate dry stage: Secondary | ICD-10-CM

## 2022-02-07 DIAGNOSIS — H43813 Vitreous degeneration, bilateral: Secondary | ICD-10-CM | POA: Diagnosis not present

## 2022-02-21 DIAGNOSIS — R262 Difficulty in walking, not elsewhere classified: Secondary | ICD-10-CM | POA: Diagnosis not present

## 2022-03-05 DIAGNOSIS — R262 Difficulty in walking, not elsewhere classified: Secondary | ICD-10-CM | POA: Diagnosis not present

## 2022-03-07 ENCOUNTER — Other Ambulatory Visit: Payer: Self-pay | Admitting: Internal Medicine

## 2022-03-07 ENCOUNTER — Encounter (INDEPENDENT_AMBULATORY_CARE_PROVIDER_SITE_OTHER): Payer: Medicare Other | Admitting: Ophthalmology

## 2022-03-07 DIAGNOSIS — H35033 Hypertensive retinopathy, bilateral: Secondary | ICD-10-CM | POA: Diagnosis not present

## 2022-03-07 DIAGNOSIS — H353112 Nonexudative age-related macular degeneration, right eye, intermediate dry stage: Secondary | ICD-10-CM

## 2022-03-07 DIAGNOSIS — H43813 Vitreous degeneration, bilateral: Secondary | ICD-10-CM

## 2022-03-07 DIAGNOSIS — I10 Essential (primary) hypertension: Secondary | ICD-10-CM

## 2022-03-07 DIAGNOSIS — H353221 Exudative age-related macular degeneration, left eye, with active choroidal neovascularization: Secondary | ICD-10-CM

## 2022-03-09 ENCOUNTER — Other Ambulatory Visit: Payer: Self-pay | Admitting: Internal Medicine

## 2022-03-10 ENCOUNTER — Other Ambulatory Visit: Payer: Self-pay

## 2022-03-10 MED ORDER — VALSARTAN-HYDROCHLOROTHIAZIDE 320-12.5 MG PO TABS: 1.0000 | ORAL_TABLET | Freq: Every day | ORAL | 3 refills | Status: DC

## 2022-04-02 DIAGNOSIS — R262 Difficulty in walking, not elsewhere classified: Secondary | ICD-10-CM | POA: Diagnosis not present

## 2022-04-10 ENCOUNTER — Ambulatory Visit (INDEPENDENT_AMBULATORY_CARE_PROVIDER_SITE_OTHER): Payer: Medicare Other | Admitting: Nurse Practitioner

## 2022-04-10 ENCOUNTER — Encounter: Payer: Self-pay | Admitting: Nurse Practitioner

## 2022-04-10 VITALS — BP 140/60 | HR 75 | Temp 98.4°F | Resp 16 | Ht 62.5 in | Wt 113.0 lb

## 2022-04-10 DIAGNOSIS — I10 Essential (primary) hypertension: Secondary | ICD-10-CM

## 2022-04-10 DIAGNOSIS — R42 Dizziness and giddiness: Secondary | ICD-10-CM | POA: Diagnosis not present

## 2022-04-10 DIAGNOSIS — E782 Mixed hyperlipidemia: Secondary | ICD-10-CM

## 2022-04-10 DIAGNOSIS — M810 Age-related osteoporosis without current pathological fracture: Secondary | ICD-10-CM

## 2022-04-10 NOTE — Progress Notes (Signed)
[facility] [place], [place] [address]  Internal MEDICINE  Office Visit Note  Patient Name: Amy Gilmore  [id number]  [id number]  Date of Service: 04/10/2022  Chief Complaint  Patient presents with   Follow-up   Hyperlipidemia   Hypertension    HPI Amy Gilmore presents for follow-up visit for hypertension, hyperlipidemia and routine follow-up.  Blood pressure was initially elevated but improved when rechecked, see vitals.  She has had all of her refill sent in.  She reports that she is currently going to a specialty clinic for vertigo and this has been for the past couple of months she has had vertigo for the past couple of years and she currently has meclizine prescribed as needed for episodes of dizziness. She does report having increased anxiety and was somewhat interested in taking medication to help calm her nerves but has decided she will wait for now.  She will be due for routine lab work and routine urinalysis in October this year.     Current Medication: Outpatient Encounter Medications as of 04/10/2022  Medication Sig   acetaminophen (TYLENOL) 500 MG tablet Take 1,000 mg by mouth every 6 (six) hours as needed. Pain    amLODipine (NORVASC) 5 MG tablet Take 1 tablet (5 mg total) by mouth daily.   aspirin 81 MG tablet Take 81 mg by mouth daily.   atorvastatin (LIPITOR) 10 MG tablet TAKE 1 TABLET(10 MG) BY MOUTH AT BEDTIME   Hypromellose 0.3 % SOLN Place 1-2 drops into both eyes 2 (two) times daily as needed. Dry eyes    meclizine (ANTIVERT) 25 MG tablet Take 1 tablet (25 mg total) by mouth 3 (three) times daily as needed for dizziness (vertigo).   meloxicam (MOBIC) 15 MG tablet Take one tab po qd prn for arthritis   Multiple Vitamins-Minerals (CENTRUM SILVER) tablet Take 1 tablet by mouth daily.   RESTASIS 0.05 % ophthalmic emulsion INSTILL 1 DROP INTO BOTH EYES TWICE A DAY   trimethoprim-polymyxin b (POLYTRIM) ophthalmic solution SMARTSIG:In  Eye(s)   valsartan-hydrochlorothiazide (DIOVAN-HCT) 320-12.5 MG tablet Take 1 tablet by mouth daily.   No facility-administered encounter medications on file as of 04/10/2022.    Surgical History: Past Surgical History:  Procedure Laterality Date   [place]   and osteotomy   COLONOSCOPY  2017   CYSTOSCOPY  02/22/2012   Procedure: CYSTOSCOPY;  Surgeon: Claybon Jabs, MD;  [facility];  Service: Urology;  Laterality: Right;  removal of ureteral stent   FLEXIBLE SIGMOIDOSCOPY  08/12/2012   Procedure: FLEXIBLE SIGMOIDOSCOPY;  Surgeon: Inda Castle, MD;  Location: [facility];  Service: Endoscopy;  Laterality: N/A;   NEPHROLITHOTOMY  01/28/2012-  right ureteral stent placement   Procedure: NEPHROLITHOTOMY PERCUTANEOUS;  Surgeon: Claybon Jabs, MD;  Location: [facility];  Service: Urology;  Laterality: Right;   OTHER SURGICAL HISTORY  01/28/12   right percutaneous nephroliathothoma   REFRACTIVE SURGERY  yrs ago   [place] and 2006   right   URETEROSCOPY  02/22/2012   Procedure: URETEROSCOPY;  Surgeon: Claybon Jabs, MD;  Location: [facility];  Service: Urology;  Laterality: Right;  URETEROSCOPY WITH STONE EXTRACTION    VAGINAL HYSTERECTOMY  1985    Medical History: Past Medical History:  Diagnosis Date   Adenomatous colon polyp 07/03/2012   Diverticulosis    Frequency of urination    History of kidney stones  Hyperlipidemia    Hypertension    Internal hemorrhoids    Mitral valve prolapse per dr wall note 09/12--  stable   PAC (premature atrial contraction) followed by cardiologist --  dr wall--  last visit  sept 12  note in epic--  states stable   Palpitations hx pac's-   occasional   Renal calculus, right followed by dr Karsten Ro   February 2013   Right shoulder pain Dec 07, 2011 fall   pain at times with positioning   Urgency of micturation    Vertigo, intermittent     Family  History: Family History  Problem Relation Age of Onset   Liver cancer Mother    Heart attack Sister    Peripheral vascular disease Sister    Colon cancer Neg Hx    Breast cancer Neg Hx     Social History   Socioeconomic History   Marital status: Married    Spouse name: Not on file   Number of children: 3   Years of education: Not on file   Highest education level: Not on file  Occupational History   Occupation: Housewife  Tobacco Use   Smoking status: Former    Packs/day: 1.00    Years: 15.00    Pack years: 15.00    Types: Cigarettes    Quit date: 12/04/1975    Years since quitting: 46.3   Smokeless tobacco: Never  Vaping Use   Vaping Use: Never used  Substance and Sexual Activity   Alcohol use: Not Currently    Alcohol/week: 7.0 standard drinks    Types: 7 Glasses of wine per week    Comment: 2-4 oz several times per week   Drug use: No   Sexual activity: Not on file  Other Topics Concern   Not on file  Social History Narrative   Married.   Gets regular exercise.   Caffeine daily. 3 cups of coffee or soda per day   Social Determinants of Health   Financial Resource Strain: Not on file  Food Insecurity: Not on file  Transportation Needs: Not on file  Physical Activity: Not on file  Stress: Not on file  Social Connections: Not on file  Intimate Partner Violence: Not on file      Review of Systems  Constitutional:  Negative for chills, fatigue and unexpected weight change.  HENT:  Negative for congestion, rhinorrhea, sneezing and sore throat.   Eyes:  Negative for redness.  Respiratory:  Negative for cough, chest tightness and shortness of breath.   Cardiovascular:  Negative for chest pain and palpitations.  Gastrointestinal:  Negative for abdominal pain, constipation, diarrhea, nausea and vomiting.  Genitourinary:  Negative for dysuria and frequency.  Musculoskeletal:  Negative for arthralgias, back pain, joint swelling and neck pain.  Skin:  Negative  for rash.  Neurological: Negative.  Negative for tremors and numbness.  Hematological:  Negative for adenopathy. Does not bruise/bleed easily.  Psychiatric/Behavioral:  Negative for behavioral problems (Depression), sleep disturbance and suicidal ideas. The patient is not nervous/anxious.    Vital Signs: BP 140/60 Comment: 154/86  Pulse 75   Temp 98.4 F (36.9 C)   Resp 16   Ht 5' 2.5" (1.588 m)   Wt 113 lb (51.3 kg)   SpO2 97%   BMI 20.34 kg/m    Physical Exam Vitals reviewed.  Constitutional:      General: She is not in acute distress.    Appearance: Normal appearance. She is normal weight. She is not  ill-appearing.  HENT:     Head: Normocephalic and atraumatic.  Eyes:     Pupils: Pupils are equal, round, and reactive to light.  Cardiovascular:     Rate and Rhythm: Normal rate and regular rhythm.  Neurological:     Mental Status: She is alert and oriented to person, place, and time.     Cranial Nerves: No cranial nerve deficit.     Coordination: Coordination normal.     Gait: Gait normal.  Psychiatric:        Mood and Affect: Mood normal.        Behavior: Behavior normal.       Assessment/Plan: 1. Essential hypertension Controlled with current medications  2. Mixed hyperlipidemia Continue atorvastatin as prescribed.   3. Senile osteoporosis Has been previously treated with Jaclyn Prime, managed by [facility] endocrinology.   4. Vertigo Takes meclizine prn, also goes to a specialty clinic at [facility] for dizziness and vertigo. Using alternative medicine to treat vertigo including decompression therapy and acupuncture.    General Counseling: Myiesha verbalizes understanding of the findings of todays visit and agrees with plan of treatment. I have discussed any further diagnostic evaluation that may be needed or ordered today. We also reviewed her medications today. she has been encouraged to call the office with any questions or concerns that should arise related to todays  visit.    No orders of the defined types were placed in this encounter.   No orders of the defined types were placed in this encounter.   Return in 24 weeks (on 09/25/2022) for previously scheduled, CPE, [facility] PCP.   Total time spent: 30 minutes Time spent includes review of chart, medications, test results, and follow up plan with the patient.   [place] Controlled Substance Database was reviewed by me.  This patient was seen by Jonetta Osgood, FNP-C in collaboration with Dr. Clayborn Bigness as a part of collaborative care agreement.   Makaiyah Schweiger R. Valetta Fuller, MSN, FNP-C Internal medicine

## 2022-04-11 ENCOUNTER — Encounter (INDEPENDENT_AMBULATORY_CARE_PROVIDER_SITE_OTHER): Payer: Medicare Other | Admitting: Ophthalmology

## 2022-04-11 DIAGNOSIS — H43813 Vitreous degeneration, bilateral: Secondary | ICD-10-CM | POA: Diagnosis not present

## 2022-04-11 DIAGNOSIS — I10 Essential (primary) hypertension: Secondary | ICD-10-CM

## 2022-04-11 DIAGNOSIS — H353112 Nonexudative age-related macular degeneration, right eye, intermediate dry stage: Secondary | ICD-10-CM | POA: Diagnosis not present

## 2022-04-11 DIAGNOSIS — H35033 Hypertensive retinopathy, bilateral: Secondary | ICD-10-CM

## 2022-04-11 DIAGNOSIS — H353221 Exudative age-related macular degeneration, left eye, with active choroidal neovascularization: Secondary | ICD-10-CM

## 2022-04-16 ENCOUNTER — Other Ambulatory Visit: Payer: Self-pay | Admitting: Internal Medicine

## 2022-05-16 ENCOUNTER — Encounter (INDEPENDENT_AMBULATORY_CARE_PROVIDER_SITE_OTHER): Payer: Medicare Other | Admitting: Ophthalmology

## 2022-05-16 DIAGNOSIS — I10 Essential (primary) hypertension: Secondary | ICD-10-CM

## 2022-05-16 DIAGNOSIS — H35033 Hypertensive retinopathy, bilateral: Secondary | ICD-10-CM | POA: Diagnosis not present

## 2022-05-16 DIAGNOSIS — H353221 Exudative age-related macular degeneration, left eye, with active choroidal neovascularization: Secondary | ICD-10-CM

## 2022-05-16 DIAGNOSIS — H353112 Nonexudative age-related macular degeneration, right eye, intermediate dry stage: Secondary | ICD-10-CM | POA: Diagnosis not present

## 2022-05-16 DIAGNOSIS — H43813 Vitreous degeneration, bilateral: Secondary | ICD-10-CM

## 2022-06-03 ENCOUNTER — Encounter: Payer: Self-pay | Admitting: Nurse Practitioner

## 2022-06-20 ENCOUNTER — Encounter (INDEPENDENT_AMBULATORY_CARE_PROVIDER_SITE_OTHER): Payer: Medicare Other | Admitting: Ophthalmology

## 2022-06-20 DIAGNOSIS — H43813 Vitreous degeneration, bilateral: Secondary | ICD-10-CM

## 2022-06-20 DIAGNOSIS — H353112 Nonexudative age-related macular degeneration, right eye, intermediate dry stage: Secondary | ICD-10-CM | POA: Diagnosis not present

## 2022-06-20 DIAGNOSIS — I10 Essential (primary) hypertension: Secondary | ICD-10-CM | POA: Diagnosis not present

## 2022-06-20 DIAGNOSIS — H35033 Hypertensive retinopathy, bilateral: Secondary | ICD-10-CM

## 2022-06-20 DIAGNOSIS — H353221 Exudative age-related macular degeneration, left eye, with active choroidal neovascularization: Secondary | ICD-10-CM

## 2022-08-01 ENCOUNTER — Encounter (INDEPENDENT_AMBULATORY_CARE_PROVIDER_SITE_OTHER): Payer: Medicare Other | Admitting: Ophthalmology

## 2022-08-01 DIAGNOSIS — H353112 Nonexudative age-related macular degeneration, right eye, intermediate dry stage: Secondary | ICD-10-CM | POA: Diagnosis not present

## 2022-08-01 DIAGNOSIS — H43813 Vitreous degeneration, bilateral: Secondary | ICD-10-CM | POA: Diagnosis not present

## 2022-08-01 DIAGNOSIS — H353221 Exudative age-related macular degeneration, left eye, with active choroidal neovascularization: Secondary | ICD-10-CM

## 2022-08-01 DIAGNOSIS — I10 Essential (primary) hypertension: Secondary | ICD-10-CM

## 2022-08-01 DIAGNOSIS — H35033 Hypertensive retinopathy, bilateral: Secondary | ICD-10-CM | POA: Diagnosis not present

## 2022-09-10 ENCOUNTER — Telehealth (INDEPENDENT_AMBULATORY_CARE_PROVIDER_SITE_OTHER): Payer: Medicare Other | Admitting: Nurse Practitioner

## 2022-09-10 ENCOUNTER — Encounter: Payer: Self-pay | Admitting: Nurse Practitioner

## 2022-09-10 VITALS — Temp 100.0°F | Resp 16 | Ht 62.5 in | Wt 105.0 lb

## 2022-09-10 DIAGNOSIS — U071 COVID-19: Secondary | ICD-10-CM

## 2022-09-10 DIAGNOSIS — J069 Acute upper respiratory infection, unspecified: Secondary | ICD-10-CM | POA: Diagnosis not present

## 2022-09-10 DIAGNOSIS — R051 Acute cough: Secondary | ICD-10-CM | POA: Diagnosis not present

## 2022-09-10 MED ORDER — MOLNUPIRAVIR EUA 200MG CAPSULE: 4.0000 | ORAL_CAPSULE | Freq: Two times a day (BID) | ORAL | 0 refills | Status: AC

## 2022-09-10 MED ORDER — HYDROCOD POLI-CHLORPHE POLI ER 10-8 MG/5ML PO SUER: 5.0000 mL | Freq: Two times a day (BID) | ORAL | 0 refills | Status: DC | PRN

## 2022-09-10 NOTE — Progress Notes (Signed)
[facility] [place], [place] [address]  Internal MEDICINE  Telephone Visit  Patient Name: Amy Gilmore  [id number]  [id number]  Date of Service: 09/10/2022  I connected with the patient at 1250 by telephone and verified the patients identity using two identifiers.   I discussed the limitations, risks, security and privacy concerns of performing an evaluation and management service by telephone and the availability of in person appointments. I also discussed with the patient that there may be a patient responsible charge related to the service.  The patient expressed understanding and agrees to proceed.    Chief Complaint  Patient presents with   Telephone Screen    [phone]   Telephone Assessment    Covid positive today   Cough   Fever    HPI Amy Gilmore presents for a telehealth virtual visit for cough and fever, covid positive today. Onset was yesterday.    Current Medication: Outpatient Encounter Medications as of 09/10/2022  Medication Sig   acetaminophen (TYLENOL) 500 MG tablet Take 1,000 mg by mouth every 6 (six) hours as needed. Pain    aspirin 81 MG tablet Take 81 mg by mouth daily.   chlorpheniramine-HYDROcodone (TUSSIONEX) 10-8 MG/5ML Take 5 mLs by mouth every 12 (twelve) hours as needed for cough.   Hypromellose 0.3 % SOLN Place 1-2 drops into both eyes 2 (two) times daily as needed. Dry eyes    meclizine (ANTIVERT) 25 MG tablet Take 1 tablet (25 mg total) by mouth 3 (three) times daily as needed for dizziness (vertigo).   [EXPIRED] molnupiravir EUA (LAGEVRIO) 200 mg CAPS capsule Take 4 capsules (800 mg total) by mouth 2 (two) times daily for 5 days.   Multiple Vitamins-Minerals (CENTRUM SILVER) tablet Take 1 tablet by mouth daily.   RESTASIS 0.05 % ophthalmic emulsion INSTILL 1 DROP INTO BOTH EYES TWICE A DAY   trimethoprim-polymyxin b (POLYTRIM) ophthalmic solution SMARTSIG:In Eye(s)   valsartan-hydrochlorothiazide (DIOVAN-HCT)  320-12.5 MG tablet TAKE 1 TABLET BY MOUTH DAILY FOR HIGH BLOOD PRESSURE   [DISCONTINUED] amLODipine (NORVASC) 5 MG tablet Take 1 tablet (5 mg total) by mouth daily.   [DISCONTINUED] atorvastatin (LIPITOR) 10 MG tablet TAKE 1 TABLET(10 MG) BY MOUTH AT BEDTIME   [DISCONTINUED] meloxicam (MOBIC) 15 MG tablet Take one tab po qd prn for arthritis   No facility-administered encounter medications on file as of 09/10/2022.    Surgical History: Past Surgical History:  Procedure Laterality Date   [place]   and osteotomy   COLONOSCOPY  2017   CYSTOSCOPY  02/22/2012   Procedure: CYSTOSCOPY;  Surgeon: Claybon Jabs, MD;  Location: [facility];  Service: Urology;  Laterality: Right;  removal of ureteral stent   FLEXIBLE SIGMOIDOSCOPY  08/12/2012   Procedure: FLEXIBLE SIGMOIDOSCOPY;  Surgeon: Inda Castle, MD;  Location: [facility];  Service: Endoscopy;  Laterality: N/A;   NEPHROLITHOTOMY  01/28/2012-  right ureteral stent placement   Procedure: NEPHROLITHOTOMY PERCUTANEOUS;  Surgeon: Claybon Jabs, MD;  Location: [facility];  Service: Urology;  Laterality: Right;   OTHER SURGICAL HISTORY  01/28/12   right percutaneous nephroliathothoma   REFRACTIVE SURGERY  yrs ago   [place] and 2006   right   URETEROSCOPY  02/22/2012   Procedure: URETEROSCOPY;  Surgeon: Claybon Jabs, MD;  Location: [facility];  Service: Urology;  Laterality: Right;  URETEROSCOPY WITH STONE EXTRACTION    VAGINAL HYSTERECTOMY  1985  Medical History: Past Medical History:  Diagnosis Date   Adenomatous colon polyp 07/03/2012   Diverticulosis    Frequency of urination    History of kidney stones    Hyperlipidemia    Hypertension    Internal hemorrhoids    Mitral valve prolapse per dr wall note 09/12--  stable   PAC (premature atrial contraction) followed by cardiologist --  dr wall--  last visit  sept 12  note in epic--  states stable    Palpitations hx pac's-   occasional   Renal calculus, right followed by dr Karsten Ro   February 2013   Right shoulder pain Dec 07, 2011 fall   pain at times with positioning   Urgency of micturation    Vertigo, intermittent     Family History: Family History  Problem Relation Age of Onset   Liver cancer Mother    Heart attack Sister    Peripheral vascular disease Sister    Colon cancer Neg Hx    Breast cancer Neg Hx     Social History   Socioeconomic History   Marital status: Married    Spouse name: Not on file   Number of children: 3   Years of education: Not on file   Highest education level: Not on file  Occupational History   Occupation: Housewife  Tobacco Use   Smoking status: Former    Packs/day: 1.00    Years: 15.00    Total pack years: 15.00    Types: Cigarettes    Quit date: 12/04/1975    Years since quitting: 46.8   Smokeless tobacco: Never  Vaping Use   Vaping Use: Never used  Substance and Sexual Activity   Alcohol use: Not Currently    Alcohol/week: 7.0 standard drinks of alcohol    Types: 7 Glasses of wine per week    Comment: 2-4 oz several times per week   Drug use: No   Sexual activity: Not on file  Other Topics Concern   Not on file  Social History Narrative   Married.   Gets regular exercise.   Caffeine daily. 3 cups of coffee or soda per day   Social Determinants of Health   Financial Resource Strain: Not on file  Food Insecurity: Not on file  Transportation Needs: Not on file  Physical Activity: Not on file  Stress: Not on file  Social Connections: Not on file  Intimate Partner Violence: Not on file      Review of Systems  Constitutional:  Positive for fatigue and fever.  Respiratory:  Positive for cough.   Cardiovascular:  Negative for chest pain and palpitations.  Musculoskeletal:  Positive for myalgias. Negative for joint swelling.  Neurological:  Positive for headaches.    Vital Signs: Temp 100 F (37.8 C)   Resp 16    Ht 5' 2.5" (1.588 m)   Wt 105 lb (47.6 kg)   BMI 18.90 kg/m    Observation/Objective: She is alert and oriented and engages in conversation appropriately. She does not sound as though she is in any acute distress over telephone call.     Assessment/Plan: 1. Upper respiratory tract infection due to COVID-19 virus Antiviral medication prescribed.  - molnupiravir EUA (LAGEVRIO) 200 mg CAPS capsule; Take 4 capsules (800 mg total) by mouth 2 (two) times daily for 5 days.  Dispense: 40 capsule; Refill: 0  2. Acute cough Medication for symptomatic relief of cough prescribed - chlorpheniramine-HYDROcodone (TUSSIONEX) 10-8 MG/5ML; Take 5 mLs by mouth every  12 (twelve) hours as needed for cough.  Dispense: 140 mL; Refill: 0   General Counseling: Amy Gilmore verbalizes understanding of the findings of today's phone visit and agrees with plan of treatment. I have discussed any further diagnostic evaluation that may be needed or ordered today. We also reviewed her medications today. she has been encouraged to call the office with any questions or concerns that should arise related to todays visit.  Return if symptoms worsen or fail to improve.   No orders of the defined types were placed in this encounter.   Meds ordered this encounter  Medications   molnupiravir EUA (LAGEVRIO) 200 mg CAPS capsule    Sig: Take 4 capsules (800 mg total) by mouth 2 (two) times daily for 5 days.    Dispense:  40 capsule    Refill:  0    Fill today asap   chlorpheniramine-HYDROcodone (TUSSIONEX) 10-8 MG/5ML    Sig: Take 5 mLs by mouth every 12 (twelve) hours as needed for cough.    Dispense:  140 mL    Refill:  0    Please fill today    Time spent:10 Minutes Time spent with patient included reviewing progress notes, labs, imaging studies, and discussing plan for follow up.  [place] Controlled Substance Database was reviewed by me for overdose risk score (ORS) if appropriate.  This patient was seen by Jonetta Osgood, FNP-C in collaboration with Dr. Clayborn Bigness as a part of collaborative care agreement.  Rip Hawes R. Valetta Fuller, MSN, FNP-C Internal medicine

## 2022-09-19 ENCOUNTER — Encounter (INDEPENDENT_AMBULATORY_CARE_PROVIDER_SITE_OTHER): Payer: Medicare Other | Admitting: Ophthalmology

## 2022-09-19 DIAGNOSIS — H353112 Nonexudative age-related macular degeneration, right eye, intermediate dry stage: Secondary | ICD-10-CM

## 2022-09-19 DIAGNOSIS — I10 Essential (primary) hypertension: Secondary | ICD-10-CM

## 2022-09-19 DIAGNOSIS — H43813 Vitreous degeneration, bilateral: Secondary | ICD-10-CM

## 2022-09-19 DIAGNOSIS — H35033 Hypertensive retinopathy, bilateral: Secondary | ICD-10-CM | POA: Diagnosis not present

## 2022-09-19 DIAGNOSIS — H353221 Exudative age-related macular degeneration, left eye, with active choroidal neovascularization: Secondary | ICD-10-CM

## 2022-09-25 ENCOUNTER — Ambulatory Visit: Payer: Medicare Other | Admitting: Nurse Practitioner

## 2022-10-15 ENCOUNTER — Ambulatory Visit (INDEPENDENT_AMBULATORY_CARE_PROVIDER_SITE_OTHER): Payer: Medicare Other | Admitting: Nurse Practitioner

## 2022-10-15 ENCOUNTER — Encounter: Payer: Self-pay | Admitting: Nurse Practitioner

## 2022-10-15 VITALS — BP 135/80 | HR 80 | Temp 98.2°F | Resp 16 | Ht 62.5 in | Wt 108.2 lb

## 2022-10-15 DIAGNOSIS — R300 Dysuria: Secondary | ICD-10-CM

## 2022-10-15 DIAGNOSIS — E782 Mixed hyperlipidemia: Secondary | ICD-10-CM

## 2022-10-15 DIAGNOSIS — Z760 Encounter for issue of repeat prescription: Secondary | ICD-10-CM | POA: Diagnosis not present

## 2022-10-15 DIAGNOSIS — R7301 Impaired fasting glucose: Secondary | ICD-10-CM

## 2022-10-15 DIAGNOSIS — Z0001 Encounter for general adult medical examination with abnormal findings: Secondary | ICD-10-CM | POA: Diagnosis not present

## 2022-10-15 MED ORDER — MELOXICAM 15 MG PO TABS
ORAL_TABLET | ORAL | 3 refills | Status: DC
Start: 2022-10-15 — End: 2023-04-15

## 2022-10-15 MED ORDER — AMLODIPINE BESYLATE 5 MG PO TABS
5.0000 mg | ORAL_TABLET | Freq: Every day | ORAL | 3 refills | Status: DC
Start: 2022-10-15 — End: 2023-04-15

## 2022-10-15 MED ORDER — ATORVASTATIN CALCIUM 10 MG PO TABS
ORAL_TABLET | ORAL | 2 refills | Status: DC
Start: 2022-10-15 — End: 2023-04-15

## 2022-10-15 NOTE — Progress Notes (Signed)
[facility] [place], [place] [address]  Internal MEDICINE  Office Visit Note  Patient Name: Amy Gilmore  [id number]  [id number]  Date of Service: 10/15/2022  Chief Complaint  Patient presents with   Medicare Wellness   Hypertension   Hyperlipidemia    HPI Amy Gilmore presents for an annual well visit and physical exam. Well-appearing 86 year old female with hypertension, high cholesterol, mitral valve prolapse and insufficiency and history of TIA. -- due for routine colonoscopy in may next year. --DEXA scan done in 2022 at [facility] --mammogram was last done in 2021 at [facility] --recommended vaccinations are pneumonia and shingles. Flu? --no significant changes from last year. No new or worsening pain.     Current Medication: Outpatient Encounter Medications as of 10/15/2022  Medication Sig   acetaminophen (TYLENOL) 500 MG tablet Take 1,000 mg by mouth every 6 (six) hours as needed. Pain    aspirin 81 MG tablet Take 81 mg by mouth daily.   chlorpheniramine-HYDROcodone (TUSSIONEX) 10-8 MG/5ML Take 5 mLs by mouth every 12 (twelve) hours as needed for cough.   Hypromellose 0.3 % SOLN Place 1-2 drops into both eyes 2 (two) times daily as needed. Dry eyes    meclizine (ANTIVERT) 25 MG tablet Take 1 tablet (25 mg total) by mouth 3 (three) times daily as needed for dizziness (vertigo).   Multiple Vitamins-Minerals (CENTRUM SILVER) tablet Take 1 tablet by mouth daily.   RESTASIS 0.05 % ophthalmic emulsion INSTILL 1 DROP INTO BOTH EYES TWICE A DAY   trimethoprim-polymyxin b (POLYTRIM) ophthalmic solution SMARTSIG:In Eye(s)   valsartan-hydrochlorothiazide (DIOVAN-HCT) 320-12.5 MG tablet TAKE 1 TABLET BY MOUTH DAILY FOR HIGH BLOOD PRESSURE   [DISCONTINUED] amLODipine (NORVASC) 5 MG tablet Take 1 tablet (5 mg total) by mouth daily.   [DISCONTINUED] atorvastatin (LIPITOR) 10 MG tablet TAKE 1 TABLET(10 MG) BY MOUTH AT BEDTIME    [DISCONTINUED] meloxicam (MOBIC) 15 MG tablet Take one tab po qd prn for arthritis   amLODipine (NORVASC) 5 MG tablet Take 1 tablet (5 mg total) by mouth daily.   atorvastatin (LIPITOR) 10 MG tablet TAKE 1 TABLET(10 MG) BY MOUTH AT BEDTIME   meloxicam (MOBIC) 15 MG tablet Take one tab po qd prn for arthritis   No facility-administered encounter medications on file as of 10/15/2022.    Surgical History: Past Surgical History:  Procedure Laterality Date   [place]   and osteotomy   COLONOSCOPY  2017   CYSTOSCOPY  02/22/2012   Procedure: CYSTOSCOPY;  Surgeon: Claybon Jabs, MD;  [facility];  Service: Urology;  Laterality: Right;  removal of ureteral stent   FLEXIBLE SIGMOIDOSCOPY  08/12/2012   Procedure: FLEXIBLE SIGMOIDOSCOPY;  Surgeon: Inda Castle, MD;  Location: [facility];  Service: Endoscopy;  Laterality: N/A;   NEPHROLITHOTOMY  01/28/2012-  right ureteral stent placement   Procedure: NEPHROLITHOTOMY PERCUTANEOUS;  Surgeon: Claybon Jabs, MD;  Location: [facility];  Service: Urology;  Laterality: Right;   OTHER SURGICAL HISTORY  01/28/12   right percutaneous nephroliathothoma   REFRACTIVE SURGERY  yrs ago   [place] and 2006   right   URETEROSCOPY  02/22/2012   Procedure: URETEROSCOPY;  Surgeon: Claybon Jabs, MD;  Location: [facility];  Service: Urology;  Laterality: Right;  URETEROSCOPY WITH STONE EXTRACTION    VAGINAL HYSTERECTOMY  1985    Medical History: Past Medical History:  Diagnosis Date   Adenomatous colon polyp 07/03/2012   Diverticulosis    Frequency of urination    History of kidney stones    Hyperlipidemia    Hypertension    Internal hemorrhoids    Mitral valve prolapse per dr wall note 09/12--  stable   PAC (premature atrial contraction) followed by cardiologist --  dr wall--  last visit  sept 12  note in epic--  states stable   Palpitations hx pac's-   occasional    Renal calculus, right followed by dr Karsten Ro   February 2013   Right shoulder pain Dec 07, 2011 fall   pain at times with positioning   Urgency of micturation    Vertigo, intermittent     Family History: Family History  Problem Relation Age of Onset   Liver cancer Mother    Heart attack Sister    Peripheral vascular disease Sister    Colon cancer Neg Hx    Breast cancer Neg Hx     Social History   Socioeconomic History   Marital status: Married    Spouse name: Not on file   Number of children: 3   Years of education: Not on file   Highest education level: Not on file  Occupational History   Occupation: Housewife  Tobacco Use   Smoking status: Former    Packs/day: 1.00    Years: 15.00    Total pack years: 15.00    Types: Cigarettes    Quit date: 12/04/1975    Years since quitting: 46.8   Smokeless tobacco: Never  Vaping Use   Vaping Use: Never used  Substance and Sexual Activity   Alcohol use: Not Currently    Alcohol/week: 7.0 standard drinks of alcohol    Types: 7 Glasses of wine per week    Comment: 2-4 oz several times per week   Drug use: No   Sexual activity: Not on file  Other Topics Concern   Not on file  Social History Narrative   Married.   Gets regular exercise.   Caffeine daily. 3 cups of coffee or soda per day   Social Determinants of Health   Financial Resource Strain: Not on file  Food Insecurity: Not on file  Transportation Needs: Not on file  Physical Activity: Not on file  Stress: Not on file  Social Connections: Not on file  Intimate Partner Violence: Not on file      Review of Systems  Constitutional:  Negative for chills, fatigue and unexpected weight change.  HENT:  Negative for congestion, postnasal drip, rhinorrhea, sneezing and sore throat.   Eyes:  Negative for redness.  Respiratory:  Negative for cough, chest tightness and shortness of breath.   Cardiovascular:  Negative for chest pain and palpitations.  Gastrointestinal:   Negative for abdominal pain, constipation, diarrhea, nausea and vomiting.  Genitourinary:  Negative for dysuria and frequency.  Musculoskeletal:  Positive for arthralgias and neck pain. Negative for back pain and joint swelling.  Skin:  Negative for rash.  Neurological: Negative.  Negative for tremors and numbness.  Hematological:  Negative for adenopathy. Does not bruise/bleed easily.  Psychiatric/Behavioral:  Negative for behavioral problems (Depression), sleep disturbance and suicidal ideas. The patient is not nervous/anxious.     Vital Signs: BP 135/80 Comment: 148/83  Pulse 80   Temp 98.2 F (36.8 C)   Resp 16   Ht 5' 2.5" (1.588 m)   Wt 108 lb 3.2 oz (49.1 kg)   SpO2 98%  BMI 19.47 kg/m    Physical Exam Vitals reviewed.  Constitutional:      General: She is not in acute distress.    Appearance: She is well-developed. She is not diaphoretic.  HENT:     Head: Normocephalic and atraumatic.     Mouth/Throat:     Pharynx: No oropharyngeal exudate.  Eyes:     Pupils: Pupils are equal, round, and reactive to light.  Neck:     Thyroid: No thyromegaly.     Vascular: No JVD.     Trachea: No tracheal deviation.  Cardiovascular:     Rate and Rhythm: Normal rate and regular rhythm.     Heart sounds: Normal heart sounds. No murmur heard.    No friction rub. No gallop.  Pulmonary:     Effort: Pulmonary effort is normal. No respiratory distress.     Breath sounds: No wheezing or rales.  Chest:     Chest wall: No tenderness.  Abdominal:     General: Bowel sounds are normal.     Palpations: Abdomen is soft.  Musculoskeletal:        General: Normal range of motion.     Cervical back: Normal range of motion and neck supple.  Lymphadenopathy:     Cervical: No cervical adenopathy.  Skin:    General: Skin is warm and dry.  Neurological:     Mental Status: She is alert and oriented to person, place, and time.     Cranial Nerves: No cranial nerve deficit.  Psychiatric:         Behavior: Behavior normal.        Thought Content: Thought content normal.        Judgment: Judgment normal.        Assessment/Plan: 1. Encounter for general adult medical examination with abnormal findings Age-appropriate preventive screenings and vaccinations discussed, annual physical exam completed. Routine labs for health maintenance ordered, see below. PHM updated.  - CBC with Differential/Platelet - Lipid Profile - CMP14+EGFR - Hgb A1C w/o eAG  2. Impaired fasting glucose Routine labs ordered - CBC with Differential/Platelet - Lipid Profile - CMP14+EGFR - Hgb A1C w/o eAG  3. Mixed hyperlipidemia Routine labs ordered - Lipid Profile - Hgb A1C w/o eAG  4. Dysuria Routine urinalysis done - UA/M w/rflx Culture, Routine  5. Medication refill - amLODipine (NORVASC) 5 MG tablet; Take 1 tablet (5 mg total) by mouth daily.  Dispense: 90 tablet; Refill: 3 - atorvastatin (LIPITOR) 10 MG tablet; TAKE 1 TABLET(10 MG) BY MOUTH AT BEDTIME  Dispense: 90 tablet; Refill: 2 - meloxicam (MOBIC) 15 MG tablet; Take one tab po qd prn for arthritis  Dispense: 30 tablet; Refill: 3      General Counseling: Amy Gilmore verbalizes understanding of the findings of todays visit and agrees with plan of treatment. I have discussed any further diagnostic evaluation that may be needed or ordered today. We also reviewed her medications today. she has been encouraged to call the office with any questions or concerns that should arise related to todays visit.    Orders Placed This Encounter  Procedures   CBC with Differential/Platelet   Lipid Profile   CMP14+EGFR   Hgb A1C w/o eAG   UA/M w/rflx Culture, Routine    Meds ordered this encounter  Medications   amLODipine (NORVASC) 5 MG tablet    Sig: Take 1 tablet (5 mg total) by mouth daily.    Dispense:  90 tablet    Refill:  3  atorvastatin (LIPITOR) 10 MG tablet    Sig: TAKE 1 TABLET(10 MG) BY MOUTH AT BEDTIME    Dispense:  90 tablet     Refill:  2   meloxicam (MOBIC) 15 MG tablet    Sig: Take one tab po qd prn for arthritis    Dispense:  30 tablet    Refill:  3    Return in about 6 months (around 04/15/2023) for F/U, Amy PCP.   Total time spent:30 Minutes Time spent includes review of chart, medications, test results, and follow up plan with the patient.   [place] Controlled Substance Database was reviewed by me.  This patient was seen by Jonetta Osgood, FNP-C in collaboration with Dr. Clayborn Bigness as a part of collaborative care agreement.  Amy R. Valetta Fuller, MSN, FNP-C Internal medicine

## 2022-10-16 LAB — UA/M W/RFLX CULTURE, ROUTINE
Bilirubin, UA: NEGATIVE
Glucose, UA: NEGATIVE
Ketones, UA: NEGATIVE
Leukocytes,UA: NEGATIVE
Nitrite, UA: NEGATIVE
Protein,UA: NEGATIVE
RBC, UA: NEGATIVE
Specific Gravity, UA: 1.013 (ref 1.005–1.030)
Urobilinogen, Ur: 0.2 mg/dL (ref 0.2–1.0)
pH, UA: 7 (ref 5.0–7.5)

## 2022-10-16 LAB — MICROSCOPIC EXAMINATION
Bacteria, UA: NONE SEEN
Casts: NONE SEEN /lpf
Epithelial Cells (non renal): NONE SEEN /hpf (ref 0–10)
RBC, Urine: NONE SEEN /hpf (ref 0–2)
WBC, UA: NONE SEEN /hpf (ref 0–5)

## 2022-11-07 ENCOUNTER — Encounter (INDEPENDENT_AMBULATORY_CARE_PROVIDER_SITE_OTHER): Payer: Medicare Other | Admitting: Ophthalmology

## 2022-11-07 DIAGNOSIS — I10 Essential (primary) hypertension: Secondary | ICD-10-CM

## 2022-11-07 DIAGNOSIS — H43813 Vitreous degeneration, bilateral: Secondary | ICD-10-CM

## 2022-11-07 DIAGNOSIS — H353112 Nonexudative age-related macular degeneration, right eye, intermediate dry stage: Secondary | ICD-10-CM

## 2022-11-07 DIAGNOSIS — H353221 Exudative age-related macular degeneration, left eye, with active choroidal neovascularization: Secondary | ICD-10-CM | POA: Diagnosis not present

## 2022-11-07 DIAGNOSIS — H35033 Hypertensive retinopathy, bilateral: Secondary | ICD-10-CM

## 2022-12-18 DIAGNOSIS — R419 Unspecified symptoms and signs involving cognitive functions and awareness: Secondary | ICD-10-CM | POA: Diagnosis not present

## 2022-12-21 NOTE — Addendum Note (Signed)
Addended by: Lavera Guise on: 12/21/2022 08:15 PM   Modules accepted: Level of Service

## 2022-12-26 ENCOUNTER — Encounter (INDEPENDENT_AMBULATORY_CARE_PROVIDER_SITE_OTHER): Payer: Medicare Other | Admitting: Ophthalmology

## 2022-12-26 DIAGNOSIS — H43813 Vitreous degeneration, bilateral: Secondary | ICD-10-CM | POA: Diagnosis not present

## 2022-12-26 DIAGNOSIS — H353221 Exudative age-related macular degeneration, left eye, with active choroidal neovascularization: Secondary | ICD-10-CM | POA: Diagnosis not present

## 2022-12-26 DIAGNOSIS — H353112 Nonexudative age-related macular degeneration, right eye, intermediate dry stage: Secondary | ICD-10-CM | POA: Diagnosis not present

## 2022-12-26 DIAGNOSIS — I10 Essential (primary) hypertension: Secondary | ICD-10-CM

## 2022-12-26 DIAGNOSIS — H35033 Hypertensive retinopathy, bilateral: Secondary | ICD-10-CM | POA: Diagnosis not present

## 2023-02-20 ENCOUNTER — Encounter (INDEPENDENT_AMBULATORY_CARE_PROVIDER_SITE_OTHER): Payer: Medicare Other | Admitting: Ophthalmology

## 2023-02-20 DIAGNOSIS — H353221 Exudative age-related macular degeneration, left eye, with active choroidal neovascularization: Secondary | ICD-10-CM

## 2023-02-20 DIAGNOSIS — I10 Essential (primary) hypertension: Secondary | ICD-10-CM

## 2023-02-20 DIAGNOSIS — H353112 Nonexudative age-related macular degeneration, right eye, intermediate dry stage: Secondary | ICD-10-CM

## 2023-02-20 DIAGNOSIS — H35033 Hypertensive retinopathy, bilateral: Secondary | ICD-10-CM | POA: Diagnosis not present

## 2023-02-20 DIAGNOSIS — H43813 Vitreous degeneration, bilateral: Secondary | ICD-10-CM | POA: Diagnosis not present

## 2023-02-26 DIAGNOSIS — Z961 Presence of intraocular lens: Secondary | ICD-10-CM | POA: Diagnosis not present

## 2023-02-26 DIAGNOSIS — H40003 Preglaucoma, unspecified, bilateral: Secondary | ICD-10-CM | POA: Diagnosis not present

## 2023-02-26 DIAGNOSIS — H04123 Dry eye syndrome of bilateral lacrimal glands: Secondary | ICD-10-CM | POA: Diagnosis not present

## 2023-04-15 ENCOUNTER — Encounter: Payer: Self-pay | Admitting: Nurse Practitioner

## 2023-04-15 ENCOUNTER — Ambulatory Visit (INDEPENDENT_AMBULATORY_CARE_PROVIDER_SITE_OTHER): Payer: Medicare Other | Admitting: Nurse Practitioner

## 2023-04-15 VITALS — BP 138/80 | HR 76 | Temp 97.9°F | Resp 16 | Ht 62.5 in | Wt 111.6 lb

## 2023-04-15 DIAGNOSIS — R42 Dizziness and giddiness: Secondary | ICD-10-CM

## 2023-04-15 DIAGNOSIS — I10 Essential (primary) hypertension: Secondary | ICD-10-CM

## 2023-04-15 DIAGNOSIS — E782 Mixed hyperlipidemia: Secondary | ICD-10-CM | POA: Diagnosis not present

## 2023-04-15 DIAGNOSIS — M159 Polyosteoarthritis, unspecified: Secondary | ICD-10-CM | POA: Diagnosis not present

## 2023-04-15 MED ORDER — ATORVASTATIN CALCIUM 10 MG PO TABS
ORAL_TABLET | ORAL | 2 refills | Status: DC
Start: 2023-04-15 — End: 2023-12-23

## 2023-04-15 MED ORDER — MELOXICAM 15 MG PO TABS: ORAL_TABLET | ORAL | 3 refills | Status: DC

## 2023-04-15 MED ORDER — HYDROXYZINE HCL 25 MG PO TABS
25.0000 mg | ORAL_TABLET | Freq: Three times a day (TID) | ORAL | 0 refills | Status: DC | PRN
Start: 2023-04-15 — End: 2023-12-23

## 2023-04-15 MED ORDER — VALSARTAN-HYDROCHLOROTHIAZIDE 320-12.5 MG PO TABS
1.0000 | ORAL_TABLET | Freq: Every day | ORAL | 3 refills | Status: DC
Start: 2023-04-15 — End: 2023-08-26

## 2023-04-15 MED ORDER — AMLODIPINE BESYLATE 5 MG PO TABS: 5.0000 mg | ORAL_TABLET | Freq: Every day | ORAL | 3 refills | Status: DC

## 2023-04-15 NOTE — Progress Notes (Signed)
[facility] [address] [place], [place] [address]  Internal MEDICINE  Office Visit Note  Patient Name: Amy Gilmore  [id number]  [id number]  Date of Service: 04/15/2023  Chief Complaint  Patient presents with   Hyperlipidemia   Hypertension   Follow-up    HPI Amy Gilmore presents for a follow-up visit for vertigo, hypertension, and refills Vertigo/dizziness -- getting acupuncture to treat this, it is helping some. Meclizine does not help.  Hypertension -- controlled with current meds, taking valsartan-HCTZ and amlodipine. High cholesterol -- takes atorvastatin Arthritis -- wants to keep meloxicam on hand for joint pain    Current Medication: Outpatient Encounter Medications as of 04/15/2023  Medication Sig   acetaminophen (TYLENOL) 500 MG tablet Take 1,000 mg by mouth every 6 (six) hours as needed. Pain    aspirin 81 MG tablet Take 81 mg by mouth daily.   hydrOXYzine (ATARAX) 25 MG tablet Take 1 tablet (25 mg total) by mouth 3 (three) times daily as needed (dizziness and vertigo).   Hypromellose 0.3 % SOLN Place 1-2 drops into both eyes 2 (two) times daily as needed. Dry eyes    meclizine (ANTIVERT) 25 MG tablet Take 1 tablet (25 mg total) by mouth 3 (three) times daily as needed for dizziness (vertigo).   Multiple Vitamins-Minerals (CENTRUM SILVER) tablet Take 1 tablet by mouth daily.   RESTASIS 0.05 % ophthalmic emulsion INSTILL 1 DROP INTO BOTH EYES TWICE A DAY   trimethoprim-polymyxin b (POLYTRIM) ophthalmic solution SMARTSIG:In Eye(s)   [DISCONTINUED] amLODipine (NORVASC) 5 MG tablet Take 1 tablet (5 mg total) by mouth daily.   [DISCONTINUED] atorvastatin (LIPITOR) 10 MG tablet TAKE 1 TABLET(10 MG) BY MOUTH AT BEDTIME   [DISCONTINUED] chlorpheniramine-HYDROcodone (TUSSIONEX) 10-8 MG/5ML Take 5 mLs by mouth every 12 (twelve) hours as needed for cough.   [DISCONTINUED] meloxicam (MOBIC) 15 MG tablet Take one tab po qd prn for arthritis    [DISCONTINUED] valsartan-hydrochlorothiazide (DIOVAN-HCT) 320-12.5 MG tablet TAKE 1 TABLET BY MOUTH DAILY FOR HIGH BLOOD PRESSURE   amLODipine (NORVASC) 5 MG tablet Take 1 tablet (5 mg total) by mouth daily.   atorvastatin (LIPITOR) 10 MG tablet TAKE 1 TABLET(10 MG) BY MOUTH AT BEDTIME   meloxicam (MOBIC) 15 MG tablet Take one tab po qd prn for arthritis   valsartan-hydrochlorothiazide (DIOVAN-HCT) 320-12.5 MG tablet Take 1 tablet by mouth daily. for high blood pressure   No facility-administered encounter medications on file as of 04/15/2023.    Surgical History: Past Surgical History:  Procedure Laterality Date   APPENDECTOMY  1955   BUNIONECTOMY  1995   and osteotomy   COLONOSCOPY  2017   CYSTOSCOPY  02/22/2012   Procedure: CYSTOSCOPY;  Surgeon: Garnett Farm, MD;  Location: [facility];  Service: Urology;  Laterality: Right;  removal of ureteral stent   FLEXIBLE SIGMOIDOSCOPY  08/12/2012   Procedure: FLEXIBLE SIGMOIDOSCOPY;  Surgeon: Louis Meckel, MD;  Location: [facility];  Service: Endoscopy;  Laterality: N/A;   NEPHROLITHOTOMY  01/28/2012-  right ureteral stent placement   Procedure: NEPHROLITHOTOMY PERCUTANEOUS;  Surgeon: Garnett Farm, MD;  Location: [facility];  Service: Urology;  Laterality: Right;   OTHER SURGICAL HISTORY  01/28/12   right percutaneous nephroliathothoma   REFRACTIVE SURGERY  yrs ago   ROTATOR CUFF REPAIR  1997 and 2006   right   URETEROSCOPY  02/22/2012   Procedure: URETEROSCOPY;  Surgeon: Garnett Farm, MD;  Location: [facility];  Service: Urology;  Laterality: Right;  URETEROSCOPY WITH STONE EXTRACTION    VAGINAL HYSTERECTOMY  1985    Medical History: Past Medical History:  Diagnosis Date   Adenomatous colon polyp 07/03/2012   Diverticulosis    Frequency of urination    History of kidney stones    Hyperlipidemia    Hypertension    Internal hemorrhoids    Mitral valve prolapse per dr wall note 09/12--  stable   PAC  (premature atrial contraction) followed by cardiologist --  dr wall--  last visit  sept 12  note in epic--  states stable   Palpitations hx pac's-   occasional   Renal calculus, right followed by dr Vernie Ammons   February 2013   Right shoulder pain Dec 07, 2011 fall   pain at times with positioning   Urgency of micturation    Vertigo, intermittent     Family History: Family History  Problem Relation Age of Onset   Liver cancer Mother    Heart attack Sister    Peripheral vascular disease Sister    Colon cancer Neg Hx    Breast cancer Neg Hx     Social History   Socioeconomic History   Marital status: Married    Spouse name: Not on file   Number of children: 3   Years of education: Not on file   Highest education level: Not on file  Occupational History   Occupation: Housewife  Tobacco Use   Smoking status: Former    Packs/day: 1.00    Years: 15.00    Additional pack years: 0.00    Total pack years: 15.00    Types: Cigarettes    Quit date: 12/04/1975    Years since quitting: 47.3   Smokeless tobacco: Never  Vaping Use   Vaping Use: Never used  Substance and Sexual Activity   Alcohol use: Not Currently    Alcohol/week: 7.0 standard drinks of alcohol    Types: 7 Glasses of wine per week    Comment: 2-4 oz several times per week   Drug use: No   Sexual activity: Not on file  Other Topics Concern   Not on file  Social History Narrative   Married.   Gets regular exercise.   Caffeine daily. 3 cups of coffee or soda per day   Social Determinants of Health   Financial Resource Strain: Not on file  Food Insecurity: Not on file  Transportation Needs: Not on file  Physical Activity: Not on file  Stress: Not on file  Social Connections: Not on file  Intimate Partner Violence: Not on file      Review of Systems  Constitutional:  Negative for chills, fatigue and unexpected weight change.  HENT:  Negative for congestion, rhinorrhea, sneezing and sore throat.   Eyes:   Negative for redness.  Respiratory: Negative.  Negative for cough, chest tightness, shortness of breath and wheezing.   Cardiovascular: Negative.  Negative for chest pain and palpitations.  Gastrointestinal:  Negative for abdominal pain, constipation, diarrhea, nausea and vomiting.  Genitourinary:  Negative for dysuria and frequency.  Musculoskeletal:  Negative for arthralgias, back pain, joint swelling and neck pain.  Skin:  Negative for rash.  Neurological:  Positive for dizziness. Negative for tremors and numbness.  Hematological:  Negative for adenopathy. Does not bruise/bleed easily.  Psychiatric/Behavioral:  Negative for behavioral problems (Depression), sleep disturbance and suicidal ideas. The patient is not nervous/anxious.     Vital Signs: BP 138/80 Comment: 150/82  Pulse 76   Temp 97.9 F (  36.6 C)   Resp 16   Ht 5' 2.5" (1.588 m)   Wt 111 lb 9.6 oz (50.6 kg)   SpO2 99%   BMI 20.09 kg/m    Physical Exam Vitals reviewed.  Constitutional:      General: She is not in acute distress.    Appearance: Normal appearance. She is normal weight. She is not ill-appearing.  HENT:     Head: Normocephalic and atraumatic.  Eyes:     Pupils: Pupils are equal, round, and reactive to light.  Cardiovascular:     Rate and Rhythm: Normal rate and regular rhythm.  Neurological:     Mental Status: She is alert and oriented to person, place, and time.     Cranial Nerves: No cranial nerve deficit.     Coordination: Coordination normal.     Gait: Gait normal.  Psychiatric:        Mood and Affect: Mood normal.        Behavior: Behavior normal.       Assessment/Plan: 1. Essential hypertension Stable, Continue amlodipine and valsartan-HCTZ as prescribed.  - valsartan-hydrochlorothiazide (DIOVAN-HCT) 320-12.5 MG tablet; Take 1 tablet by mouth daily. for high blood pressure  Dispense: 90 tablet; Refill: 3 - amLODipine (NORVASC) 5 MG tablet; Take 1 tablet (5 mg total) by mouth daily.   Dispense: 90 tablet; Refill: 3  2. Vertigo Will try hydroxyzine as needed for dizziness/vertigo - hydrOXYzine (ATARAX) 25 MG tablet; Take 1 tablet (25 mg total) by mouth 3 (three) times daily as needed (dizziness and vertigo).  Dispense: 60 tablet; Refill: 0  3. Primary osteoarthritis involving multiple joints Continue meloxicam as prescribed.  - meloxicam (MOBIC) 15 MG tablet; Take one tab po qd prn for arthritis  Dispense: 30 tablet; Refill: 3  4. Mixed hyperlipidemia Continue atorvastatin as prescribed.  - atorvastatin (LIPITOR) 10 MG tablet; TAKE 1 TABLET(10 MG) BY MOUTH AT BEDTIME  Dispense: 90 tablet; Refill: 2   General Counseling: Nitara verbalizes understanding of the findings of todays visit and agrees with plan of treatment. I have discussed any further diagnostic evaluation that may be needed or ordered today. We also reviewed her medications today. she has been encouraged to call the office with any questions or concerns that should arise related to todays visit.    No orders of the defined types were placed in this encounter.   Meds ordered this encounter  Medications   valsartan-hydrochlorothiazide (DIOVAN-HCT) 320-12.5 MG tablet    Sig: Take 1 tablet by mouth daily. for high blood pressure    Dispense:  90 tablet    Refill:  3    For future refills, please put on file   meloxicam (MOBIC) 15 MG tablet    Sig: Take one tab po qd prn for arthritis    Dispense:  30 tablet    Refill:  3    Do not fill, patient will call if she needs it   hydrOXYzine (ATARAX) 25 MG tablet    Sig: Take 1 tablet (25 mg total) by mouth 3 (three) times daily as needed (dizziness and vertigo).    Dispense:  60 tablet    Refill:  0   atorvastatin (LIPITOR) 10 MG tablet    Sig: TAKE 1 TABLET(10 MG) BY MOUTH AT BEDTIME    Dispense:  90 tablet    Refill:  2    For future refills, please keep on file   amLODipine (NORVASC) 5 MG tablet    Sig: Take 1 tablet (  5 mg total) by mouth daily.     Dispense:  90 tablet    Refill:  3    For future refills, please keep on file.    Return for previously scheduled, AWV, Chloeanne Poteet PCP in november.   Total time spent:30 Minutes Time spent includes review of chart, medications, test results, and follow up plan with the patient.   [place] Controlled Substance Database was reviewed by me.  This patient was seen by Sallyanne Kuster, FNP-C in collaboration with Dr. Beverely Risen as a part of collaborative care agreement.   Keeva Reisen R. Tedd Sias, MSN, FNP-C Internal medicine

## 2023-04-17 ENCOUNTER — Encounter (INDEPENDENT_AMBULATORY_CARE_PROVIDER_SITE_OTHER): Payer: Medicare Other | Admitting: Ophthalmology

## 2023-04-17 DIAGNOSIS — H35033 Hypertensive retinopathy, bilateral: Secondary | ICD-10-CM | POA: Diagnosis not present

## 2023-04-17 DIAGNOSIS — H353221 Exudative age-related macular degeneration, left eye, with active choroidal neovascularization: Secondary | ICD-10-CM

## 2023-04-17 DIAGNOSIS — H353112 Nonexudative age-related macular degeneration, right eye, intermediate dry stage: Secondary | ICD-10-CM | POA: Diagnosis not present

## 2023-04-17 DIAGNOSIS — I10 Essential (primary) hypertension: Secondary | ICD-10-CM | POA: Diagnosis not present

## 2023-04-17 DIAGNOSIS — H43813 Vitreous degeneration, bilateral: Secondary | ICD-10-CM | POA: Diagnosis not present

## 2023-04-22 DIAGNOSIS — H40003 Preglaucoma, unspecified, bilateral: Secondary | ICD-10-CM | POA: Diagnosis not present

## 2023-04-30 ENCOUNTER — Other Ambulatory Visit: Payer: Self-pay | Admitting: Ophthalmology

## 2023-04-30 DIAGNOSIS — Z961 Presence of intraocular lens: Secondary | ICD-10-CM | POA: Diagnosis not present

## 2023-04-30 DIAGNOSIS — H40003 Preglaucoma, unspecified, bilateral: Secondary | ICD-10-CM | POA: Diagnosis not present

## 2023-04-30 DIAGNOSIS — H53461 Homonymous bilateral field defects, right side: Secondary | ICD-10-CM | POA: Diagnosis not present

## 2023-05-06 ENCOUNTER — Ambulatory Visit
Admission: RE | Admit: 2023-05-06 | Discharge: 2023-05-06 | Disposition: A | Discharge status: Home / Self Care | Payer: Medicare Other | Source: Ambulatory Visit | Attending: Ophthalmology | Admitting: Ophthalmology

## 2023-05-06 DIAGNOSIS — H53461 Homonymous bilateral field defects, right side: Secondary | ICD-10-CM | POA: Diagnosis not present

## 2023-05-06 MED ORDER — GADOBUTROL 1 MMOL/ML IV SOLN
5.0000 mL | Freq: Once | INTRAVENOUS | Status: AC | PRN
Start: 2023-05-06 — End: 2023-05-06
  Administered 2023-05-06: 5 mL via INTRAVENOUS

## 2023-06-19 ENCOUNTER — Encounter (INDEPENDENT_AMBULATORY_CARE_PROVIDER_SITE_OTHER): Payer: Medicare Other | Admitting: Ophthalmology

## 2023-06-19 DIAGNOSIS — H35033 Hypertensive retinopathy, bilateral: Secondary | ICD-10-CM | POA: Diagnosis not present

## 2023-06-19 DIAGNOSIS — I10 Essential (primary) hypertension: Secondary | ICD-10-CM | POA: Diagnosis not present

## 2023-06-19 DIAGNOSIS — H43813 Vitreous degeneration, bilateral: Secondary | ICD-10-CM

## 2023-06-19 DIAGNOSIS — H353221 Exudative age-related macular degeneration, left eye, with active choroidal neovascularization: Secondary | ICD-10-CM

## 2023-06-19 DIAGNOSIS — H353112 Nonexudative age-related macular degeneration, right eye, intermediate dry stage: Secondary | ICD-10-CM

## 2023-08-14 ENCOUNTER — Encounter: Payer: Self-pay | Admitting: Nurse Practitioner

## 2023-08-14 ENCOUNTER — Ambulatory Visit (INDEPENDENT_AMBULATORY_CARE_PROVIDER_SITE_OTHER): Payer: Medicare Other | Admitting: Nurse Practitioner

## 2023-08-14 VITALS — BP 134/76 | HR 73 | Temp 98.7°F | Resp 16 | Ht 62.5 in | Wt 113.8 lb

## 2023-08-14 DIAGNOSIS — L989 Disorder of the skin and subcutaneous tissue, unspecified: Secondary | ICD-10-CM | POA: Diagnosis not present

## 2023-08-14 DIAGNOSIS — R7301 Impaired fasting glucose: Secondary | ICD-10-CM | POA: Diagnosis not present

## 2023-08-14 DIAGNOSIS — E782 Mixed hyperlipidemia: Secondary | ICD-10-CM | POA: Diagnosis not present

## 2023-08-14 NOTE — Progress Notes (Signed)
[facility] [address] [place], [place] [address]  Internal MEDICINE  Office Visit Note  Patient Name: Amy Gilmore  [id number]  [id number]  Date of Service: 08/14/2023  Chief Complaint  Patient presents with   Acute Visit    Bruise on top of head.      HPI Amy Gilmore presents for an acute sick visit for scab/skin lesion on scalp.  --onset: noticed about 1 month ago On center top of scalp, scabbed over Most likely actinic keratosis but difficult to see since she scratched it and it scabbed over. She reported that it did bleed a little when she scratched it.   --also has upcoming annual visit, wants to have labs ordered today to have them done prior to next visit.    Current Medication:  Outpatient Encounter Medications as of 08/14/2023  Medication Sig   acetaminophen (TYLENOL) 500 MG tablet Take 1,000 mg by mouth every 6 (six) hours as needed. Pain    amLODipine (NORVASC) 5 MG tablet Take 1 tablet (5 mg total) by mouth daily.   aspirin 81 MG tablet Take 81 mg by mouth daily.   atorvastatin (LIPITOR) 10 MG tablet TAKE 1 TABLET(10 MG) BY MOUTH AT BEDTIME   hydrOXYzine (ATARAX) 25 MG tablet Take 1 tablet (25 mg total) by mouth 3 (three) times daily as needed (dizziness and vertigo).   Hypromellose 0.3 % SOLN Place 1-2 drops into both eyes 2 (two) times daily as needed. Dry eyes    meclizine (ANTIVERT) 25 MG tablet Take 1 tablet (25 mg total) by mouth 3 (three) times daily as needed for dizziness (vertigo).   meloxicam (MOBIC) 15 MG tablet Take one tab po qd prn for arthritis   Multiple Vitamins-Minerals (CENTRUM SILVER) tablet Take 1 tablet by mouth daily.   RESTASIS 0.05 % ophthalmic emulsion INSTILL 1 DROP INTO BOTH EYES TWICE A DAY   trimethoprim-polymyxin b (POLYTRIM) ophthalmic solution SMARTSIG:In Eye(s)   [DISCONTINUED] valsartan-hydrochlorothiazide (DIOVAN-HCT) 320-12.5 MG tablet Take 1 tablet by mouth daily. for high blood pressure   No  facility-administered encounter medications on file as of 08/14/2023.      Medical History: Past Medical History:  Diagnosis Date   Adenomatous colon polyp 07/03/2012   Diverticulosis    Frequency of urination    History of kidney stones    Hyperlipidemia    Hypertension    Internal hemorrhoids    Mitral valve prolapse per dr wall note 09/12--  stable   PAC (premature atrial contraction) followed by cardiologist --  dr wall--  last visit  sept 12  note in epic--  states stable   Palpitations hx pac's-   occasional   Renal calculus, right followed by dr Vernie Ammons   February 2013   Right shoulder pain Dec 07, 2011 fall   pain at times with positioning   Urgency of micturation    Vertigo, intermittent      Vital Signs: BP 134/76   Pulse 73   Temp 98.7 F (37.1 C)   Resp 16   Ht 5' 2.5" (1.588 m)   Wt 113 lb 12.8 oz (51.6 kg)   SpO2 96%   BMI 20.48 kg/m    Review of Systems  Respiratory: Negative.  Negative for cough, chest tightness, shortness of breath and wheezing.   Cardiovascular: Negative.  Negative for chest pain and palpitations.  Skin:        Lesion on top of scalp, scabbed over now     Physical Exam Skin:  Findings: Wound (scabbed over now, possible skin lesion on scalp, unable to fully assess) present.       Assessment/Plan: 1. Skin lesion of scalp Referred to dermatology - Ambulatory referral to Dermatology  2. Mixed hyperlipidemia Routine labs ordered  - CBC with Differential/Platelet - CMP14+EGFR - Lipid Profile - Hgb A1C w/o eAG  3. Impaired fasting glucose Routine labs ordered  - CBC with Differential/Platelet - CMP14+EGFR - Lipid Profile - Hgb A1C w/o eAG   General Counseling: Amy Gilmore verbalizes understanding of the findings of todays visit and agrees with plan of treatment. I have discussed any further diagnostic evaluation that may be needed or ordered today. We also reviewed her medications today. she has been encouraged to call  the office with any questions or concerns that should arise related to todays visit.    Counseling:    Orders Placed This Encounter  Procedures   CBC with Differential/Platelet   CMP14+EGFR   Lipid Profile   Hgb A1C w/o eAG   Ambulatory referral to Dermatology    No orders of the defined types were placed in this encounter.   Return if symptoms worsen or fail to improve.  [place] Controlled Substance Database was reviewed by me for overdose risk score (ORS)  Time spent:30 Minutes Time spent with patient included reviewing progress notes, labs, imaging studies, and discussing plan for follow up.   This patient was seen by Sallyanne Kuster, FNP-C in collaboration with Dr. Beverely Risen as a part of collaborative care agreement.  Dejanira Pamintuan R. Tedd Sias, MSN, FNP-C Internal Medicine

## 2023-08-21 ENCOUNTER — Encounter (INDEPENDENT_AMBULATORY_CARE_PROVIDER_SITE_OTHER): Payer: Medicare Other | Admitting: Ophthalmology

## 2023-08-21 DIAGNOSIS — H353221 Exudative age-related macular degeneration, left eye, with active choroidal neovascularization: Secondary | ICD-10-CM

## 2023-08-21 DIAGNOSIS — H43813 Vitreous degeneration, bilateral: Secondary | ICD-10-CM

## 2023-08-21 DIAGNOSIS — H353112 Nonexudative age-related macular degeneration, right eye, intermediate dry stage: Secondary | ICD-10-CM | POA: Diagnosis not present

## 2023-08-21 DIAGNOSIS — H35033 Hypertensive retinopathy, bilateral: Secondary | ICD-10-CM | POA: Diagnosis not present

## 2023-08-21 DIAGNOSIS — I10 Essential (primary) hypertension: Secondary | ICD-10-CM

## 2023-08-24 ENCOUNTER — Other Ambulatory Visit: Payer: Self-pay | Admitting: Nurse Practitioner

## 2023-08-24 DIAGNOSIS — I10 Essential (primary) hypertension: Secondary | ICD-10-CM

## 2023-10-03 ENCOUNTER — Encounter: Payer: Self-pay | Admitting: Dermatology

## 2023-10-03 ENCOUNTER — Ambulatory Visit (INDEPENDENT_AMBULATORY_CARE_PROVIDER_SITE_OTHER): Payer: Medicare Other | Admitting: Dermatology

## 2023-10-03 DIAGNOSIS — D099 Carcinoma in situ, unspecified: Secondary | ICD-10-CM

## 2023-10-03 DIAGNOSIS — D492 Neoplasm of unspecified behavior of bone, soft tissue, and skin: Secondary | ICD-10-CM | POA: Diagnosis not present

## 2023-10-03 DIAGNOSIS — D044 Carcinoma in situ of skin of scalp and neck: Secondary | ICD-10-CM | POA: Diagnosis not present

## 2023-10-03 DIAGNOSIS — D485 Neoplasm of uncertain behavior of skin: Secondary | ICD-10-CM

## 2023-10-03 HISTORY — DX: Carcinoma in situ, unspecified: D09.9

## 2023-10-03 NOTE — Patient Instructions (Addendum)

## 2023-10-03 NOTE — Progress Notes (Signed)
   New Patient Visit   Subjective  Amy Gilmore is a 87 y.o. female who presents for the following: Growth on the scalp, came up about 2 months ago, growing fast. Not itchy or painful, no bleeding. No history of skin cancer.   Patient accompanied by husband.  The following portions of the chart were reviewed this encounter and updated as appropriate: medications, allergies, medical history  Review of Systems:  No other skin or systemic complaints except as noted in HPI or Assessment and Plan.  Objective  Well appearing patient in no apparent distress; mood and affect are within normal limits.  A focused examination was performed of the following areas: Face, scalp  Relevant physical exam findings are noted in the Assessment and Plan.  Frontal scalp within hair part 1.0 cm keratotic papule            Assessment & Plan   Neoplasm of uncertain behavior of skin Frontal scalp within hair part  Skin / nail biopsy Type of biopsy: tangential   Informed consent: discussed and consent obtained   Timeout: patient name, date of birth, surgical site, and procedure verified   Procedure prep:  Patient was prepped and draped in usual sterile fashion Prep type:  Isopropyl alcohol Anesthesia: the lesion was anesthetized in a standard fashion   Anesthetic:  1% lidocaine w/ epinephrine 1-100,000 buffered w/ 8.4% NaHCO3 Instrument used: DermaBlade   Hemostasis achieved with: pressure and aluminum chloride   Outcome: patient tolerated procedure well   Post-procedure details: sterile dressing applied and wound care instructions given   Dressing type: petrolatum    Specimen 1 - Surgical pathology Differential Diagnosis: r/o SCC  Check Margins: No  r/o SCC    No follow-ups on file.  Wendee Beavers, CMA, am acting as scribe for Elie Goody, MD .   Documentation: I have reviewed the above documentation for accuracy and completeness, and I agree with the  above.  Elie Goody, MD

## 2023-10-07 ENCOUNTER — Encounter: Payer: Self-pay | Admitting: Nurse Practitioner

## 2023-10-08 LAB — SURGICAL PATHOLOGY

## 2023-10-09 ENCOUNTER — Telehealth: Payer: Self-pay

## 2023-10-09 DIAGNOSIS — D099 Carcinoma in situ, unspecified: Secondary | ICD-10-CM

## 2023-10-09 NOTE — Telephone Encounter (Signed)
-----   Message from [place] sent at 10/09/2023  1:54 PM EST ----- Diagnosis frontal scalp within hair part :       SQUAMOUS CELL CARCINOMA IN SITU, HYPERTROPHIC, BASE INVOLVED     Please call with diagnosis and message me with patient's decision on treatment.   Explanation: This is a squamous cell skin cancer limited to the top layer of skin. This means it is an early cancer and has not spread. However, it has the potential to spread beyond the skin and threaten your health, so we recommend treating it.   Treatment option 1: a cream (fluorouracil and calcipotriene) that helps your immune system clear the skin cancer. It will cause redness and irritation. Wait two weeks after the biopsy to start applying the cream. Apply the cream twice per day until the redness and irritation develop (usually occurs by day 7), then stop and allow it to heal. We will recheck the area in 2 months to ensure the cancer is gone. The cream is $35 plus shipping and will be mailed to you from a low cost compounding pharmacy.   Treatment option 2: Mohs surgery, which involves cutting out right around the skin cancer and then checking under the microscope on the same day to ensure the whole skin cancer is out. If there is more cancer remaining, the surgeon will repeat the process until it is fully cleared. The cure rate is about 98-99%. It is done at another office outside of [place] ([place], [place], or [place]). Once the Mohs surgeon confirms the skin cancer is out, they will discuss the options to repair or heal the area. You must take it easy for about two weeks after surgery (no lifting over 10-15 lbs, avoid activity to get your heart rate and blood pressure up).

## 2023-10-09 NOTE — Telephone Encounter (Signed)
Patient advised pathology showed SCCis, patient prefers Mohs. Referral sent to Dr. Caralyn Guile. Advised patient to let us know if they have not heard from their office in 1 week. Butch Penny., RMA

## 2023-10-10 DIAGNOSIS — R7301 Impaired fasting glucose: Secondary | ICD-10-CM | POA: Diagnosis not present

## 2023-10-10 DIAGNOSIS — E782 Mixed hyperlipidemia: Secondary | ICD-10-CM | POA: Diagnosis not present

## 2023-10-11 LAB — LIPID PANEL
Chol/HDL Ratio: 2.7 {ratio} (ref 0.0–4.4)
Cholesterol, Total: 239 mg/dL — ABNORMAL HIGH (ref 100–199)
HDL: 87 mg/dL (ref >39)
LDL Chol Calc (NIH): 139 mg/dL — ABNORMAL HIGH (ref 0–99)
Triglycerides: 77 mg/dL (ref 0–149)
VLDL Cholesterol Cal: 13 mg/dL (ref 5–40)

## 2023-10-11 LAB — CMP14+EGFR
ALT: 20 [IU]/L (ref 0–32)
AST: 22 [IU]/L (ref 0–40)
Albumin: 4.4 g/dL (ref 3.7–4.7)
Alkaline Phosphatase: 73 [IU]/L (ref 44–121)
BUN/Creatinine Ratio: 24 (ref 12–28)
BUN: 16 mg/dL (ref 8–27)
Bilirubin Total: 0.6 mg/dL (ref 0.0–1.2)
CO2: 26 mmol/L (ref 20–29)
Calcium: 9.2 mg/dL (ref 8.7–10.3)
Chloride: 102 mmol/L (ref 96–106)
Creatinine, Ser: 0.66 mg/dL (ref 0.57–1.00)
Globulin, Total: 1.9 g/dL (ref 1.5–4.5)
Glucose: 103 mg/dL — ABNORMAL HIGH (ref 70–99)
Potassium: 3.6 mmol/L (ref 3.5–5.2)
Sodium: 142 mmol/L (ref 134–144)
Total Protein: 6.3 g/dL (ref 6.0–8.5)
eGFR: 86 mL/min/{1.73_m2} (ref >59)

## 2023-10-11 LAB — CBC WITH DIFFERENTIAL/PLATELET
Basophils Absolute: 0.1 10*3/uL (ref 0.0–0.2)
Basos: 1 %
EOS (ABSOLUTE): 0.1 10*3/uL (ref 0.0–0.4)
Eos: 2 %
Hematocrit: 40 % (ref 34.0–46.6)
Hemoglobin: 13.8 g/dL (ref 11.1–15.9)
Immature Grans (Abs): 0 10*3/uL (ref 0.0–0.1)
Immature Granulocytes: 0 %
Lymphocytes Absolute: 1.4 10*3/uL (ref 0.7–3.1)
Lymphs: 28 %
MCH: 34.3 pg — ABNORMAL HIGH (ref 26.6–33.0)
MCHC: 34.5 g/dL (ref 31.5–35.7)
MCV: 100 fL — ABNORMAL HIGH (ref 79–97)
Monocytes Absolute: 0.4 10*3/uL (ref 0.1–0.9)
Monocytes: 9 %
Neutrophils Absolute: 3 10*3/uL (ref 1.4–7.0)
Neutrophils: 60 %
Platelets: 331 10*3/uL (ref 150–450)
RBC: 4.02 x10E6/uL (ref 3.77–5.28)
RDW: 12.2 % (ref 11.7–15.4)
WBC: 5 10*3/uL (ref 3.4–10.8)

## 2023-10-11 LAB — HGB A1C W/O EAG: Hgb A1c MFr Bld: 5.7 % — ABNORMAL HIGH (ref 4.8–5.6)

## 2023-10-12 NOTE — Progress Notes (Signed)
Will discuss at next visit.  

## 2023-10-21 ENCOUNTER — Encounter: Payer: Self-pay | Admitting: Internal Medicine

## 2023-10-21 ENCOUNTER — Ambulatory Visit (INDEPENDENT_AMBULATORY_CARE_PROVIDER_SITE_OTHER): Payer: Medicare Other | Admitting: Internal Medicine

## 2023-10-21 VITALS — BP 128/75 | HR 66 | Temp 97.8°F | Resp 16 | Ht 62.5 in | Wt 114.4 lb

## 2023-10-21 DIAGNOSIS — E782 Mixed hyperlipidemia: Secondary | ICD-10-CM

## 2023-10-21 DIAGNOSIS — R419 Unspecified symptoms and signs involving cognitive functions and awareness: Secondary | ICD-10-CM

## 2023-10-21 DIAGNOSIS — Z1231 Encounter for screening mammogram for malignant neoplasm of breast: Secondary | ICD-10-CM | POA: Diagnosis not present

## 2023-10-21 DIAGNOSIS — Z0001 Encounter for general adult medical examination with abnormal findings: Secondary | ICD-10-CM | POA: Diagnosis not present

## 2023-10-21 DIAGNOSIS — F321 Major depressive disorder, single episode, moderate: Secondary | ICD-10-CM

## 2023-10-21 MED ORDER — ESCITALOPRAM OXALATE 10 MG PO TABS
ORAL_TABLET | ORAL | 3 refills | Status: DC
Start: 2023-10-21 — End: 2023-12-23

## 2023-10-21 NOTE — Progress Notes (Signed)
[facility] [address] [place], [place] [address]  Internal MEDICINE  Office Visit Note  Patient Name: Amy Gilmore  [id number]  [id number]  Date of Service: 10/21/2023  Chief Complaint  Patient presents with   Medicare Wellness   Hyperlipidemia   Hypertension   Quality Metric Gaps    Flu Vaccine and Colonoscopy     HPI Pt is here for routine health maintenance examination Has been having memory loss, has been seen by neurology and was started on Aricept 10 mg once a day According to her husband who is in the room today has not seen any improvement  They recently moved to assisted living  Patient seems to be a little upset about the situation, has word finding difficulty  Depression PHQ-9  Current Medication: Outpatient Encounter Medications as of 10/21/2023  Medication Sig   acetaminophen (TYLENOL) 500 MG tablet Take 1,000 mg by mouth every 6 (six) hours as needed. Pain    amLODipine (NORVASC) 5 MG tablet Take 1 tablet (5 mg total) by mouth daily.   aspirin 81 MG tablet Take 81 mg by mouth daily.   atorvastatin (LIPITOR) 10 MG tablet TAKE 1 TABLET(10 MG) BY MOUTH AT BEDTIME   escitalopram (LEXAPRO) 10 MG tablet Take one tab po every day for depression   hydrOXYzine (ATARAX) 25 MG tablet Take 1 tablet (25 mg total) by mouth 3 (three) times daily as needed (dizziness and vertigo).   Hypromellose 0.3 % SOLN Place 1-2 drops into both eyes 2 (two) times daily as needed. Dry eyes    meclizine (ANTIVERT) 25 MG tablet Take 1 tablet (25 mg total) by mouth 3 (three) times daily as needed for dizziness (vertigo).   meloxicam (MOBIC) 15 MG tablet Take one tab po qd prn for arthritis   Multiple Vitamins-Minerals (CENTRUM SILVER) tablet Take 1 tablet by mouth daily.   RESTASIS 0.05 % ophthalmic emulsion INSTILL 1 DROP INTO BOTH EYES TWICE A DAY   trimethoprim-polymyxin b (POLYTRIM) ophthalmic solution SMARTSIG:In Eye(s)   valsartan-hydrochlorothiazide  (DIOVAN-HCT) 320-12.5 MG tablet TAKE 1 TABLET BY MOUTH DAILY FOR HIGH BLOOD PRESSURE   [DISCONTINUED] amLODipine (NORVASC) 5 MG tablet Take 1 tablet (5 mg total) by mouth daily.   [DISCONTINUED] atorvastatin (LIPITOR) 10 MG tablet TAKE 1 TABLET(10 MG) BY MOUTH AT BEDTIME   [DISCONTINUED] chlorpheniramine-HYDROcodone (TUSSIONEX) 10-8 MG/5ML Take 5 mLs by mouth every 12 (twelve) hours as needed for cough.   [DISCONTINUED] meloxicam (MOBIC) 15 MG tablet Take one tab po qd prn for arthritis   [DISCONTINUED] valsartan-hydrochlorothiazide (DIOVAN-HCT) 320-12.5 MG tablet TAKE 1 TABLET BY MOUTH DAILY FOR HIGH BLOOD PRESSURE   No facility-administered encounter medications on file as of 10/21/2023.    Surgical History: Past Surgical History:  Procedure Laterality Date   APPENDECTOMY  1955   BUNIONECTOMY  1995   and osteotomy   COLONOSCOPY  2017   CYSTOSCOPY  02/22/2012   Procedure: CYSTOSCOPY;  Surgeon: Garnett Farm, MD;  Location: [facility];  Service: Urology;  Laterality: Right;  removal of ureteral stent   FLEXIBLE SIGMOIDOSCOPY  08/12/2012   Procedure: FLEXIBLE SIGMOIDOSCOPY;  Surgeon: Louis Meckel, MD;  Location: [facility];  Service: Endoscopy;  Laterality: N/A;   NEPHROLITHOTOMY  01/28/2012-  right ureteral stent placement   Procedure: NEPHROLITHOTOMY PERCUTANEOUS;  Surgeon: Garnett Farm, MD;  Location: [facility];  Service: Urology;  Laterality: Right;   OTHER SURGICAL HISTORY  01/28/12   right percutaneous nephroliathothoma   REFRACTIVE SURGERY  yrs ago   ROTATOR CUFF REPAIR  1997 and 2006   right   URETEROSCOPY  02/22/2012   Procedure: URETEROSCOPY;  Surgeon: Garnett Farm, MD;  Location: [facility];  Service: Urology;  Laterality: Right;  URETEROSCOPY WITH STONE EXTRACTION    VAGINAL HYSTERECTOMY  1985    Medical History: Past Medical History:  Diagnosis Date   Adenomatous colon polyp 07/03/2012   Diverticulosis    Frequency of urination     History of kidney stones    Hyperlipidemia    Hypertension    Internal hemorrhoids    Mitral valve prolapse per dr wall note 09/12--  stable   PAC (premature atrial contraction) followed by cardiologist --  dr wall--  last visit  sept 12  note in epic--  states stable   Palpitations hx pac's-   occasional   Renal calculus, right followed by dr Vernie Ammons   February 2013   Right shoulder pain Dec 07, 2011 fall   pain at times with positioning   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   frontal scalp within hair part, Mohs   Urgency of micturation    Vertigo, intermittent     Family History: Family History  Problem Relation Age of Onset   Liver cancer Mother    Heart attack Sister    Peripheral vascular disease Sister    Colon cancer Neg Hx    Breast cancer Neg Hx     Social History: Social History   Socioeconomic History   Marital status: Married    Spouse name: Not on file   Number of children: 3   Years of education: Not on file   Highest education level: Not on file  Occupational History   Occupation: Housewife  Tobacco Use   Smoking status: Former    Current packs/day: 0.00    Average packs/day: 1 pack/day for 15.0 years (15.0 ttl pk-yrs)    Types: Cigarettes    Start date: 12/03/1960    Quit date: 12/04/1975    Years since quitting: 47.9   Smokeless tobacco: Never  Vaping Use   Vaping status: Never Used  Substance and Sexual Activity   Alcohol use: Not Currently    Alcohol/week: 7.0 standard drinks of alcohol    Types: 7 Glasses of wine per week    Comment: 2-4 oz several times per week   Drug use: No   Sexual activity: Not on file  Other Topics Concern   Not on file  Social History Narrative   Married.   Gets regular exercise.   Caffeine daily. 3 cups of coffee or soda per day   Social Determinants of Health   Financial Resource Strain: Not on file  Food Insecurity: Not on file  Transportation Needs: Not on file  Physical Activity: Not on  file  Stress: Not on file  Social Connections: Not on file      Review of Systems  Constitutional:  Negative for chills, fatigue and unexpected weight change.  HENT:  Negative for congestion, postnasal drip, rhinorrhea, sneezing and sore throat.   Eyes:  Negative for redness.  Respiratory:  Negative for cough, chest tightness and shortness of breath.   Cardiovascular:  Negative for chest pain and palpitations.  Gastrointestinal:  Negative for abdominal pain, constipation, diarrhea, nausea and vomiting.  Genitourinary:  Negative for dysuria and frequency.  Musculoskeletal:  Negative for arthralgias, back pain, joint swelling and neck pain.  Skin:  Negative for rash.  Neurological: Negative.  Negative for tremors and numbness.  Hematological:  Negative for adenopathy. Does not bruise/bleed easily.  Psychiatric/Behavioral:  Positive for dysphoric mood. Negative for behavioral problems (Depression) and sleep disturbance. The patient is not nervous/anxious.      Vital Signs: BP 128/75   Pulse 66   Temp 97.8 F (36.6 C)   Resp 16   Ht 5' 2.5" (1.588 m)   Wt 114 lb 6.4 oz (51.9 kg)   SpO2 96%   BMI 20.59 kg/m      10/21/2023    8:53 AM 10/15/2022    9:00 AM 10/10/2021   10:49 AM 10/18/2020   11:00 AM 10/13/2019    9:52 AM 09/09/2018   10:46 AM  MMSE - Mini Mental State Exam  Orientation to time 3 0 5 5 5 5   Orientation to Place 3 5 5 5 5 5   Registration 2 3 3 3 3 3   Attention/ Calculation 3 5 5 5 5 5   Recall 2 3 3 3 3 3   Language- name 2 objects 2 2 0 2 2 2   Language- repeat 1 1 1 1 1 1   Language- follow 3 step command 3 3 3 3 3 3   Language- read & follow direction 1 1 1 1 1 1   Write a sentence 1 0 1 0 1 1  Copy design 1 1 1 1 1 1   Total score 22 24 28 29 30 30         Physical Exam Constitutional:      Appearance: Normal appearance.  HENT:     Head: Normocephalic and atraumatic.     Nose: Nose normal.     Mouth/Throat:     Mouth: Mucous membranes are moist.      Pharynx: No posterior oropharyngeal erythema.  Eyes:     Extraocular Movements: Extraocular movements intact.     Pupils: Pupils are equal, round, and reactive to light.  Cardiovascular:     Pulses: Normal pulses.     Heart sounds: Normal heart sounds.  Pulmonary:     Effort: Pulmonary effort is normal.     Breath sounds: Normal breath sounds.  Neurological:     General: No focal deficit present.     Mental Status: She is alert.  Psychiatric:        Mood and Affect: Mood normal.        Behavior: Behavior normal.      LABS: Recent Results (from the past 2160 hour(s))  Surgical pathology     Status: None   Collection Time: 10/03/23 12:00 AM  Result Value Ref Range   SURGICAL PATHOLOGY      SURGICAL PATHOLOGY [facility] [address], [address] [place], [place] [address] Telephone [phone] or [phone] Fax [phone]  REPORT OF DERMATOPATHOLOGY   Accession #: [id number] Patient Name: Amy Gilmore, Amy Gilmore Visit # : [id number]  MRN: [id number] Cytotechnologist: Munoz-Bishop Md, Efraim Kaufmann, Dermatopathologist, Electronic Signature DOB/Age [birth year] (Age: 36) Gender: F Collected Date: 10/03/2023 Received Date: 10/03/2023  FINAL DIAGNOSIS       1. Skin, frontal scalp within hair part :       SQUAMOUS CELL CARCINOMA IN SITU, HYPERTROPHIC, BASE INVOLVED       DATE SIGNED OUT: 10/08/2023 ELECTRONIC SIGNATURE : Munoz-Bishop Md, Melissa, Dermatopathologist, Electronic Signature  MICROSCOPIC DESCRIPTION 1. The epidermis is markedly acanthotic with full thickness squamous atypia. This is a hypertrophic squamous cell carcinoma in situ. These changes extend to the base of the biopsy.  In  addition, there is marked hyperke ratosis and parakeratosis overlying the lesion, which correlates with the clinical finding of a cutaneous horn.  CASE COMMENTS STAINS USED IN DIAGNOSIS: H&E H&E    CLINICAL HISTORY  SPECIMEN(S) OBTAINED 1.  Skin, Frontal Scalp Within Hair Part  SPECIMEN COMMENTS: 1. 1.0 cm keratotic papule SPECIMEN CLINICAL INFORMATION: 1. Neoplasm of uncertain behavior of skin, R/O SCC    Gross Description 1. Formalin fixed specimen received:  12 X 8 X 7 MM, TOTO (5 P) (2 B) ( AE )      A=3      B=2        Report signed out from the following location(s) [place]. [facility] [address] [place], [place] [address] CLIA #: [id number]  [facility] [address] [place], [place] [address] CLIA #: [id number]   CBC with Differential/Platelet     Status: Abnormal   Collection Time: 10/10/23  7:42 AM  Result Value Ref Range   WBC 5.0 3.4 - 10.8 x10E3/uL   RBC 4.02 3.77 - 5.28 x10E6/uL   Hemoglobin 13.8 11.1 - 15.9 g/dL   Hematocrit 29.5 28.4 - 46.6 %   MCV 100 (H) 79 - 97 fL   MCH 34.3 (H) 26.6 - 33.0 pg   MCHC 34.5 31.5 - 35.7 g/dL   RDW 13.2 44.0 - 10.2 %   Platelets 331 150 - 450 x10E3/uL   Neutrophils 60 Not Estab. %   Lymphs 28 Not Estab. %   Monocytes 9 Not Estab. %   Eos 2 Not Estab. %   Basos 1 Not Estab. %   Neutrophils Absolute 3.0 1.4 - 7.0 x10E3/uL   Lymphocytes Absolute 1.4 0.7 - 3.1 x10E3/uL   Monocytes Absolute 0.4 0.1 - 0.9 x10E3/uL   EOS (ABSOLUTE) 0.1 0.0 - 0.4 x10E3/uL   Basophils Absolute 0.1 0.0 - 0.2 x10E3/uL   Immature Granulocytes 0 Not Estab. %   Immature Grans (Abs) 0.0 0.0 - 0.1 x10E3/uL  CMP14+EGFR     Status: Abnormal   Collection Time: 10/10/23  7:42 AM  Result Value Ref Range   Glucose 103 (H) 70 - 99 mg/dL   BUN 16 8 - 27 mg/dL   Creatinine, Ser 7.25 0.57 - 1.00 mg/dL   eGFR 86 >36 UY/QIH/4.74   BUN/Creatinine Ratio 24 12 - 28   Sodium 142 134 - 144 mmol/L   Potassium 3.6 3.5 - 5.2 mmol/L   Chloride 102 96 - 106 mmol/L   CO2 26 20 - 29 mmol/L   Calcium 9.2 8.7 - 10.3 mg/dL   Total Protein 6.3 6.0 - 8.5 g/dL   Albumin 4.4 3.7 - 4.7 g/dL   Globulin, Total 1.9 1.5 - 4.5 g/dL   Bilirubin Total 0.6 0.0 - 1.2 mg/dL    Alkaline Phosphatase 73 44 - 121 IU/L   AST 22 0 - 40 IU/L   ALT 20 0 - 32 IU/L  Lipid Profile     Status: Abnormal   Collection Time: 10/10/23  7:42 AM  Result Value Ref Range   Cholesterol, Total 239 (H) 100 - 199 mg/dL   Triglycerides 77 0 - 149 mg/dL   HDL 87 >25 mg/dL   VLDL Cholesterol Cal 13 5 - 40 mg/dL   LDL Chol Calc (NIH) 956 (H) 0 - 99 mg/dL   Chol/HDL Ratio 2.7 0.0 - 4.4 ratio    Comment:  T. Chol/HDL Ratio                                             Men  Women                               1/2 Avg.Risk  3.4    3.3                                   Avg.Risk  5.0    4.4                                2X Avg.Risk  9.6    7.1                                3X Avg.Risk 23.4   11.0   Hgb A1C w/o eAG     Status: Abnormal   Collection Time: 10/10/23  7:42 AM  Result Value Ref Range   Hgb A1c MFr Bld 5.7 (H) 4.8 - 5.6 %    Comment:          Prediabetes: 5.7 - 6.4          Diabetes: >6.4          Glycemic control for adults with diabetes: <7.0      Assessment/Plan: 1. Encounter for general adult medical examination with abnormal findings All PHM is updated   2. Visit for screening mammogram - MM 3D SCREENING MAMMOGRAM BILATERAL BREAST; Future  3. Depression, major, single episode, moderate (HCC) Will start on Lexapro, will reach out to neurology for sooner app  - escitalopram (LEXAPRO) 10 MG tablet; Take one tab po every day for depression  Dispense: 30 tablet; Refill: 3    10/21/2023   12:17 PM 10/21/2023    8:53 AM 04/15/2023    9:52 AM 10/15/2022    8:59 AM 04/10/2022   10:39 AM  Depression screen PHQ 2/9  Decreased Interest 2 0 0 0 0  Down, Depressed, Hopeless 2 0 0 0 0  PHQ - 2 Score 4 0 0 0 0  Altered sleeping 0      Tired, decreased energy 1      Change in appetite 1      Feeling bad or failure about yourself  2      Moving slowly or fidgety/restless 1      Suicidal thoughts 1      PHQ-9 Score 10        4.  Neurocognitive disorder Pt is on aricept 10 mg, might need namenda addition  5. Mixed hyperlipidemia Controlled with medications    General Counseling: Kavya verbalizes understanding of the findings of todays visit and agrees with plan of treatment. I have discussed any further diagnostic evaluation that may be needed or ordered today. We also reviewed her medications today. she has been encouraged to call the office with any questions or concerns that should arise related to todays visit.    Counseling:  [place] Controlled Substance Database was reviewed by me.  Orders Placed This Encounter  Procedures   MM 3D SCREENING MAMMOGRAM  BILATERAL BREAST    Meds ordered this encounter  Medications   escitalopram (LEXAPRO) 10 MG tablet    Sig: Take one tab po every day for depression    Dispense:  30 tablet    Refill:  3    Total time spent:45 Minutes  Time spent includes review of chart, medications, test results, and follow up plan with the patient.     Lyndon Code, MD  Internal Medicine

## 2023-10-23 ENCOUNTER — Encounter (INDEPENDENT_AMBULATORY_CARE_PROVIDER_SITE_OTHER): Payer: Medicare Other | Admitting: Ophthalmology

## 2023-10-23 DIAGNOSIS — H35033 Hypertensive retinopathy, bilateral: Secondary | ICD-10-CM | POA: Diagnosis not present

## 2023-10-23 DIAGNOSIS — I10 Essential (primary) hypertension: Secondary | ICD-10-CM | POA: Diagnosis not present

## 2023-10-23 DIAGNOSIS — H43813 Vitreous degeneration, bilateral: Secondary | ICD-10-CM

## 2023-10-23 DIAGNOSIS — H353111 Nonexudative age-related macular degeneration, right eye, early dry stage: Secondary | ICD-10-CM | POA: Diagnosis not present

## 2023-10-23 DIAGNOSIS — H353221 Exudative age-related macular degeneration, left eye, with active choroidal neovascularization: Secondary | ICD-10-CM | POA: Diagnosis not present

## 2023-10-30 ENCOUNTER — Encounter: Payer: Self-pay | Admitting: Dermatology

## 2023-10-30 ENCOUNTER — Ambulatory Visit: Payer: Medicare Other | Admitting: Dermatology

## 2023-10-30 VITALS — BP 123/76 | HR 75 | Temp 98.4°F

## 2023-10-30 DIAGNOSIS — L814 Other melanin hyperpigmentation: Secondary | ICD-10-CM | POA: Diagnosis not present

## 2023-10-30 DIAGNOSIS — D044 Carcinoma in situ of skin of scalp and neck: Secondary | ICD-10-CM | POA: Diagnosis not present

## 2023-10-30 DIAGNOSIS — L579 Skin changes due to chronic exposure to nonionizing radiation, unspecified: Secondary | ICD-10-CM | POA: Diagnosis not present

## 2023-10-30 DIAGNOSIS — G8918 Other acute postprocedural pain: Secondary | ICD-10-CM | POA: Diagnosis not present

## 2023-10-30 DIAGNOSIS — C4492 Squamous cell carcinoma of skin, unspecified: Secondary | ICD-10-CM

## 2023-10-30 MED ORDER — TRAMADOL HCL 50 MG PO TABS: 50.0000 mg | ORAL_TABLET | Freq: Four times a day (QID) | ORAL | 0 refills | Status: DC | PRN

## 2023-10-30 NOTE — Progress Notes (Signed)
Follow-Up Visit   Subjective  Amy Gilmore is a 87 y.o. female who presents for the following: Mohs of a SCCIS on the frontal scalp within the hair part.  The following portions of the chart were reviewed this encounter and updated as appropriate: medications, allergies, medical history  Review of Systems:  No other skin or systemic complaints except as noted in HPI or Assessment and Plan.  Objective  Well appearing patient in no apparent distress; mood and affect are within normal limits.  A focused examination was performed of the following areas:  Right frontal scalp  Relevant physical exam findings are noted in the Assessment and Plan.   Frontal Scalp within hair part Hyperkeratotic crusting on mid frontal scalp          Assessment & Plan   Squamous cell carcinoma of skin Frontal Scalp within hair part  Mohs surgery  Consent obtained: written  Anticoagulation: Is the patient taking prescription anticoagulant and/or aspirin prescribed/recommended by a physician? Yes   Was the anticoagulation regimen changed prior to Mohs? No    Procedure Details: Timeout: pre-procedure verification complete Procedure Prep: patient was prepped and draped in usual sterile fashion Prep type: chlorhexidine Biopsy accession number: [id number] Biopsy lab: [facility] path Date of biopsy: 10/03/2023 Frozen section biopsy performed: Yes   Specimen debulked: Yes   Pre-Op diagnosis: squamous cell carcinoma SCC subtype: in situ MohsAIQ Surgical site (if tumor spans multiple areas, please select predominant area): scalp Surgery side: midline Surgical site (from skin exam): Frontal Scalp within hair part Pre-operative length (cm): 1.7 Pre-operative width (cm): 1.2 Indications for Mohs surgery: anatomic location where tissue conservation is critical Previously treated? No    Micrographic Surgery Details: Post-operative length (cm): 2.1 Post-operative width (cm):  1.8 Number of Mohs stages: 1 Is this a complex case (associate members only): No    Stage 1    Tumor features identified on Mohs section: no tumor identified    Depth of defect after stage: dermis    Perineural invasion: no perineural invasion  Patient tolerance of procedure: tolerated well, no immediate complications  Reconstruction: Was the defect reconstructed? Yes   Was reconstruction performed by the same Mohs surgeon? Yes   Setting of reconstruction: outpatient office When was reconstruction performed? same day Type of reconstruction: linear Linear reconstruction: complex Length of linear repair (cm): 6  Opioids: Did the patient recieve a prescription for opioid/narcotic related to Mohs surgery? Yes   Indications for opioid/narcotics: patient required additional pain relief despite trial of non-opioid analgesia  Antibiotics: Does patient meet AHA guidelines for endocarditis?: No   Does patient meet AHA guidelines for orthopedic prophylaxis?: No   Were antibiotics given on the day of surgery?: No   Did surgery breach mucosa, expose cartilage/bone, involve an area of lymphedema/inflamed/infected tissue? No    Skin repair Complexity:  Complex Final length (cm):  6 Informed consent: discussed and consent obtained   Timeout: patient name, date of birth, surgical site, and procedure verified   Procedure prep:  Patient was prepped and draped in usual sterile fashion Prep type:  Chlorhexidine Anesthesia: the lesion was anesthetized in a standard fashion   Anesthetic:  1% lidocaine w/ epinephrine 1-100,000 buffered w/ 8.4% NaHCO3 Reason for type of repair: reduce tension to allow closure and preserve normal anatomy   Undermining: edges undermined   Subcutaneous layers (deep stitches):  Suture size:  3-0 Suture type: Vicryl (polyglactin 910), PDS (polydioxanone) and skin staples   Fine/surface layer approximation (top  stitches):  Suture removal (days):  21 Hemostasis  achieved with: suture, pressure and electrodesiccation Outcome: patient tolerated procedure well with no complications   Post-procedure details: sterile dressing applied and wound care instructions given   Dressing type: pressure dressing and petrolatum    traMADol (ULTRAM) 50 MG tablet Take 1 tablet (50 mg total) by mouth every 6 (six) hours as needed for up to 8 doses.   Return in about 3 weeks (around 11/20/2023) for SUTURE REMOVAL.  Owens Shark, CMA, am acting as scribe for Gwenith Daily, MD.    10/30/2023  HISTORY OF PRESENT ILLNESS  Amy Gilmore is seen in consultation at the request of Dr. Katrinka Blazing for biopsy-proven Squamous Cell Carcinoma in Situ on the mid frontal scalp. They note that the area has been present for about 3 months increasing in size with tenderness and crusting.  There is no history of previous treatment.  Reports no other new or changing lesions and has no other complaints today.  Medications and allergies: see patient chart.  Review of systems: Reviewed 8 systems and notable for the above skin cancer.  All other systems reviewed are unremarkable/negative, unless noted in the HPI. Past medical history, surgical history, family history, social history were also reviewed and are noted in the chart/questionnaire.    PHYSICAL EXAMINATION  General: Well-appearing, in no acute distress, alert and oriented x 4. Vitals reviewed in chart (if available).   Skin: Exam reveals a 1.7 x 1.2 cm erythematous papule and biopsy scar on the mid frontal scalp. There are rhytids, telangiectasias, and lentigines, consistent with photodamage.  Biopsy report(s) reviewed, confirming the diagnosis.   ASSESSMENT  1) Squamous Cell Carcinoma in Situ 2) photodamage 3) solar lentigines   PLAN   1. Due to location, size, histology, or recurrence and the likelihood of subclinical extension as well as the need to conserve normal surrounding tissue, the patient was deemed  acceptable for Mohs micrographic surgery (MMS).  The nature and purpose of the procedure, associated benefits and risks including recurrence and scarring, possible complications such as pain, infection, and bleeding, and alternative methods of treatment if appropriate were discussed with the patient during consent. The lesion location was verified by the patient, by reviewing previous notes, pathology reports, and by photographs as well as angulation measurements if available.  Informed consent was reviewed and signed by the patient, and timeout was performed at 10:00 AM. See op note below.  2. For the photodamage and solar lentigines, sun protection discussed/information given on OTC sunscreens, and we recommend continued regular follow-up with primary dermatologist every 6 months or sooner for any growing, bleeding, or changing lesions. 3. Prognosis and future surveillance discussed. 4. Letter with treatment outcome sent to referring provider. 5. Pain acetaminophen/ibuprofen/oxycodone 5 mg  MOHS MICROGRAPHIC SURGERY AND RECONSTRUCTION  Initial size:   1.7 x 1.2 cm Surgical defect/wound size: 2.1 x 1.8 cm Anesthesia:    0.33% lidocaine with 1:200,000 epinephrine EBL:    <5 mL Complications:  None Repair type:   Complex SQ suture:   3-0 Vicryl, 3-0 prolene Cutaneous suture:  Staples Final size of the repair: 6.0 cm  Stages: 1  STAGE I: Anesthesia achieved with 0.5% lidocaine with 1:200,000 epinephrine. ChloraPrep applied. 2 section(s) excised using Mohs technique (this includes total peripheral and deep tissue margin excision and evaluation with frozen sections, excised and interpreted by the same physician). The tumor was first debulked and then excised with an approx. 2mm margin.  Hemostasis was achieved  with electrocautery as needed.  The specimen was then oriented, subdivided/relaxed, inked, and processed using Mohs technique.    Frozen section analysis revealed a clear deep and peripheral  margin.  Reconstruction  The surgical wound was then cleaned, prepped, and re-anesthetized as above. Wound edges were undermined extensively along at least one entire edge and at a distance equal to or greater than the width of the defect (see wound defect size above) in order to achieve closure and decrease wound tension and anatomic distortion. Redundant tissue repair including standing cone removal was performed. Hemostasis was achieved with electrocautery. Subcutaneous and epidermal tissues were approximated with the above sutures. The surgical site was then lightly scrubbed with sterile, saline-soaked gauze. The area was then bandaged using Vaseline ointment, non-adherent gauze, gauze pads, and tape to provide an adequate pressure dressing. The patient tolerated the procedure well, was given detailed written and verbal wound care instructions, and was discharged in good condition.   The patient will follow-up: 3 weeks.   Documentation: I have reviewed the above documentation for accuracy and completeness, and I agree with the above.  Gwenith Daily, MD

## 2023-10-30 NOTE — Patient Instructions (Signed)

## 2023-11-05 DIAGNOSIS — H40003 Preglaucoma, unspecified, bilateral: Secondary | ICD-10-CM | POA: Diagnosis not present

## 2023-11-05 DIAGNOSIS — H53461 Homonymous bilateral field defects, right side: Secondary | ICD-10-CM | POA: Diagnosis not present

## 2023-11-05 DIAGNOSIS — Z961 Presence of intraocular lens: Secondary | ICD-10-CM | POA: Diagnosis not present

## 2023-11-12 ENCOUNTER — Telehealth: Payer: Self-pay | Admitting: Internal Medicine

## 2023-11-12 NOTE — Telephone Encounter (Signed)
Lvm for nurse with [facility] to return my call-Toni

## 2023-11-20 ENCOUNTER — Ambulatory Visit: Payer: Medicare Other | Admitting: Dermatology

## 2023-11-20 ENCOUNTER — Encounter: Payer: Self-pay | Admitting: Dermatology

## 2023-11-20 DIAGNOSIS — Z86007 Personal history of in-situ neoplasm of skin: Secondary | ICD-10-CM

## 2023-11-20 DIAGNOSIS — D099 Carcinoma in situ, unspecified: Secondary | ICD-10-CM

## 2023-11-20 DIAGNOSIS — L905 Scar conditions and fibrosis of skin: Secondary | ICD-10-CM

## 2023-11-20 NOTE — Progress Notes (Signed)
   Follow-Up Visit   Subjective  Amy Gilmore is a 87 y.o. 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 Katrinka Blazing.  The following portions of the chart were reviewed this encounter and updated as appropriate: medications, allergies, medical history  Review of Systems:  No other skin or systemic complaints except as noted in HPI or Assessment and Plan.  Objective  Well appearing patient in no apparent distress; mood and affect are within normal limits.  A focused examination was performed of the following areas: Frontal scalp  Relevant exam findings are noted in the Assessment and Plan.     Assessment & Plan   Encounter for Removal of Sutures and STaples - Incision site at the frontal scalp is clean, dry and intact - Wound cleansed, sutures removed, wound cleansed  - Scars remodel for a full year. -patient can apply over-the-counter silicone scar cream each night to help with scar remodeling if desired. - Patient advised to call with any concerns or if they notice any new or changing lesions.   Scar s/p Mohs for SCCIS on the frontal scalp, treated on 10/30/2023, repaired with linear closure - Reassured that wound has healed well - Discussed that scars take up to 12 months to mature from the date of surgery - Recommend SPF 30+ to scar daily to prevent purple color - OK to start scar massage at 4-6 weeks post-op - Can consider silicone based products for scar healing    Return if symptoms worsen or fail to improve.  I, Tillie Fantasia, CMA, am acting as scribe for Gwenith Daily, MD.   Documentation: I have reviewed the above documentation for accuracy and completeness, and I agree with the above.  Gwenith Daily, MD

## 2023-11-20 NOTE — Patient Instructions (Signed)

## 2023-12-19 DIAGNOSIS — R419 Unspecified symptoms and signs involving cognitive functions and awareness: Secondary | ICD-10-CM | POA: Diagnosis not present

## 2023-12-20 ENCOUNTER — Telehealth: Payer: Self-pay

## 2023-12-20 NOTE — Telephone Encounter (Signed)
We will discuss next week at her appointment.

## 2023-12-23 ENCOUNTER — Ambulatory Visit (INDEPENDENT_AMBULATORY_CARE_PROVIDER_SITE_OTHER): Payer: Medicare Other | Admitting: Nurse Practitioner

## 2023-12-23 ENCOUNTER — Encounter: Payer: Self-pay | Admitting: Nurse Practitioner

## 2023-12-23 VITALS — BP 132/76 | HR 64 | Temp 98.4°F | Resp 16 | Ht 62.5 in | Wt 114.6 lb

## 2023-12-23 DIAGNOSIS — E782 Mixed hyperlipidemia: Secondary | ICD-10-CM | POA: Diagnosis not present

## 2023-12-23 DIAGNOSIS — R42 Dizziness and giddiness: Secondary | ICD-10-CM

## 2023-12-23 DIAGNOSIS — I10 Essential (primary) hypertension: Secondary | ICD-10-CM | POA: Diagnosis not present

## 2023-12-23 MED ORDER — HYDROXYZINE HCL 25 MG PO TABS: 25.0000 mg | ORAL_TABLET | Freq: Three times a day (TID) | ORAL | 0 refills | Status: DC | PRN

## 2023-12-23 MED ORDER — ROSUVASTATIN CALCIUM 10 MG PO TABS: 10.0000 mg | ORAL_TABLET | Freq: Every day | ORAL | 1 refills | Status: DC

## 2023-12-23 NOTE — Progress Notes (Signed)
[facility] [address] [place], [place] [address]  Internal MEDICINE  Office Visit Note  Patient Name: Amy Gilmore  [id number]  [id number]  Date of Service: 12/23/2023  Chief Complaint  Patient presents with   Hyperlipidemia   Hypertension   Follow-up    HPI Amy Gilmore presents for a follow-up visit for  Wants to switch to rosuvastatin.  Dizziness --  Hypertension -- still     Current Medication: Outpatient Encounter Medications as of 12/23/2023  Medication Sig   acetaminophen (TYLENOL) 500 MG tablet Take 1,000 mg by mouth every 6 (six) hours as needed. Pain    amLODipine (NORVASC) 5 MG tablet Take 1 tablet (5 mg total) by mouth daily.   aspirin 81 MG tablet Take 81 mg by mouth daily.   atorvastatin (LIPITOR) 10 MG tablet TAKE 1 TABLET(10 MG) BY MOUTH AT BEDTIME   escitalopram (LEXAPRO) 10 MG tablet Take one tab po every day for depression   hydrOXYzine (ATARAX) 25 MG tablet Take 1 tablet (25 mg total) by mouth 3 (three) times daily as needed (dizziness and vertigo).   Hypromellose 0.3 % SOLN Place 1-2 drops into both eyes 2 (two) times daily as needed. Dry eyes    meclizine (ANTIVERT) 25 MG tablet Take 1 tablet (25 mg total) by mouth 3 (three) times daily as needed for dizziness (vertigo).   meloxicam (MOBIC) 15 MG tablet Take one tab po qd prn for arthritis   Multiple Vitamins-Minerals (CENTRUM SILVER) tablet Take 1 tablet by mouth daily.   RESTASIS 0.05 % ophthalmic emulsion INSTILL 1 DROP INTO BOTH EYES TWICE A DAY   traMADol (ULTRAM) 50 MG tablet Take 1 tablet (50 mg total) by mouth every 6 (six) hours as needed for up to 8 doses.   trimethoprim-polymyxin b (POLYTRIM) ophthalmic solution SMARTSIG:In Eye(s)   valsartan-hydrochlorothiazide (DIOVAN-HCT) 320-12.5 MG tablet TAKE 1 TABLET BY MOUTH DAILY FOR HIGH BLOOD PRESSURE   No facility-administered encounter medications on file as of 12/23/2023.    Surgical History: Past Surgical History:   Procedure Laterality Date   APPENDECTOMY  1955   BUNIONECTOMY  1995   and osteotomy   COLONOSCOPY  2017   CYSTOSCOPY  02/22/2012   Procedure: CYSTOSCOPY;  Surgeon: Garnett Farm, MD;  Location: [facility];  Service: Urology;  Laterality: Right;  removal of ureteral stent   FLEXIBLE SIGMOIDOSCOPY  08/12/2012   Procedure: FLEXIBLE SIGMOIDOSCOPY;  Surgeon: Louis Meckel, MD;  Location: [facility];  Service: Endoscopy;  Laterality: N/A;   NEPHROLITHOTOMY  01/28/2012-  right ureteral stent placement   Procedure: NEPHROLITHOTOMY PERCUTANEOUS;  Surgeon: Garnett Farm, MD;  Location: [facility];  Service: Urology;  Laterality: Right;   OTHER SURGICAL HISTORY  01/28/12   right percutaneous nephroliathothoma   REFRACTIVE SURGERY  yrs ago   ROTATOR CUFF REPAIR  1997 and 2006   right   URETEROSCOPY  02/22/2012   Procedure: URETEROSCOPY;  Surgeon: Garnett Farm, MD;  Location: [facility];  Service: Urology;  Laterality: Right;  URETEROSCOPY WITH STONE EXTRACTION    VAGINAL HYSTERECTOMY  1985    Medical History: Past Medical History:  Diagnosis Date   Adenomatous colon polyp 07/03/2012   Diverticulosis    Frequency of urination    History of kidney stones    Hyperlipidemia    Hypertension    Internal hemorrhoids    Mitral valve prolapse per dr wall note 09/12--  stable   PAC (premature atrial contraction) followed by  cardiologist --  dr wall--  last visit  sept 12  note in epic--  states stable   Palpitations hx pac's-   occasional   Renal calculus, right followed by dr Vernie Ammons   February 2013   Right shoulder pain Dec 07, 2011 fall   pain at times with positioning   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   frontal scalp within hair part, Mohs   Urgency of micturation    Vertigo, intermittent     Family History: Family History  Problem Relation Age of Onset   Liver cancer Mother    Heart attack Sister    Peripheral vascular disease Sister     Colon cancer Neg Hx    Breast cancer Neg Hx     Social History   Socioeconomic History   Marital status: Married    Spouse name: Not on file   Number of children: 3   Years of education: Not on file   Highest education level: Not on file  Occupational History   Occupation: Housewife  Tobacco Use   Smoking status: Former    Current packs/day: 0.00    Average packs/day: 1 pack/day for 15.0 years (15.0 ttl pk-yrs)    Types: Cigarettes    Start date: 12/03/1960    Quit date: 12/04/1975    Years since quitting: 48.0   Smokeless tobacco: Never  Vaping Use   Vaping status: Never Used  Substance and Sexual Activity   Alcohol use: Not Currently    Alcohol/week: 7.0 standard drinks of alcohol    Types: 7 Glasses of wine per week    Comment: 2-4 oz several times per week   Drug use: No   Sexual activity: Not on file  Other Topics Concern   Not on file  Social History Narrative   Married.   Gets regular exercise.   Caffeine daily. 3 cups of coffee or soda per day   Social Drivers of Corporate investment banker Strain: Not on file  Food Insecurity: Not on file  Transportation Needs: Not on file  Physical Activity: Not on file  Stress: Not on file  Social Connections: Not on file  Intimate Partner Violence: Not on file      Review of Systems  Vital Signs: BP 132/76   Pulse 64   Temp 98.4 F (36.9 C)   Resp 16   Ht 5' 2.5" (1.588 m)   Wt 114 lb 9.6 oz (52 kg)   SpO2 97%   BMI 20.63 kg/m    Physical Exam     Assessment/Plan: 1. Mixed hyperlipidemia (Primary) *** - rosuvastatin (CRESTOR) 10 MG tablet; Take 1 tablet (10 mg total) by mouth daily.  Dispense: 90 tablet; Refill: 1  2. Essential hypertension ***  3. Vertigo *** - hydrOXYzine (ATARAX) 25 MG tablet; Take 1 tablet (25 mg total) by mouth 3 (three) times daily as needed (dizziness and vertigo).  Dispense: 60 tablet; Refill: 0   General Counseling: Amy Gilmore verbalizes understanding of the  findings of todays visit and agrees with plan of treatment. I have discussed any further diagnostic evaluation that may be needed or ordered today. We also reviewed her medications today. she has been encouraged to call the office with any questions or concerns that should arise related to todays visit.    No orders of the defined types were placed in this encounter.   No orders of the defined types were placed in this encounter.   No follow-ups on  file.   Total time spent:*** Minutes Time spent includes review of chart, medications, test results, and follow up plan with the patient.   [place] Controlled Substance Database was reviewed by me.  This patient was seen by Sallyanne Kuster, FNP-C in collaboration with Dr. Beverely Risen as a part of collaborative care agreement.   Maryruth Apple R. Tedd Sias, MSN, FNP-C Internal medicine

## 2023-12-24 ENCOUNTER — Encounter: Payer: Self-pay | Admitting: Nurse Practitioner

## 2024-01-01 ENCOUNTER — Encounter (INDEPENDENT_AMBULATORY_CARE_PROVIDER_SITE_OTHER): Payer: Medicare Other | Admitting: Ophthalmology

## 2024-01-01 DIAGNOSIS — H353111 Nonexudative age-related macular degeneration, right eye, early dry stage: Secondary | ICD-10-CM

## 2024-01-01 DIAGNOSIS — H43813 Vitreous degeneration, bilateral: Secondary | ICD-10-CM | POA: Diagnosis not present

## 2024-01-01 DIAGNOSIS — H35033 Hypertensive retinopathy, bilateral: Secondary | ICD-10-CM | POA: Diagnosis not present

## 2024-01-01 DIAGNOSIS — I10 Essential (primary) hypertension: Secondary | ICD-10-CM | POA: Diagnosis not present

## 2024-01-01 DIAGNOSIS — H353221 Exudative age-related macular degeneration, left eye, with active choroidal neovascularization: Secondary | ICD-10-CM

## 2024-01-05 ENCOUNTER — Other Ambulatory Visit: Payer: Self-pay | Admitting: Nurse Practitioner

## 2024-01-05 DIAGNOSIS — I10 Essential (primary) hypertension: Secondary | ICD-10-CM

## 2024-03-07 ENCOUNTER — Ambulatory Visit (INDEPENDENT_AMBULATORY_CARE_PROVIDER_SITE_OTHER): Payer: PRIVATE HEALTH INSURANCE | Admitting: Podiatry

## 2024-03-07 ENCOUNTER — Encounter: Payer: Self-pay | Admitting: Podiatry

## 2024-03-07 ENCOUNTER — Ambulatory Visit (INDEPENDENT_AMBULATORY_CARE_PROVIDER_SITE_OTHER)

## 2024-03-07 DIAGNOSIS — L97511 Non-pressure chronic ulcer of other part of right foot limited to breakdown of skin: Secondary | ICD-10-CM

## 2024-03-07 DIAGNOSIS — M79671 Pain in right foot: Secondary | ICD-10-CM | POA: Diagnosis not present

## 2024-03-07 DIAGNOSIS — M205X1 Other deformities of toe(s) (acquired), right foot: Secondary | ICD-10-CM | POA: Diagnosis not present

## 2024-03-07 MED ORDER — MUPIROCIN 2 % EX OINT: 1.0000 | TOPICAL_OINTMENT | Freq: Two times a day (BID) | CUTANEOUS | 2 refills | Status: DC

## 2024-03-07 NOTE — Patient Instructions (Signed)
 Monitor for any signs/symptoms of infection. Call the office immediately if any occur or go directly to the emergency room. Call with any questions/concerns.

## 2024-03-08 NOTE — Progress Notes (Signed)
  Subjective:  Patient ID: Amy Gilmore, female    DOB: 1936/11/09,  MRN: [id number]  Chief Complaint  Patient presents with   Foot Pain    RM#11 Right foot pinky toe pain and redness also swelling.No injuries worsening over 1 year.    Discussed the use of AI scribe software for clinical note transcription with the patient, who gave verbal consent to proceed.  History of Present Illness The patient presents with a painful right fifth toe that has been ongoing for at least six months. The pain began as a small corn that has progressively worsened and enlarged over time. The pain is exacerbated by walking, particularly when walking her dog. The patient has attempted over-the-counter treatments, including corn removers, with no relief. The pain is primarily experienced when wearing shoes. The patient denies any bleeding or drainage from the area. She also denies any arthritis in the foot.      Objective:    Physical Exam General: AAO x3, NAD  Dermatological: On the right fifth toe laterally along the PIPJ is a thick hyperkeratotic lesion.  Upon debridement there is a superficial area of skin breakdown but there is no probing, undermining or tunneling.  No surrounding erythema.  Minimal edema.  There is no fluctuation or crepitation.  No malodor.  Vascular: Dorsalis Pedis artery and Posterior Tibial artery pedal pulses are 2/4 bilateral with immedate capillary fill time. There is no pain with calf compression, swelling, warmth, erythema.   Neruologic: Grossly intact via light touch bilateral.   Musculoskeletal: Digital contractures present with arthritis present of the first MTPJ.  No other areas of discomfort besides the right fifth toe.  Gait: Unassisted, Nonantalgic.    No images are attached to the encounter.    Results   RADIOLOGY Right foot X-ray: Arthritis in the first metatarsophalangeal joint, evidence of previous surgery, varus deformity of the fifth toe with  bony prominence (03/07/2024)   Assessment:   1. Acquired adductovarus rotation of toe, right   2. Toe ulcer, right, limited to breakdown of skin [facility])      Plan:  Patient was evaluated and treated and all questions answered.  Assessment and Plan Assessment & Plan Corns with secondary wound formation on the right fifth toe Chronic corn on the right fifth toe with secondary wound due to pressure and anatomical deformity. No infection but risk present. -Sharply debrided hyperkeratotic lesion right fifth toe without any complications to reveal small superficial area skin breakdown.  No signs of infection.  Recommend mupirocin ointment dressing changes daily.  Antibiotic ointment is applied followed by dressing today. - Provide pads and cushions to offload pressure.  Discussed use to avoid excess pressure - Instruct to monitor for infection signs: increased swelling, redness, drainage. - Schedule follow-up in 3-4 weeks to reassess. - Advise to contact clinic if infection signs develop.  Arthritis in the right big toe joint Arthritis in the right big toe joint, confirmed by x-ray. Previous surgery noted. Currently asymptomatic.   Return in about 4 weeks (around 04/04/2024), or if symptoms worsen or fail to improve, for toe pain.   Vivi Barrack DPM

## 2024-03-11 ENCOUNTER — Encounter (INDEPENDENT_AMBULATORY_CARE_PROVIDER_SITE_OTHER): Payer: Medicare Other | Admitting: Ophthalmology

## 2024-03-11 DIAGNOSIS — H43813 Vitreous degeneration, bilateral: Secondary | ICD-10-CM

## 2024-03-11 DIAGNOSIS — H353112 Nonexudative age-related macular degeneration, right eye, intermediate dry stage: Secondary | ICD-10-CM

## 2024-03-11 DIAGNOSIS — I10 Essential (primary) hypertension: Secondary | ICD-10-CM

## 2024-03-11 DIAGNOSIS — H353221 Exudative age-related macular degeneration, left eye, with active choroidal neovascularization: Secondary | ICD-10-CM | POA: Diagnosis not present

## 2024-03-11 DIAGNOSIS — H35033 Hypertensive retinopathy, bilateral: Secondary | ICD-10-CM

## 2024-03-31 ENCOUNTER — Other Ambulatory Visit: Payer: Self-pay | Admitting: Nurse Practitioner

## 2024-03-31 DIAGNOSIS — E782 Mixed hyperlipidemia: Secondary | ICD-10-CM

## 2024-04-02 ENCOUNTER — Encounter: Payer: Self-pay | Admitting: Podiatry

## 2024-04-02 ENCOUNTER — Telehealth: Payer: Self-pay | Admitting: Podiatry

## 2024-04-02 ENCOUNTER — Ambulatory Visit: Admitting: Podiatry

## 2024-04-02 DIAGNOSIS — M205X1 Other deformities of toe(s) (acquired), right foot: Secondary | ICD-10-CM

## 2024-04-02 DIAGNOSIS — L84 Corns and callosities: Secondary | ICD-10-CM | POA: Diagnosis not present

## 2024-04-02 DIAGNOSIS — L539 Erythematous condition, unspecified: Secondary | ICD-10-CM

## 2024-04-02 MED ORDER — CEPHALEXIN 500 MG PO CAPS: 500.0000 mg | ORAL_CAPSULE | Freq: Three times a day (TID) | ORAL | 0 refills | Status: DC

## 2024-04-02 NOTE — Telephone Encounter (Signed)
 Pts husband called back and states pt is wanting to postpone the surgery she not wanting to do the surgery right now and will call back to r/s

## 2024-04-02 NOTE — Progress Notes (Signed)
  Subjective:  Patient ID: Amy Gilmore, female    DOB: Jul 25, 1936,  MRN: [id number] Chief Complaint  Patient presents with   Toe Pain    RM#12 Right foot pain on the side of pinky toe still continues to experience pain.    History of Present Illness The patient presents today for follow-up evaluation of a painful right fifth toe that has been ongoing for at least six months.  After last open he did feel better for short time but the pain has come back.  She denies any injury or pus.  No injuries that she reports.    Objective:    Physical Exam General: AAO x3, NAD-wearing open toed sandal  Dermatological: On the right fifth toe laterally along the PIPJ is a thick hyperkeratotic lesion.  Upon debridement there is no underlying ulceration, drainage or signs of infection today however that there is localized edema and erythema to the toe.  There is no ascending cellulitis.  There is no malodor.  Vascular: Dorsalis Pedis artery and Posterior Tibial artery pedal pulses are 2/4 bilateral with immedate capillary fill time. There is no pain with calf compression, swelling, warmth, erythema.   Neruologic: Grossly intact via light touch bilateral.   Musculoskeletal: Digital contractures present with arthritis present of the first MTPJ.      No images are attached to the encounter.    Results   RADIOLOGY  Assessment:   Preulcerative callus, adductovarus right fifth toe  Plan:  Patient was evaluated and treated and all questions answered.  Assessment and Plan Assessment & Plan Corns with secondary wound formation on the right fifth toe  -Sharply debrided hyperkeratotic lesion right fifth toe without any complications or bleeding.  There is some localized edema and erythema present to the toes I did prescribe cephalexin  today.  I still continues on that antibiotic ointment on the area. - We discussed both conservative as well as surgical treatment options.  Ultimately  discussed arthroplasty of the toe and excision of the skin lesion.  After discussion regards to treatment options she wants to proceed with surgery. -The incision placement as well as the postoperative course was discussed with the patient. I discussed risks of the surgery which include, but not limited to, infection, bleeding, pain, swelling, need for further surgery, delayed or nonhealing, painful or ugly scar, numbness or sensation changes, over/under correction, recurrence, transfer lesions, further deformity, DVT/PE, loss of toe/foot. Patient understands these risks and wishes to proceed with surgery. The surgical consent was reviewed with the patient all 3 pages were signed. No promises or guarantees were given to the outcome of the procedure. All questions were answered to the best of my ability. Before the surgery the patient was encouraged to call the office if there is any further questions. The surgery will be performed at the [facility] on an outpatient basis.   Charity Conch DPM

## 2024-04-02 NOTE — Telephone Encounter (Signed)
 Left message for pt that we were able to get the surgery for next Wednesday 04/08/24 and to call to confirm with us  that she received this message.

## 2024-04-02 NOTE — Patient Instructions (Signed)
 Pre-Operative Instructions  Congratulations, you have decided to take an important step to improving your quality of life.  You can be assured that the doctors of [facility] will be with you every step of the way.  Plan to be at the surgery center/hospital at least 1 (one) hour prior to your scheduled time unless otherwise directed by the surgical center/hospital staff.  You must have a responsible adult accompany you, remain during the surgery and drive you home.  Make sure you have directions to the surgical center/hospital and know how to get there on time. For hospital based surgery you will need to obtain a history and physical form from your family physician within 1 month prior to the date of surgery- we will give you a form for you primary physician.  We make every effort to accommodate the date you request for surgery.  There are however, times where surgery dates or times have to be moved.  We will contact you as soon as possible if a change in schedule is required.   No Aspirin /Ibuprofen for one week before surgery.  If you are on aspirin , any non-steroidal anti-inflammatory medications (Mobic , Aleve, Ibuprofen) you should stop taking it 7 days prior to your surgery.  You make take Tylenol   For pain prior to surgery.  Medications- If you are taking daily heart and blood pressure medications, seizure, reflux, allergy, asthma, anxiety, pain or diabetes medications, make sure the surgery center/hospital is aware before the day of surgery so they may notify you which medications to take or avoid the day of surgery. No food or drink after midnight the night before surgery unless directed otherwise by surgical center/hospital staff. No alcoholic beverages 24 hours prior to surgery.  No smoking 24 hours prior to or 24 hours after surgery. Wear loose pants or shorts- loose enough to fit over bandages, boots, and casts. No slip on shoes, sneakers are best. Bring your boot with you to the  surgery center/hospital.  Also bring crutches or a walker if your physician has prescribed it for you.  If you do not have this equipment, it will be provided for you after surgery. If you have not been contracted by the surgery center/hospital by the day before your surgery, call to confirm the date and time of your surgery. Leave-time from work may vary depending on the type of surgery you have.  Appropriate arrangements should be made prior to surgery with your employer. Prescriptions will be provided immediately following surgery by your doctor.  Have these filled as soon as possible after surgery and take the medication as directed. Remove nail polish on the operative foot. Wash the night before surgery.  The night before surgery wash the foot and leg well with the antibacterial soap provided and water paying special attention to beneath the toenails and in between the toes.  Rinse thoroughly with water and dry well with a towel.  Perform this wash unless told not to do so by your physician.  Enclosed: 1 Ice pack (please put in freezer the night before surgery)   1 Hibiclens skin cleaner   Pre-op Instructions  If you have any questions regarding the instructions, do not hesitate to call our office at any point during this process.   [place]: [address] [place], [place] [address] [phone]  Dr. Bobbie Burows, DPM

## 2024-04-13 ENCOUNTER — Encounter: Admitting: Podiatry

## 2024-04-23 ENCOUNTER — Encounter: Admitting: Podiatry

## 2024-05-05 DIAGNOSIS — H40001 Preglaucoma, unspecified, right eye: Secondary | ICD-10-CM | POA: Diagnosis not present

## 2024-05-05 DIAGNOSIS — H353221 Exudative age-related macular degeneration, left eye, with active choroidal neovascularization: Secondary | ICD-10-CM | POA: Diagnosis not present

## 2024-05-05 DIAGNOSIS — H401124 Primary open-angle glaucoma, left eye, indeterminate stage: Secondary | ICD-10-CM | POA: Diagnosis not present

## 2024-05-05 DIAGNOSIS — H353111 Nonexudative age-related macular degeneration, right eye, early dry stage: Secondary | ICD-10-CM | POA: Diagnosis not present

## 2024-05-07 ENCOUNTER — Encounter: Admitting: Podiatry

## 2024-05-25 ENCOUNTER — Ambulatory Visit (INDEPENDENT_AMBULATORY_CARE_PROVIDER_SITE_OTHER): Payer: Medicare Other | Admitting: Nurse Practitioner

## 2024-05-25 ENCOUNTER — Encounter: Payer: Self-pay | Admitting: Nurse Practitioner

## 2024-05-25 VITALS — BP 136/68 | HR 71 | Temp 98.2°F | Resp 16 | Ht 62.5 in | Wt 114.8 lb

## 2024-05-25 DIAGNOSIS — I10 Essential (primary) hypertension: Secondary | ICD-10-CM

## 2024-05-25 DIAGNOSIS — E782 Mixed hyperlipidemia: Secondary | ICD-10-CM

## 2024-05-25 DIAGNOSIS — R42 Dizziness and giddiness: Secondary | ICD-10-CM

## 2024-05-25 DIAGNOSIS — R7303 Prediabetes: Secondary | ICD-10-CM

## 2024-05-25 DIAGNOSIS — R419 Unspecified symptoms and signs involving cognitive functions and awareness: Secondary | ICD-10-CM

## 2024-05-25 DIAGNOSIS — M150 Primary generalized (osteo)arthritis: Secondary | ICD-10-CM

## 2024-05-25 MED ORDER — ROSUVASTATIN CALCIUM 10 MG PO TABS: 10.0000 mg | ORAL_TABLET | Freq: Every day | ORAL | 1 refills | Status: DC

## 2024-05-25 MED ORDER — MELOXICAM 15 MG PO TABS: ORAL_TABLET | ORAL | 3 refills | Status: DC

## 2024-05-25 NOTE — Progress Notes (Signed)
 [facility] [address] [place], [place] [address]  Internal MEDICINE  Office Visit Note  Patient Name: Amy Gilmore  [id number]  [id number]  Date of Service: 05/25/2024  Chief Complaint  Patient presents with   Hypertension   Hyperlipidemia   Follow-up    HPI Amy Gilmore presents for a follow-up visit for vertigo, hypertension and high cholesterol.  Vertigo - has been seeing specialists, meclizine  does not help very much and neither did hydroxyzine  Hypertension -- controlled with amlodipine  and valsartan -hydrochlorothiazide   High choesterol -- takes rosuvastatin   Sees eye doctor regularly for eye issues.     Current Medication: Outpatient Encounter Medications as of 05/25/2024  Medication Sig   acetaminophen  (TYLENOL ) 500 MG tablet Take 1,000 mg by mouth every 6 (six) hours as needed. Pain    amLODipine  (NORVASC ) 5 MG tablet TAKE 1 TABLET(5 MG) BY MOUTH DAILY   aspirin  81 MG tablet Take 81 mg by mouth daily.   hydrOXYzine  (ATARAX ) 25 MG tablet Take 1 tablet (25 mg total) by mouth 3 (three) times daily as needed (dizziness and vertigo).   Hypromellose 0.3 % SOLN Place 1-2 drops into both eyes 2 (two) times daily as needed. Dry eyes    meloxicam  (MOBIC ) 15 MG tablet Take one tab po qd prn for arthritis   Multiple Vitamins-Minerals (CENTRUM SILVER) tablet Take 1 tablet by mouth daily.   mupirocin  ointment (BACTROBAN ) 2 % Apply 1 Application topically 2 (two) times daily.   RESTASIS  0.05 % ophthalmic emulsion INSTILL 1 DROP INTO BOTH EYES TWICE A DAY   rosuvastatin  (CRESTOR ) 10 MG tablet Take 1 tablet (10 mg total) by mouth daily.   trimethoprim-polymyxin b (POLYTRIM) ophthalmic solution SMARTSIG:In Eye(s)   valsartan -hydrochlorothiazide  (DIOVAN -HCT) 320-12.5 MG tablet TAKE 1 TABLET BY MOUTH DAILY FOR HIGH BLOOD PRESSURE   [DISCONTINUED] cephALEXin  (KEFLEX ) 500 MG capsule Take 1 capsule (500 mg total) by mouth 3 (three) times daily.   [DISCONTINUED]  meloxicam  (MOBIC ) 15 MG tablet Take one tab po qd prn for arthritis   [DISCONTINUED] rosuvastatin  (CRESTOR ) 10 MG tablet Take 1 tablet (10 mg total) by mouth daily.   No facility-administered encounter medications on file as of 05/25/2024.    Surgical History: Past Surgical History:  Procedure Laterality Date   APPENDECTOMY  1955   BUNIONECTOMY  1995   and osteotomy   COLONOSCOPY  2017   CYSTOSCOPY  02/22/2012   Procedure: CYSTOSCOPY;  Surgeon: Mark C Ottelin, MD;  Location: [facility];  Service: Urology;  Laterality: Right;  removal of ureteral stent   FLEXIBLE SIGMOIDOSCOPY  08/12/2012   Procedure: FLEXIBLE SIGMOIDOSCOPY;  Surgeon: Lamar JONETTA Aho, MD;  Location: [facility];  Service: Endoscopy;  Laterality: N/A;   NEPHROLITHOTOMY  01/28/2012-  right ureteral stent placement   Procedure: NEPHROLITHOTOMY PERCUTANEOUS;  Surgeon: Mark C Ottelin, MD;  Location: [facility];  Service: Urology;  Laterality: Right;   OTHER SURGICAL HISTORY  01/28/12   right percutaneous nephroliathothoma   REFRACTIVE SURGERY  yrs ago   ROTATOR CUFF REPAIR  1997 and 2006   right   URETEROSCOPY  02/22/2012   Procedure: URETEROSCOPY;  Surgeon: Oneil JAYSON Rafter, MD;  Location: [facility];  Service: Urology;  Laterality: Right;  URETEROSCOPY WITH STONE EXTRACTION    VAGINAL HYSTERECTOMY  1985    Medical History: Past Medical History:  Diagnosis Date   Adenomatous colon polyp 07/03/2012   Diverticulosis    Frequency of urination    History of kidney stones  Hyperlipidemia    Hypertension    Internal hemorrhoids    Mitral valve prolapse per dr wall note 09/12--  stable   PAC (premature atrial contraction) followed by cardiologist --  dr wall--  last visit  sept 12  note in epic--  states stable   Palpitations hx pac's-   occasional   Renal calculus, right followed by dr ceil   February 2013   Right shoulder pain Dec 07, 2011 fall   pain at times with positioning   Squamous  cell carcinoma in situ (SCCIS) 10/03/2023   frontal scalp within hair part, Mohs   Urgency of micturation    Vertigo, intermittent     Family History: Family History  Problem Relation Age of Onset   Liver cancer Mother    Heart attack Sister    Peripheral vascular disease Sister    Colon cancer Neg Hx    Breast cancer Neg Hx     Social History   Socioeconomic History   Marital status: Married    Spouse name: Not on file   Number of children: 3   Years of education: Not on file   Highest education level: Not on file  Occupational History   Occupation: Housewife  Tobacco Use   Smoking status: Former    Current packs/day: 0.00    Average packs/day: 1 pack/day for 15.0 years (15.0 ttl pk-yrs)    Types: Cigarettes    Start date: 12/03/1960    Quit date: 12/04/1975    Years since quitting: 48.5   Smokeless tobacco: Never  Vaping Use   Vaping status: Never Used  Substance and Sexual Activity   Alcohol use: Not Currently    Alcohol/week: 7.0 standard drinks of alcohol    Types: 7 Glasses of wine per week    Comment: 2-4 oz several times per week   Drug use: No   Sexual activity: Not on file  Other Topics Concern   Not on file  Social History Narrative   Married.   Gets regular exercise.   Caffeine daily. 3 cups of coffee or soda per day   Social Drivers of Health   Financial Resource Strain: Not on file  Food Insecurity: Not on file  Transportation Needs: Not on file  Physical Activity: Not on file  Stress: Not on file  Social Connections: Not on file  Intimate Partner Violence: Not on file      Review of Systems  Constitutional:  Negative for chills, fatigue and unexpected weight change.  HENT:  Negative for congestion, rhinorrhea, sneezing and sore throat.   Eyes:  Negative for redness.  Respiratory: Negative.  Negative for cough, chest tightness, shortness of breath and wheezing.   Cardiovascular: Negative.  Negative for chest pain and palpitations.   Gastrointestinal:  Negative for abdominal pain, constipation, diarrhea, nausea and vomiting.  Genitourinary:  Negative for dysuria and frequency.  Musculoskeletal:  Negative for arthralgias, back pain, joint swelling and neck pain.  Skin:  Negative for rash.  Neurological:  Positive for dizziness. Negative for tremors and numbness.  Hematological:  Negative for adenopathy. Does not bruise/bleed easily.  Psychiatric/Behavioral:  Negative for behavioral problems (Depression), sleep disturbance and suicidal ideas. The patient is not nervous/anxious.     Vital Signs: BP 136/68   Pulse 71   Temp 98.2 F (36.8 C)   Resp 16   Ht 5' 2.5 (1.588 m)   Wt 114 lb 12.8 oz (52.1 kg)   SpO2 98%   BMI 20.66  kg/m    Physical Exam Vitals reviewed.  Constitutional:      General: She is not in acute distress.    Appearance: Normal appearance. She is normal weight. She is not ill-appearing.  HENT:     Head: Normocephalic and atraumatic.  Eyes:     Pupils: Pupils are equal, round, and reactive to light.  Cardiovascular:     Rate and Rhythm: Normal rate and regular rhythm.  Neurological:     Mental Status: She is alert and oriented to person, place, and time.     Cranial Nerves: No cranial nerve deficit.     Coordination: Coordination normal.     Gait: Gait normal.  Psychiatric:        Mood and Affect: Mood normal.        Behavior: Behavior normal.        Assessment/Plan: 1. Essential hypertension (Primary) Stable, continue amlodipine  and valsartan -hydrochlorothiazide  as prescribed. Routine labs ordered.  - CBC with Differential/Platelet - CMP14+EGFR - Lipid Profile - Hgb A1C w/o eAG  2. Mixed hyperlipidemia Continue rosuvastatin  as prescribed. Routine labs ordered  - rosuvastatin  (CRESTOR ) 10 MG tablet; Take 1 tablet (10 mg total) by mouth daily.  Dispense: 90 tablet; Refill: 1 - CBC with Differential/Platelet - CMP14+EGFR - Lipid Profile - Hgb A1C w/o eAG  3.  Prediabetes Routine labs ordered  - CBC with Differential/Platelet - CMP14+EGFR - Lipid Profile - Hgb A1C w/o eAG  4. Vertigo Routine labs ordered  - CBC with Differential/Platelet - CMP14+EGFR - Lipid Profile - Hgb A1C w/o eAG  5. Neurocognitive disorder Follow up with neurology   6. Primary osteoarthritis involving multiple joints Continue meloxicam  as prescribed.  - meloxicam  (MOBIC ) 15 MG tablet; Take one tab po qd prn for arthritis  Dispense: 30 tablet; Refill: 3   General Counseling: Amy Gilmore verbalizes understanding of the findings of todays visit and agrees with plan of treatment. I have discussed any further diagnostic evaluation that may be needed or ordered today. We also reviewed her medications today. she has been encouraged to call the office with any questions or concerns that should arise related to todays visit.    Orders Placed This Encounter  Procedures   CBC with Differential/Platelet   CMP14+EGFR   Lipid Profile   Hgb A1C w/o eAG    Meds ordered this encounter  Medications   meloxicam  (MOBIC ) 15 MG tablet    Sig: Take one tab po qd prn for arthritis    Dispense:  30 tablet    Refill:  3    Do not fill, patient will call if she needs it   rosuvastatin  (CRESTOR ) 10 MG tablet    Sig: Take 1 tablet (10 mg total) by mouth daily.    Dispense:  90 tablet    Refill:  1    Please discontinue atorvastatin  and fill new script for rosuvastatin  today asap    Return in about 6 months (around 11/24/2024) for previously scheduled, AWV, Amy Gilmore PCP.   Total time spent:30 Minutes Time spent includes review of chart, medications, test results, and follow up plan with the patient.   [place] Controlled Substance Database was reviewed by me.  This patient was seen by Mardy Maxin, FNP-C in collaboration with Dr. Sigrid Bathe as a part of collaborative care agreement.   Tayshon Winker R. Maxin, MSN, FNP-C Internal medicine

## 2024-05-27 ENCOUNTER — Encounter (INDEPENDENT_AMBULATORY_CARE_PROVIDER_SITE_OTHER): Admitting: Ophthalmology

## 2024-05-27 DIAGNOSIS — H353221 Exudative age-related macular degeneration, left eye, with active choroidal neovascularization: Secondary | ICD-10-CM | POA: Diagnosis not present

## 2024-05-27 DIAGNOSIS — I10 Essential (primary) hypertension: Secondary | ICD-10-CM

## 2024-05-27 DIAGNOSIS — H35033 Hypertensive retinopathy, bilateral: Secondary | ICD-10-CM

## 2024-05-27 DIAGNOSIS — H43813 Vitreous degeneration, bilateral: Secondary | ICD-10-CM | POA: Diagnosis not present

## 2024-05-27 DIAGNOSIS — H353112 Nonexudative age-related macular degeneration, right eye, intermediate dry stage: Secondary | ICD-10-CM | POA: Diagnosis not present

## 2024-06-01 ENCOUNTER — Telehealth: Payer: Self-pay | Admitting: Podiatry

## 2024-06-01 NOTE — Telephone Encounter (Signed)
 Pts husband called back and pt is scheduled for 07/01/24.  Not on any blood thinners and is not taking the asprin that is listed on her medications per pt and husband. Pharmacy correct in chart.

## 2024-06-01 NOTE — Telephone Encounter (Signed)
 Pts husband left message stating pt was to have surgery but postponed it due to travel and illness and she is ready to get scheduled. He schedules her appts for her.  I returned call and left message for pts husband to call me back to get scheduled.

## 2024-06-26 ENCOUNTER — Encounter: Payer: Self-pay | Admitting: Nurse Practitioner

## 2024-07-01 ENCOUNTER — Other Ambulatory Visit: Payer: Self-pay | Admitting: Podiatry

## 2024-07-01 DIAGNOSIS — M2041 Other hammer toe(s) (acquired), right foot: Secondary | ICD-10-CM | POA: Diagnosis not present

## 2024-07-01 MED ORDER — TRAMADOL HCL 50 MG PO TABS: 50.0000 mg | ORAL_TABLET | Freq: Three times a day (TID) | ORAL | 0 refills | Status: AC | PRN

## 2024-07-01 MED ORDER — PROMETHAZINE HCL 25 MG PO TABS: 25.0000 mg | ORAL_TABLET | Freq: Three times a day (TID) | ORAL | 0 refills | Status: DC | PRN

## 2024-07-01 MED ORDER — IBUPROFEN 600 MG PO TABS: 600.0000 mg | ORAL_TABLET | Freq: Three times a day (TID) | ORAL | 0 refills | Status: AC | PRN

## 2024-07-01 MED ORDER — CEPHALEXIN 500 MG PO CAPS: 500.0000 mg | ORAL_CAPSULE | Freq: Three times a day (TID) | ORAL | 0 refills | Status: DC

## 2024-07-06 ENCOUNTER — Ambulatory Visit (INDEPENDENT_AMBULATORY_CARE_PROVIDER_SITE_OTHER): Admitting: Podiatry

## 2024-07-06 ENCOUNTER — Ambulatory Visit (INDEPENDENT_AMBULATORY_CARE_PROVIDER_SITE_OTHER)

## 2024-07-06 ENCOUNTER — Encounter: Payer: Self-pay | Admitting: Podiatry

## 2024-07-06 DIAGNOSIS — M205X1 Other deformities of toe(s) (acquired), right foot: Secondary | ICD-10-CM | POA: Diagnosis not present

## 2024-07-07 NOTE — Progress Notes (Signed)
 Subjective: Chief Complaint  Patient presents with   Post-op Follow-up    Right 5th hammertoe repair. Some bruising present. Sutures intact.    88 year old female presents the office today for Evaluation status post right fifth digit repair.  She states she been doing well and her pain is controlled.  She does not report any fevers or chills.  No chest pain or shortness of breath.  Objective: AAO x3, NAD DP/PT pulses palpable bilaterally, CRT less than 3 seconds Incision well coapted with sutures intact.  There is no surrounding erythema, ascending cellulitis.  No drainage or pus.  Toe is rectus.  No significant pain on exam today No pain with calf compression, swelling, warmth, erythema  Assessment: Status post revision arthroplasty  Plan: -All treatment options discussed with the patient including all alternatives, risks, complications.  -X-rays obtained reviewed.  Multiple views obtained.  Status post fifth digit PIPJ arthroplasty.  No evidence of acute fracture. -Small amount of antibiotic ointment applied followed by dressing.  Keep dressing clean, dry, intact follow-up -Wearing surgical shoe -Ice/elevation -Pain medication prn -Monitor for any clinical signs or symptoms of infection and directed to call the office immediately should any occur or go to the ER. -Patient encouraged to call the office with any questions, concerns, change in symptoms.   Return for suture removal as scheduled.  Amy Gilmore Fees DPM

## 2024-07-14 ENCOUNTER — Other Ambulatory Visit: Payer: Self-pay | Admitting: Nurse Practitioner

## 2024-07-14 DIAGNOSIS — E782 Mixed hyperlipidemia: Secondary | ICD-10-CM

## 2024-07-16 ENCOUNTER — Encounter: Payer: Self-pay | Admitting: Podiatry

## 2024-07-16 ENCOUNTER — Ambulatory Visit (INDEPENDENT_AMBULATORY_CARE_PROVIDER_SITE_OTHER): Admitting: Podiatry

## 2024-07-16 VITALS — Ht 62.5 in | Wt 114.8 lb

## 2024-07-16 DIAGNOSIS — M205X1 Other deformities of toe(s) (acquired), right foot: Secondary | ICD-10-CM

## 2024-07-16 NOTE — Progress Notes (Signed)
 Subjective: Chief Complaint  Patient presents with   Routine Post Op    POV # 2 DOS 07/01/24 RT 5TH HAMMERTOE REPAIR W/ EXCISION OF SKIN LESION, pt is here to f/u on right foot, and suture removal      88 year old female presents the office today for follow-up evaluation status post right fifth digit repair.  She states she been doing well and her pain is controlled.  She presents today for suture removal. No fevers, chills. No pain today.   Objective: AAO x3, NAD DP/PT pulses palpable bilaterally, CRT less than 3 seconds Incision well coapted with sutures intact.  There is no surrounding erythema, ascending cellulitis.  No drainage or pus.  Toe is rectus.  No pain on exam.  No pain with calf compression, swelling, warmth, erythema  Assessment: Status post PIPJ arthroplasty  Plan: -All treatment options discussed with the patient including all alternatives, risks, complications.  -Sutures removed today. Incision is healing well. Antibiotic ointment and bandage applied. Discussed washing with soap and water, dry well and apply similar bandage.  -Continue with surgical shoe -Ice/elevation -Pain medication prn -Monitor for any clinical signs or symptoms of infection and directed to call the office immediately should any occur or go to the ER. -Patient encouraged to call the office with any questions, concerns, change in symptoms.   Return in about 2 weeks (around 07/30/2024).  Amy Gilmore DPM

## 2024-07-30 ENCOUNTER — Ambulatory Visit (INDEPENDENT_AMBULATORY_CARE_PROVIDER_SITE_OTHER): Admitting: Podiatry

## 2024-07-30 ENCOUNTER — Ambulatory Visit (INDEPENDENT_AMBULATORY_CARE_PROVIDER_SITE_OTHER)

## 2024-07-30 ENCOUNTER — Encounter: Payer: Self-pay | Admitting: Podiatry

## 2024-07-30 VITALS — Ht 62.5 in | Wt 114.8 lb

## 2024-07-30 DIAGNOSIS — M205X1 Other deformities of toe(s) (acquired), right foot: Secondary | ICD-10-CM

## 2024-07-30 NOTE — Progress Notes (Signed)
 Subjective: Chief Complaint  Patient presents with   Routine Post Op    POV # 3 DOS 07/01/24 RT 5TH HAMMERTOE REPAIR W/ EXCISION OF SKIN LESION, Pt states no pain to right foot, has no other complaints.      88 year old female presents the office today for follow-up evaluation status post right fifth digit repair.  States that she been doing well.  She is back to wearing regular shoes and she is not having any pain.  Minimal swelling.  No fevers or chills or other concerns.   Objective: AAO x3, NAD DP/PT pulses palpable bilaterally, CRT less than 3 seconds Incision well coapted eschar is formed.  There are some slight edema present there is no erythema or warmth.  There is no tenderness palpation on exam.  Toe is rectus.  No other areas of discomfort.   No pain with calf compression, swelling, warmth, erythema  Assessment: Status post PIPJ arthroplasty  Plan: -All treatment options discussed with the patient including all alternatives, risks, complications.  -X-rays obtained reviewed.  Status post arthroplasty without any complicating factors. -Overall doing well not having any pain.  She is already back to wearing regular shoe gear.  Encouraged to ice, elevate as well as compression doubly residual edema. -As she is doing well we will discharge her from postoperative course but she is well aware she can contact me with any questions or concerns or any changes.  She agrees with this plan.  Return if symptoms worsen or fail to improve.  Amy Gilmore DPM

## 2024-07-31 ENCOUNTER — Telehealth: Payer: Self-pay | Admitting: Internal Medicine

## 2024-07-31 NOTE — Telephone Encounter (Signed)
 Patient's husband called for a sick visit for possible uti. I advised he take her to urgent care or ED since I didn't have any available appointments for today.

## 2024-08-05 ENCOUNTER — Other Ambulatory Visit: Payer: Self-pay | Admitting: Podiatry

## 2024-08-19 ENCOUNTER — Encounter (INDEPENDENT_AMBULATORY_CARE_PROVIDER_SITE_OTHER): Admitting: Ophthalmology

## 2024-08-19 DIAGNOSIS — I10 Essential (primary) hypertension: Secondary | ICD-10-CM | POA: Diagnosis not present

## 2024-08-19 DIAGNOSIS — H35033 Hypertensive retinopathy, bilateral: Secondary | ICD-10-CM

## 2024-08-19 DIAGNOSIS — H353221 Exudative age-related macular degeneration, left eye, with active choroidal neovascularization: Secondary | ICD-10-CM | POA: Diagnosis not present

## 2024-08-19 DIAGNOSIS — H353112 Nonexudative age-related macular degeneration, right eye, intermediate dry stage: Secondary | ICD-10-CM

## 2024-08-19 DIAGNOSIS — H43813 Vitreous degeneration, bilateral: Secondary | ICD-10-CM

## 2024-09-08 ENCOUNTER — Other Ambulatory Visit: Payer: Self-pay | Admitting: Nurse Practitioner

## 2024-09-08 DIAGNOSIS — I10 Essential (primary) hypertension: Secondary | ICD-10-CM

## 2024-09-17 DIAGNOSIS — R419 Unspecified symptoms and signs involving cognitive functions and awareness: Secondary | ICD-10-CM | POA: Diagnosis not present

## 2024-10-13 DIAGNOSIS — I10 Essential (primary) hypertension: Secondary | ICD-10-CM | POA: Diagnosis not present

## 2024-10-13 DIAGNOSIS — E782 Mixed hyperlipidemia: Secondary | ICD-10-CM | POA: Diagnosis not present

## 2024-10-13 DIAGNOSIS — R7303 Prediabetes: Secondary | ICD-10-CM | POA: Diagnosis not present

## 2024-10-13 DIAGNOSIS — R42 Dizziness and giddiness: Secondary | ICD-10-CM | POA: Diagnosis not present

## 2024-10-14 LAB — CBC WITH DIFFERENTIAL/PLATELET
Basophils Absolute: 0.1 x10E3/uL (ref 0.0–0.2)
Basos: 1 %
EOS (ABSOLUTE): 0.1 x10E3/uL (ref 0.0–0.4)
Eos: 2 %
Hematocrit: 42.8 % (ref 34.0–46.6)
Hemoglobin: 13.8 g/dL (ref 11.1–15.9)
Immature Grans (Abs): 0 x10E3/uL (ref 0.0–0.1)
Immature Granulocytes: 0 %
Lymphocytes Absolute: 1.3 x10E3/uL (ref 0.7–3.1)
Lymphs: 24 %
MCH: 32.8 pg (ref 26.6–33.0)
MCHC: 32.2 g/dL (ref 31.5–35.7)
MCV: 102 fL — ABNORMAL HIGH (ref 79–97)
Monocytes Absolute: 0.5 x10E3/uL (ref 0.1–0.9)
Monocytes: 10 %
Neutrophils Absolute: 3.4 x10E3/uL (ref 1.4–7.0)
Neutrophils: 63 %
Platelets: 328 x10E3/uL (ref 150–450)
RBC: 4.21 x10E6/uL (ref 3.77–5.28)
RDW: 13 % (ref 11.7–15.4)
WBC: 5.4 x10E3/uL (ref 3.4–10.8)

## 2024-10-14 LAB — LIPID PANEL
Chol/HDL Ratio: 2.4 ratio (ref 0.0–4.4)
Cholesterol, Total: 175 mg/dL (ref 100–199)
HDL: 74 mg/dL
LDL Chol Calc (NIH): 84 mg/dL (ref 0–99)
Triglycerides: 92 mg/dL (ref 0–149)
VLDL Cholesterol Cal: 17 mg/dL (ref 5–40)

## 2024-10-14 LAB — CMP14+EGFR
ALT: 18 IU/L (ref 0–32)
AST: 23 IU/L (ref 0–40)
Albumin: 4.5 g/dL (ref 3.7–4.7)
Alkaline Phosphatase: 59 IU/L (ref 48–129)
BUN/Creatinine Ratio: 23 (ref 12–28)
BUN: 15 mg/dL (ref 8–27)
Bilirubin Total: 0.6 mg/dL (ref 0.0–1.2)
CO2: 28 mmol/L (ref 20–29)
Calcium: 9.8 mg/dL (ref 8.7–10.3)
Chloride: 101 mmol/L (ref 96–106)
Creatinine, Ser: 0.65 mg/dL (ref 0.57–1.00)
Globulin, Total: 2 g/dL (ref 1.5–4.5)
Glucose: 99 mg/dL (ref 70–99)
Potassium: 4 mmol/L (ref 3.5–5.2)
Sodium: 139 mmol/L (ref 134–144)
Total Protein: 6.5 g/dL (ref 6.0–8.5)
eGFR: 86 mL/min/1.73 (ref >59)

## 2024-10-14 LAB — HGB A1C W/O EAG: Hgb A1c MFr Bld: 5.5 % (ref 4.8–5.6)

## 2024-10-15 ENCOUNTER — Ambulatory Visit: Payer: Self-pay | Admitting: Internal Medicine

## 2024-10-21 ENCOUNTER — Ambulatory Visit (INDEPENDENT_AMBULATORY_CARE_PROVIDER_SITE_OTHER): Payer: Medicare Other | Admitting: Nurse Practitioner

## 2024-10-21 ENCOUNTER — Encounter: Payer: Self-pay | Admitting: Nurse Practitioner

## 2024-10-21 VITALS — BP 136/70 | HR 63 | Temp 97.7°F | Resp 16 | Ht 62.5 in | Wt 111.0 lb

## 2024-10-21 DIAGNOSIS — R7303 Prediabetes: Secondary | ICD-10-CM | POA: Diagnosis not present

## 2024-10-21 DIAGNOSIS — I10 Essential (primary) hypertension: Secondary | ICD-10-CM

## 2024-10-21 DIAGNOSIS — Z0001 Encounter for general adult medical examination with abnormal findings: Secondary | ICD-10-CM | POA: Diagnosis not present

## 2024-10-21 DIAGNOSIS — R419 Unspecified symptoms and signs involving cognitive functions and awareness: Secondary | ICD-10-CM

## 2024-10-21 DIAGNOSIS — E782 Mixed hyperlipidemia: Secondary | ICD-10-CM

## 2024-10-21 DIAGNOSIS — F321 Major depressive disorder, single episode, moderate: Secondary | ICD-10-CM | POA: Insufficient documentation

## 2024-10-21 DIAGNOSIS — R42 Dizziness and giddiness: Secondary | ICD-10-CM | POA: Insufficient documentation

## 2024-10-21 MED ORDER — MECLIZINE HCL 25 MG PO TABS: 25.0000 mg | ORAL_TABLET | Freq: Three times a day (TID) | ORAL | 3 refills | Status: AC | PRN

## 2024-10-21 MED ORDER — ROSUVASTATIN CALCIUM 10 MG PO TABS: 10.0000 mg | ORAL_TABLET | Freq: Every day | ORAL | 3 refills | Status: AC

## 2024-10-21 MED ORDER — AMLODIPINE BESYLATE 5 MG PO TABS: 5.0000 mg | ORAL_TABLET | Freq: Every day | ORAL | 3 refills | Status: AC

## 2024-10-21 NOTE — Progress Notes (Signed)
 [facility] [address] [place], [place] [address]  Internal MEDICINE  Office Visit Note  Patient Name: Amy Gilmore  [id number]  [id number]  Date of Service: 10/21/2024  Chief Complaint  Patient presents with   Hyperlipidemia   Hypertension   Medicare Wellness   HPI Reve presents for a medicare annual wellness visit. Accompanied by her husband to the office visit today.  Well-appearing 88 y.o. female with hypertension, high cholesterol, vertigo, mitral valve prolapse and insufficiency and history of TIA.  Labs: all labs are normal excepte elevated MCV of 102.  New or worsening pain: none  Other concerns: hydroxyzine  not helping with vertigo, want to switch back to meclizine .  No longer getting routine mammograms, bone density scans or colon cancer screenings.  No significant changes in her health or home life in the past year.      10/21/2024    9:08 AM 10/21/2023    8:53 AM 10/15/2022    9:00 AM  MMSE - Mini Mental State Exam  Orientation to time 0 3 0  Orientation to Place 5 3 5   Registration 3 2 3   Attention/ Calculation 5 3 5   Recall 0 2 3  Language- name 2 objects 2 2 2   Language- repeat 1 1 1   Language- follow 3 step command 3 3 3   Language- read & follow direction 1 1 1   Write a sentence 0 1 0  Copy design 1 1 1   Total score 21 22 24     Functional Status Survey: Is the patient deaf or have difficulty hearing?: No Does the patient have difficulty seeing, even when wearing glasses/contacts?: Yes Does the patient have difficulty concentrating, remembering, or making decisions?: No Does the patient have difficulty walking or climbing stairs?: No Does the patient have difficulty dressing or bathing?: No Does the patient have difficulty doing errands alone such as visiting a doctor's office or shopping?: No     04/10/2022   10:38 AM 10/15/2022    8:59 AM 04/15/2023    9:52 AM 10/21/2023    8:53 AM 10/21/2024    9:08 AM  Fall Risk   Falls in the past year? 0 0 0 0 0  Was there an injury with Fall?  0 0  0  Fall Risk Category Calculator  0 0  0  Fall Risk Category (Retired)  Low      (RETIRED) Patient Fall Risk Level  Low fall risk      Patient at Risk for Falls Due to  No Fall Risks No Fall Risks    Fall risk Follow up  Falls evaluation completed  Falls evaluation completed  Falls evaluation completed     Data saved with a previous flowsheet row definition       10/21/2024    9:08 AM  Depression screen PHQ 2/9  Decreased Interest 0  Down, Depressed, Hopeless 0  PHQ - 2 Score 0        Current Medication: Outpatient Encounter Medications as of 10/21/2024  Medication Sig   meclizine  (ANTIVERT ) 25 MG tablet Take 1 tablet (25 mg total) by mouth 3 (three) times daily as needed for dizziness.   acetaminophen  (TYLENOL ) 500 MG tablet Take 1,000 mg by mouth every 6 (six) hours as needed. Pain    amLODipine  (NORVASC ) 5 MG tablet Take 1 tablet (5 mg total) by mouth daily.   Hypromellose 0.3 % SOLN Place 1-2 drops into both eyes 2 (two) times daily as needed. Dry eyes  ibuprofen  (ADVIL ) 600 MG tablet Take 1 tablet (600 mg total) by mouth every 8 (eight) hours as needed.   Multiple Vitamins-Minerals (CENTRUM SILVER) tablet Take 1 tablet by mouth daily.   RESTASIS  0.05 % ophthalmic emulsion INSTILL 1 DROP INTO BOTH EYES TWICE A DAY   rosuvastatin  (CRESTOR ) 10 MG tablet Take 1 tablet (10 mg total) by mouth daily.   trimethoprim-polymyxin b (POLYTRIM) ophthalmic solution SMARTSIG:In Eye(s)   valsartan -hydrochlorothiazide  (DIOVAN -HCT) 320-12.5 MG tablet TAKE 1 TABLET BY MOUTH DAILY FOR HIGH BLOOD PRESSURE   [DISCONTINUED] amLODipine  (NORVASC ) 5 MG tablet TAKE 1 TABLET(5 MG) BY MOUTH DAILY   [DISCONTINUED] aspirin  81 MG tablet Take 81 mg by mouth daily. (Patient not taking: Reported on 10/21/2024)   [DISCONTINUED] cephALEXin  (KEFLEX ) 500 MG capsule Take 1 capsule (500 mg total) by mouth 3 (three) times daily.    [DISCONTINUED] hydrOXYzine  (ATARAX ) 25 MG tablet Take 1 tablet (25 mg total) by mouth 3 (three) times daily as needed (dizziness and vertigo).   [DISCONTINUED] mupirocin  ointment (BACTROBAN ) 2 % Apply 1 Application topically 2 (two) times daily.   [DISCONTINUED] promethazine  (PHENERGAN ) 25 MG tablet Take 1 tablet (25 mg total) by mouth every 8 (eight) hours as needed for nausea or vomiting.   [DISCONTINUED] rosuvastatin  (CRESTOR ) 10 MG tablet TAKE 1 TABLET(10 MG) BY MOUTH DAILY   No facility-administered encounter medications on file as of 10/21/2024.    Surgical History: Past Surgical History:  Procedure Laterality Date   APPENDECTOMY  1955   BUNIONECTOMY  1995   and osteotomy   COLONOSCOPY  2017   CYSTOSCOPY  02/22/2012   Procedure: CYSTOSCOPY;  Surgeon: Mark C Ottelin, MD;  Location: [facility];  Service: Urology;  Laterality: Right;  removal of ureteral stent   FLEXIBLE SIGMOIDOSCOPY  08/12/2012   Procedure: FLEXIBLE SIGMOIDOSCOPY;  Surgeon: Lamar JONETTA Aho, MD;  Location: [facility];  Service: Endoscopy;  Laterality: N/A;   NEPHROLITHOTOMY  01/28/2012-  right ureteral stent placement   Procedure: NEPHROLITHOTOMY PERCUTANEOUS;  Surgeon: Mark C Ottelin, MD;  Location: [facility];  Service: Urology;  Laterality: Right;   OTHER SURGICAL HISTORY  01/28/12   right percutaneous nephroliathothoma   REFRACTIVE SURGERY  yrs ago   ROTATOR CUFF REPAIR  1997 and 2006   right   URETEROSCOPY  02/22/2012   Procedure: URETEROSCOPY;  Surgeon: Oneil JAYSON Rafter, MD;  Location: [facility];  Service: Urology;  Laterality: Right;  URETEROSCOPY WITH STONE EXTRACTION    VAGINAL HYSTERECTOMY  1985    Medical History: Past Medical History:  Diagnosis Date   Adenomatous colon polyp 07/03/2012   Diverticulosis    Frequency of urination    History of kidney stones    Hyperlipidemia    Hypertension    Internal hemorrhoids    Mitral valve prolapse per dr wall note 09/12--   stable   PAC (premature atrial contraction) followed by cardiologist --  dr wall--  last visit  sept 12  note in epic--  states stable   Palpitations hx pac's-   occasional   Renal calculus, right followed by dr rafter   February 2013   Right shoulder pain Dec 07, 2011 fall   pain at times with positioning   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   frontal scalp within hair part, Mohs   Urgency of micturation    Vertigo, intermittent     Family History: Family History  Problem Relation Age of Onset   Liver cancer Mother  Heart attack Sister    Peripheral vascular disease Sister    Colon cancer Neg Hx    Breast cancer Neg Hx     Social History   Socioeconomic History   Marital status: Married    Spouse name: Not on file   Number of children: 3   Years of education: Not on file   Highest education level: Not on file  Occupational History   Occupation: Housewife  Tobacco Use   Smoking status: Former    Current packs/day: 0.00    Average packs/day: 1 pack/day for 15.0 years (15.0 ttl pk-yrs)    Types: Cigarettes    Start date: 12/03/1960    Quit date: 12/04/1975    Years since quitting: 48.9   Smokeless tobacco: Never  Vaping Use   Vaping status: Never Used  Substance and Sexual Activity   Alcohol use: Not Currently    Alcohol/week: 7.0 standard drinks of alcohol    Types: 7 Glasses of wine per week    Comment: 2-4 oz several times per week   Drug use: No   Sexual activity: Not on file  Other Topics Concern   Not on file  Social History Narrative   Married.   Gets regular exercise.   Caffeine daily. 3 cups of coffee or soda per day   Social Drivers of Health   Financial Resource Strain: Not on file  Food Insecurity: Not on file  Transportation Needs: Not on file  Physical Activity: Not on file  Stress: Not on file  Social Connections: Not on file  Intimate Partner Violence: Not on file      Review of Systems  Constitutional:  Negative for  chills, fatigue and unexpected weight change.  HENT:  Negative for congestion, postnasal drip, rhinorrhea, sneezing and sore throat.   Eyes:  Negative for redness.  Respiratory:  Negative for cough, chest tightness and shortness of breath.   Cardiovascular:  Negative for chest pain and palpitations.  Gastrointestinal:  Negative for abdominal pain, constipation, diarrhea, nausea and vomiting.  Genitourinary:  Negative for dysuria and frequency.  Musculoskeletal:  Negative for arthralgias, back pain, joint swelling and neck pain.  Skin:  Negative for rash.  Neurological: Negative.  Negative for tremors and numbness.  Hematological:  Negative for adenopathy. Does not bruise/bleed easily.  Psychiatric/Behavioral:  Positive for dysphoric mood. Negative for behavioral problems (Depression) and sleep disturbance. The patient is not nervous/anxious.     Vital Signs: BP 136/70   Pulse 63   Temp 97.7 F (36.5 C)   Resp 16   Ht 5' 2.5 (1.588 m)   Wt 111 lb (50.3 kg)   SpO2 96%   BMI 19.98 kg/m    Physical Exam Vitals reviewed.  Constitutional:      General: She is not in acute distress.    Appearance: Normal appearance. She is normal weight. She is not ill-appearing.  HENT:     Head: Normocephalic and atraumatic.     Right Ear: Tympanic membrane, ear canal and external ear normal.     Left Ear: Tympanic membrane, ear canal and external ear normal.     Nose: Nose normal. No congestion or rhinorrhea.     Mouth/Throat:     Mouth: Mucous membranes are moist.     Pharynx: Oropharynx is clear. No oropharyngeal exudate or posterior oropharyngeal erythema.  Eyes:     Extraocular Movements: Extraocular movements intact.     Pupils: Pupils are equal, round, and reactive to light.  Cardiovascular:     Rate and Rhythm: Normal rate and regular rhythm.     Pulses: Normal pulses.     Heart sounds: Normal heart sounds.  Pulmonary:     Effort: Pulmonary effort is normal. No respiratory distress.      Breath sounds: Normal breath sounds. No wheezing.  Neurological:     General: No focal deficit present.     Mental Status: She is alert and oriented to person, place, and time.     Cranial Nerves: No cranial nerve deficit.     Coordination: Coordination normal.     Gait: Gait normal.  Psychiatric:        Mood and Affect: Mood normal.        Behavior: Behavior normal.        Thought Content: Thought content normal.        Judgment: Judgment normal.        Assessment/Plan: 1. Encounter for Medicare annual examination with abnormal findings (Primary) Age-appropriate preventive screenings and vaccinations discussed. Routine labs for health maintenance results discussed with the patient today. PHM updated.    2. Prediabetes A1c is now normal.   3. Vertigo Discontinue hydroxyzine , may restart meclizine  as needed for dizziness and vertigo.  - meclizine  (ANTIVERT ) 25 MG tablet; Take 1 tablet (25 mg total) by mouth 3 (three) times daily as needed for dizziness.  Dispense: 90 tablet; Refill: 3  4. Essential hypertension Continue amlodipine  as prescribed.  - amLODipine  (NORVASC ) 5 MG tablet; Take 1 tablet (5 mg total) by mouth daily.  Dispense: 90 tablet; Refill: 3  5. Mixed hyperlipidemia Continue rosuvastatin  as prescribed.  - rosuvastatin  (CRESTOR ) 10 MG tablet; Take 1 tablet (10 mg total) by mouth daily.  Dispense: 90 tablet; Refill: 3  6. Neurocognitive disorder Stable, no issues at this time.        General Counseling: Amy Gilmore verbalizes understanding of the findings of todays visit and agrees with plan of treatment. I have discussed any further diagnostic evaluation that may be needed or ordered today. We also reviewed her medications today. she has been encouraged to call the office with any questions or concerns that should arise related to todays visit.    No orders of the defined types were placed in this encounter.   Meds ordered this encounter  Medications    amLODipine  (NORVASC ) 5 MG tablet    Sig: Take 1 tablet (5 mg total) by mouth daily.    Dispense:  90 tablet    Refill:  3    For future refills   rosuvastatin  (CRESTOR ) 10 MG tablet    Sig: Take 1 tablet (10 mg total) by mouth daily.    Dispense:  90 tablet    Refill:  3    For future refills   meclizine  (ANTIVERT ) 25 MG tablet    Sig: Take 1 tablet (25 mg total) by mouth 3 (three) times daily as needed for dizziness.    Dispense:  90 tablet    Refill:  3    Fill new script today, discontinue hydroxyzine .    Return in about 6 months (around 04/20/2025) for F/U, Rishi Vicario PCP.   Total time spent:30 Minutes Time spent includes review of chart, medications, test results, and follow up plan with the patient.   [place] Controlled Substance Database was reviewed by me.  This patient was seen by Mardy Maxin, FNP-C in collaboration with Dr. Sigrid Bathe as a part of collaborative care agreement.  Amy Difonzo R. Maxin, MSN, FNP-C  Internal medicine

## 2024-10-28 DIAGNOSIS — Z23 Encounter for immunization: Secondary | ICD-10-CM | POA: Diagnosis not present

## 2024-11-04 ENCOUNTER — Encounter (INDEPENDENT_AMBULATORY_CARE_PROVIDER_SITE_OTHER): Admitting: Ophthalmology

## 2024-11-04 DIAGNOSIS — I10 Essential (primary) hypertension: Secondary | ICD-10-CM

## 2024-11-04 DIAGNOSIS — H353112 Nonexudative age-related macular degeneration, right eye, intermediate dry stage: Secondary | ICD-10-CM | POA: Diagnosis not present

## 2024-11-04 DIAGNOSIS — H35033 Hypertensive retinopathy, bilateral: Secondary | ICD-10-CM

## 2024-11-04 DIAGNOSIS — H43813 Vitreous degeneration, bilateral: Secondary | ICD-10-CM | POA: Diagnosis not present

## 2024-11-04 DIAGNOSIS — H353221 Exudative age-related macular degeneration, left eye, with active choroidal neovascularization: Secondary | ICD-10-CM

## 2024-11-10 ENCOUNTER — Ambulatory Visit: Admitting: Dermatology

## 2024-11-10 ENCOUNTER — Encounter: Payer: Self-pay | Admitting: Dermatology

## 2024-11-10 DIAGNOSIS — L820 Inflamed seborrheic keratosis: Secondary | ICD-10-CM | POA: Diagnosis not present

## 2024-11-10 DIAGNOSIS — L821 Other seborrheic keratosis: Secondary | ICD-10-CM | POA: Diagnosis not present

## 2024-11-10 DIAGNOSIS — C44629 Squamous cell carcinoma of skin of left upper limb, including shoulder: Secondary | ICD-10-CM | POA: Diagnosis not present

## 2024-11-10 DIAGNOSIS — D489 Neoplasm of uncertain behavior, unspecified: Secondary | ICD-10-CM

## 2024-11-10 DIAGNOSIS — Z86007 Personal history of in-situ neoplasm of skin: Secondary | ICD-10-CM | POA: Diagnosis not present

## 2024-11-10 NOTE — Patient Instructions (Addendum)

## 2024-11-10 NOTE — Progress Notes (Signed)
 Follow-Up Visit   Subjective  Amy Gilmore is a 88 y.o. female who presents for the following: Patient states continuous growth bump on neck and states irritation but not itchy. Does not use any creams to that area. Present for 3 weeks. Patient also has a spot on her left upper arm that has been present a couple of weeks. This patient is accompanied in the office by her spouse.   The following portions of the chart were reviewed this encounter and updated as appropriate: medications, allergies, medical history  Review of Systems:  No other skin or systemic complaints except as noted in HPI or Assessment and Plan.  Objective  Well appearing patient in no apparent distress; mood and affect are within normal limits.  A focused examination was performed of the following areas: Neck, chest, left arm, scalp  Relevant exam findings are noted in the Assessment and Plan.  Left anterior neck 4mm brown keratotic papule  Left upper lateral arm 7mm pink scaly papule with hemorraghic crusting   Assessment & Plan   SEBORRHEIC KERATOSIS - Stuck-on, waxy, tan-brown papules and/or plaques at left upper chest.  - Benign-appearing - Discussed benign etiology and prognosis. - Observe - Call for any changes  HISTORY OF SQUAMOUS CELL CARCINOMA IN SITU OF THE SKIN Frontal Scalp within hair part- treated with MOHS with Dr. Paci 10/30/23 - No evidence of recurrence today - Recommend regular full body skin exams - Recommend daily broad spectrum sunscreen SPF 30+ to sun-exposed areas, reapply every 2 hours as needed.  - Call if any new or changing lesions are noted between office visits    NEOPLASM OF UNCERTAIN BEHAVIOR (2) Left anterior neck Skin / nail biopsy Type of biopsy: tangential   Informed consent: discussed and consent obtained   Timeout: patient name, date of birth, surgical site, and procedure verified   Procedure prep:  Patient was prepped and draped in usual sterile  fashion Prep type:  Isopropyl alcohol Anesthesia: the lesion was anesthetized in a standard fashion   Anesthetic:  1% lidocaine  w/ epinephrine 1-100,000 buffered w/ 8.4% NaHCO3 Instrument used: DermaBlade   Hemostasis achieved with: pressure and aluminum chloride   Outcome: patient tolerated procedure well   Post-procedure details: sterile dressing applied and wound care instructions given   Dressing type: bandage and petrolatum    Specimen 1 - Surgical pathology Differential Diagnosis: ISK vs SCC  Check Margins: No 4mm brown keratotic papule Left upper lateral arm Skin / nail biopsy Type of biopsy: tangential   Informed consent: discussed and consent obtained   Timeout: patient name, date of birth, surgical site, and procedure verified   Procedure prep:  Patient was prepped and draped in usual sterile fashion Prep type:  Isopropyl alcohol Anesthesia: the lesion was anesthetized in a standard fashion   Anesthetic:  1% lidocaine  w/ epinephrine 1-100,000 buffered w/ 8.4% NaHCO3 Instrument used: DermaBlade   Hemostasis achieved with: pressure and aluminum chloride   Outcome: patient tolerated procedure well   Post-procedure details: sterile dressing applied and wound care instructions given   Dressing type: bandage and petrolatum    Specimen 2 - Surgical pathology Differential Diagnosis: ISK vs SCC  Check Margins: No 7mm pink scaly papule with hemorraghic crusting SEBORRHEIC KERATOSES    Return for TBSE, HxSCCIS, w/ Dr. Claudene.  IAlmetta Nora, RMA, am acting as scribe for Boneta Claudene, MD .   Documentation: I have reviewed the above documentation for accuracy and completeness, and I agree with the  above.  Boneta Sharps, MD

## 2024-11-12 LAB — SURGICAL PATHOLOGY

## 2024-11-15 ENCOUNTER — Ambulatory Visit: Payer: Self-pay | Admitting: Dermatology

## 2024-11-16 ENCOUNTER — Encounter: Payer: Self-pay | Admitting: Dermatology

## 2024-11-16 NOTE — Telephone Encounter (Signed)
-----   Message from Boneta Sharps, MD sent at 11/15/2024  9:16 PM EST ----- Diagnosis: 1. Skin, left anterior neck :       SEBORRHEIC KERATOSIS, INFLAMED        2. Skin, left upper lateral arm :       WELL DIFFERENTIATED SQUAMOUS CELL CARCINOMA WITH SUPERFICIAL INFILTRATION    Please call to share diagnosis and discuss treatment options.  L neck, biopsy shows benign inflamed thickening of the top layer of skin (ISK), no treatment needed  2. L arm Explanation: Biopsy shows a squamous cell skin cancer that has grown beyond the surface of the skin and is invading the second layer of the skin. It has the potential to spread beyond the skin and  threaten your health, so I recommend treating it.  Treatment option 1: you return for a 15 minute appointment where we perform electrodesiccation and curettage [facility]). This involves three rounds of scraping and burning to destroy the skin cancer. It  has about an 75-85% cure rate and leaves a round wound slightly larger than the skin cancer and leaves a round white scar. No additional pathology is done. If the skin cancer comes back, we would  need to do a surgery to remove it.   Treatment option 2: you return for an hour long appointment where we perform a skin surgery. We numb the site of the skin cancer and a safety margin of normal skin around it. We remove the full  thickness of skin and close the wound with two layers of stitches. The sample is sent to the lab to check that the skin cancer was fully removed. Return one week later to have wound checked and  surface stitches removed. Surgical wound leaves a line scar. Approximately 95% cure rate. Risk of recurrence, bleeding, infection, pain, injury to nearby structures, hypertrophic scar.

## 2024-11-16 NOTE — Telephone Encounter (Signed)
 Spoke with patient's spouse, Christopher and advised of patient's biopsy results. Spouse voiced understanding to all and opted for excision to treat left upper lateral arm. Excision scheduled with Dr. Claudene on 12/30/24.

## 2024-12-15 ENCOUNTER — Ambulatory Visit: Admitting: Dermatology

## 2024-12-15 ENCOUNTER — Encounter: Payer: Self-pay | Admitting: Dermatology

## 2024-12-15 DIAGNOSIS — D1801 Hemangioma of skin and subcutaneous tissue: Secondary | ICD-10-CM

## 2024-12-15 DIAGNOSIS — L821 Other seborrheic keratosis: Secondary | ICD-10-CM

## 2024-12-15 DIAGNOSIS — L578 Other skin changes due to chronic exposure to nonionizing radiation: Secondary | ICD-10-CM

## 2024-12-15 DIAGNOSIS — C44629 Squamous cell carcinoma of skin of left upper limb, including shoulder: Secondary | ICD-10-CM

## 2024-12-15 DIAGNOSIS — L814 Other melanin hyperpigmentation: Secondary | ICD-10-CM

## 2024-12-15 DIAGNOSIS — W908XXA Exposure to other nonionizing radiation, initial encounter: Secondary | ICD-10-CM | POA: Diagnosis not present

## 2024-12-15 DIAGNOSIS — L570 Actinic keratosis: Secondary | ICD-10-CM

## 2024-12-15 DIAGNOSIS — Z1283 Encounter for screening for malignant neoplasm of skin: Secondary | ICD-10-CM

## 2024-12-15 DIAGNOSIS — Z86007 Personal history of in-situ neoplasm of skin: Secondary | ICD-10-CM | POA: Diagnosis not present

## 2024-12-15 DIAGNOSIS — D229 Melanocytic nevi, unspecified: Secondary | ICD-10-CM

## 2024-12-15 DIAGNOSIS — C4492 Squamous cell carcinoma of skin, unspecified: Secondary | ICD-10-CM

## 2024-12-15 NOTE — Progress Notes (Signed)
 "  Follow-Up Visit   Subjective  Amy Gilmore is a 89 y.o. female who presents for the following: Skin Cancer Screening and Full Body Skin Exam  The patient presents for Total-Body Skin Exam (TBSE) for skin cancer screening and mole check. The patient has spots, moles and lesions to be evaluated, some may be new or changing and the patient may have concern these could be cancer.  Hx SCC. Some spots have come up at neck, no itching.   Patient accompanied by husband who contributes to history.  The following portions of the chart were reviewed this encounter and updated as appropriate: medications, allergies, medical history  Review of Systems:  No other skin or systemic complaints except as noted in HPI or Assessment and Plan.  Objective  Well appearing patient in no apparent distress; mood and affect are within normal limits.  A full examination was performed including scalp, head, eyes, ears, nose, lips, neck, chest, axillae, abdomen, back, buttocks, bilateral upper extremities, bilateral lower extremities, hands, feet, fingers, toes, fingernails, and toenails. All findings within normal limits unless otherwise noted below.   Relevant physical exam findings are noted in the Assessment and Plan.  Left Upper Arm Pink bx site R forearm x 4, R dorsal hand x 4, R 2nd finger between IP joints x 1, L dorsal hand x 4, L forearm x 6, L mid cheek x 1 (20) Erythematous thin papules/macules with gritty scale.   Assessment & Plan   SKIN CANCER SCREENING PERFORMED TODAY.  ACTINIC DAMAGE - Chronic condition, secondary to cumulative UV/sun exposure - diffuse scaly erythematous macules with underlying dyspigmentation - Recommend daily broad spectrum sunscreen SPF 30+ to sun-exposed areas, reapply every 2 hours as needed.  - Staying in the shade or wearing long sleeves, sun glasses (UVA+UVB protection) and wide brim hats (4-inch brim around the entire circumference of the hat) are also  recommended for sun protection.  - Call for new or changing lesions.  LENTIGINES, SEBORRHEIC KERATOSES, HEMANGIOMAS - Benign normal skin lesions - Benign-appearing - Call for any changes  MELANOCYTIC NEVI - Tan-brown and/or pink-flesh-colored symmetric macules and papules - Benign appearing on exam today - Observation - Call clinic for new or changing moles - Recommend daily use of broad spectrum spf 30+ sunscreen to sun-exposed areas.   HISTORY OF SQUAMOUS CELL CARCINOMA IN SITU OF THE SKIN Frontal Scalp within hair part- treated with MOHS with Dr. Paci 10/30/23 - No evidence of recurrence today - Recommend regular full body skin exams - Recommend daily broad spectrum sunscreen SPF 30+ to sun-exposed areas, reapply every 2 hours as needed.  - Call if any new or changing lesions are noted between office visits  SQUAMOUS CELL CARCINOMA OF SKIN Left Upper Arm Biopsy proven Patient scheduled for excision 12/30/24 with Dr. Claudene  AK (ACTINIC KERATOSIS) (20) R forearm x 4, R dorsal hand x 4, R 2nd finger between IP joints x 1, L dorsal hand x 4, L forearm x 6, L mid cheek x 1 (20) Actinic keratoses are precancerous spots that appear secondary to cumulative UV radiation exposure/sun exposure over time. They are chronic with expected duration over 1 year. A portion of actinic keratoses will progress to squamous cell carcinoma of the skin. It is not possible to reliably predict which spots will progress to skin cancer and so treatment is recommended to prevent development of skin cancer.  Recommend daily broad spectrum sunscreen SPF 30+ to sun-exposed areas, reapply every 2 hours as needed.  Recommend staying in the shade or wearing long sleeves, sun glasses (UVA+UVB protection) and wide brim hats (4-inch brim around the entire circumference of the hat). Call for new or changing lesions.  Patient advised to RTC if AK at left mid cheek does not resolve - Destruction of lesion - R forearm x 4,  R dorsal hand x 4, R 2nd finger between IP joints x 1, L dorsal hand x 4, L forearm x 6, L mid cheek x 1 (20) Complexity: simple   Destruction method: cryotherapy   Informed consent: discussed and consent obtained   Timeout:  patient name, date of birth, surgical site, and procedure verified Lesion destroyed using liquid nitrogen: Yes   Region frozen until ice ball extended beyond lesion: Yes   Cryo cycles: 1 or 2. Outcome: patient tolerated procedure well with no complications   Post-procedure details: wound care instructions given    MULTIPLE BENIGN NEVI   LENTIGINES   ACTINIC ELASTOSIS   SEBORRHEIC KERATOSES   CHERRY ANGIOMA   Return for Surgery, as scheduled, with Dr. Claudene, 6 month TBSE.  [name] [name], RMA, am acting as scribe for Boneta Claudene, MD .   Documentation: I have reviewed the above documentation for accuracy and completeness, and I agree with the above.  Boneta Claudene, MD   "

## 2024-12-15 NOTE — Patient Instructions (Signed)

## 2024-12-30 ENCOUNTER — Encounter: Payer: Self-pay | Admitting: Dermatology

## 2024-12-30 ENCOUNTER — Ambulatory Visit: Admitting: Dermatology

## 2024-12-30 DIAGNOSIS — C4492 Squamous cell carcinoma of skin, unspecified: Secondary | ICD-10-CM

## 2024-12-30 DIAGNOSIS — C44629 Squamous cell carcinoma of skin of left upper limb, including shoulder: Secondary | ICD-10-CM

## 2024-12-30 MED ORDER — MUPIROCIN 2 % EX OINT: 1.0000 | TOPICAL_OINTMENT | Freq: Every day | CUTANEOUS | 0 refills | Status: AC

## 2024-12-30 NOTE — Progress Notes (Signed)
" ° °  Follow-Up Visit   Subjective  Amy Gilmore is a 89 y.o. female who presents for the following: bx proven SCC of the L upper lat arm, pt here today for excision.  The following portions of the chart were reviewed this encounter and updated as appropriate: medications, allergies, medical history  Review of Systems:  No other skin or systemic complaints except as noted in HPI or Assessment and Plan.  Objective  Well appearing patient in no apparent distress; mood and affect are within normal limits.   A focused examination was performed of the following areas: the L forearm   Relevant exam findings are noted in the Assessment and Plan.  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 - Skin excision  Excision method:  elliptical Lesion length (cm):  0.5 Margin per side (cm):  0.4 Total excision diameter (cm):  1.3 Informed consent: discussed and consent obtained   Timeout: patient name, date of birth, surgical site, and procedure verified   Procedure prep:  Patient was prepped and draped in usual sterile fashion Prep type:  Chlorhexidine Anesthesia: the lesion was anesthetized in a standard fashion   Anesthetic:  1% lidocaine  w/ epinephrine 1-100,000 buffered w/ 8.4% NaHCO3 Instrument used: #15 blade   Hemostasis achieved with: suture, pressure and electrodesiccation   Outcome: patient tolerated procedure well with no complications    - Skin repair Complexity:  Intermediate Final length (cm):  4 Informed consent: discussed and consent obtained   Timeout: patient name, date of birth, surgical site, and procedure verified   Procedure prep:  Patient was prepped and draped in usual sterile fashion Prep type:  Chlorhexidine Anesthesia: the lesion was anesthetized in a standard fashion   Anesthetic:  1% lidocaine  w/ epinephrine 1-100,000 buffered w/ 8.4% NaHCO3 Reason for type of repair: reduce tension to  allow closure, reduce the risk of dehiscence, infection, and necrosis, reduce subcutaneous dead space and avoid a hematoma, allow closure of the large defect and preserve normal anatomy   Undermining: edges could be approximated without difficulty   Subcutaneous layers (deep stitches):  Suture size:  4-0 Suture type: Vicryl (polyglactin 910)   Stitches:  Buried vertical mattress Fine/surface layer approximation (top stitches):  Suture size:  5-0 Suture type: Prolene (polypropylene)   Stitches: simple interrupted and simple running   Hemostasis achieved with: suture, pressure and electrodesiccation Outcome: patient tolerated procedure well with no complications   Post-procedure details: sterile dressing applied and wound care instructions given   Dressing type: petrolatum, bandage and pressure dressing    Specimen 1 - Surgical pathology Differential Diagnosis: BX proven [phone] Superior tag Check Margins: yes [id number]  Return in 13 days (on 01/12/2025) for suture removal in 12-14 days.  Amy Gilmore, CMA, am acting as scribe for Boneta Sharps, MD .   Documentation: I have reviewed the above documentation for accuracy and completeness, and I agree with the above.  Boneta Sharps, MD    "

## 2024-12-30 NOTE — Patient Instructions (Signed)

## 2025-01-01 LAB — SURGICAL PATHOLOGY

## 2025-01-04 ENCOUNTER — Ambulatory Visit: Payer: Self-pay | Admitting: Dermatology

## 2025-01-05 ENCOUNTER — Encounter: Payer: Self-pay | Admitting: Dermatology

## 2025-01-05 NOTE — Telephone Encounter (Signed)
-----   Message from Boneta Sharps, MD sent at 01/04/2025  6:46 PM EST ----- Diagnosis L upper lat arm :       NO RESIDUAL SQUAMOUS CELL CARCINOMA, MARGINS FREE   Please call to share that excision was clear of skin cancer and get update on surgical wound. Thank you.

## 2025-01-05 NOTE — Telephone Encounter (Signed)
 Spoke with patient and advised of results, patient denies any issues at surgical site, states she is doing fine.

## 2025-01-12 ENCOUNTER — Encounter: Admitting: Dermatology

## 2025-01-13 ENCOUNTER — Encounter (INDEPENDENT_AMBULATORY_CARE_PROVIDER_SITE_OTHER): Admitting: Ophthalmology

## 2025-04-20 ENCOUNTER — Ambulatory Visit: Admitting: Nurse Practitioner

## 2025-06-14 ENCOUNTER — Ambulatory Visit: Admitting: Dermatology

## 2025-10-22 ENCOUNTER — Ambulatory Visit: Admitting: Nurse Practitioner
